# Patient Record
Sex: Female | Born: 1960 | Race: White | Hispanic: No | Marital: Single | Smoking: Never smoker
Health system: Southern US, Community
[De-identification: ages and names within clinical notes are randomized; demographics above are authoritative.]

## PROBLEM LIST (undated history)

## (undated) DIAGNOSIS — F329 Major depressive disorder, single episode, unspecified: Secondary | ICD-10-CM

## (undated) DIAGNOSIS — J040 Acute laryngitis: Secondary | ICD-10-CM

## (undated) DIAGNOSIS — M1990 Unspecified osteoarthritis, unspecified site: Secondary | ICD-10-CM

## (undated) DIAGNOSIS — I517 Cardiomegaly: Secondary | ICD-10-CM

## (undated) DIAGNOSIS — R091 Pleurisy: Secondary | ICD-10-CM

## (undated) DIAGNOSIS — Z8669 Personal history of other diseases of the nervous system and sense organs: Secondary | ICD-10-CM

## (undated) DIAGNOSIS — E785 Hyperlipidemia, unspecified: Secondary | ICD-10-CM

## (undated) DIAGNOSIS — H9191 Unspecified hearing loss, right ear: Secondary | ICD-10-CM

## (undated) DIAGNOSIS — R42 Dizziness and giddiness: Secondary | ICD-10-CM

## (undated) DIAGNOSIS — G4733 Obstructive sleep apnea (adult) (pediatric): Secondary | ICD-10-CM

## (undated) DIAGNOSIS — S92911A Unspecified fracture of right toe(s), initial encounter for closed fracture: Secondary | ICD-10-CM

## (undated) DIAGNOSIS — T7840XA Allergy, unspecified, initial encounter: Secondary | ICD-10-CM

## (undated) DIAGNOSIS — IMO0001 Reserved for inherently not codable concepts without codable children: Secondary | ICD-10-CM

## (undated) DIAGNOSIS — M545 Low back pain, unspecified: Secondary | ICD-10-CM

## (undated) DIAGNOSIS — R531 Weakness: Secondary | ICD-10-CM

## (undated) DIAGNOSIS — Z9889 Other specified postprocedural states: Secondary | ICD-10-CM

## (undated) DIAGNOSIS — M62838 Other muscle spasm: Secondary | ICD-10-CM

## (undated) DIAGNOSIS — G8929 Other chronic pain: Secondary | ICD-10-CM

## (undated) DIAGNOSIS — Z9989 Dependence on other enabling machines and devices: Secondary | ICD-10-CM

## (undated) DIAGNOSIS — R0602 Shortness of breath: Secondary | ICD-10-CM

## (undated) DIAGNOSIS — G459 Transient cerebral ischemic attack, unspecified: Secondary | ICD-10-CM

## (undated) DIAGNOSIS — K449 Diaphragmatic hernia without obstruction or gangrene: Secondary | ICD-10-CM

## (undated) DIAGNOSIS — F32A Depression, unspecified: Secondary | ICD-10-CM

## (undated) DIAGNOSIS — F3181 Bipolar II disorder: Secondary | ICD-10-CM

## (undated) DIAGNOSIS — K589 Irritable bowel syndrome without diarrhea: Secondary | ICD-10-CM

## (undated) DIAGNOSIS — H9190 Unspecified hearing loss, unspecified ear: Secondary | ICD-10-CM

## (undated) DIAGNOSIS — N201 Calculus of ureter: Secondary | ICD-10-CM

## (undated) DIAGNOSIS — G4700 Insomnia, unspecified: Secondary | ICD-10-CM

## (undated) DIAGNOSIS — B349 Viral infection, unspecified: Secondary | ICD-10-CM

## (undated) DIAGNOSIS — F419 Anxiety disorder, unspecified: Secondary | ICD-10-CM

## (undated) DIAGNOSIS — R112 Nausea with vomiting, unspecified: Secondary | ICD-10-CM

## (undated) DIAGNOSIS — R350 Frequency of micturition: Secondary | ICD-10-CM

## (undated) DIAGNOSIS — F988 Other specified behavioral and emotional disorders with onset usually occurring in childhood and adolescence: Secondary | ICD-10-CM

## (undated) DIAGNOSIS — J189 Pneumonia, unspecified organism: Secondary | ICD-10-CM

## (undated) DIAGNOSIS — Z8781 Personal history of (healed) traumatic fracture: Secondary | ICD-10-CM

## (undated) HISTORY — PX: COLONOSCOPY: SHX174

## (undated) HISTORY — DX: Dependence on other enabling machines and devices: Z99.89

## (undated) HISTORY — PX: OTHER SURGICAL HISTORY: SHX169

## (undated) HISTORY — PX: COCHLEAR IMPLANT: SUR684

## (undated) HISTORY — PX: UPPER GASTROINTESTINAL ENDOSCOPY: SHX188

## (undated) HISTORY — DX: Depression, unspecified: F32.A

## (undated) HISTORY — DX: Hyperlipidemia, unspecified: E78.5

## (undated) HISTORY — PX: ELBOW FRACTURE SURGERY: SHX616

## (undated) HISTORY — DX: Anxiety disorder, unspecified: F41.9

## (undated) HISTORY — DX: Diaphragmatic hernia without obstruction or gangrene: K44.9

## (undated) HISTORY — PX: ABDOMINAL HYSTERECTOMY: SHX81

## (undated) HISTORY — DX: Major depressive disorder, single episode, unspecified: F32.9

## (undated) HISTORY — PX: BUNIONECTOMY: SHX129

## (undated) HISTORY — PX: ABDOMINAL HERNIA REPAIR: SHX539

## (undated) HISTORY — DX: Obstructive sleep apnea (adult) (pediatric): G47.33

## (undated) HISTORY — DX: Allergy, unspecified, initial encounter: T78.40XA

---

## 1998-08-25 ENCOUNTER — Inpatient Hospital Stay (HOSPITAL_COMMUNITY): Admission: AD | Admit: 1998-08-25 | Discharge: 1998-08-27 | Payer: Self-pay | Admitting: Gynecology

## 2000-03-08 ENCOUNTER — Emergency Department (HOSPITAL_COMMUNITY): Admission: EM | Admit: 2000-03-08 | Discharge: 2000-03-08 | Payer: Self-pay | Admitting: Emergency Medicine

## 2000-04-11 ENCOUNTER — Ambulatory Visit (HOSPITAL_COMMUNITY): Admission: RE | Admit: 2000-04-11 | Discharge: 2000-04-11 | Payer: Self-pay | Admitting: Internal Medicine

## 2001-11-09 ENCOUNTER — Emergency Department (HOSPITAL_COMMUNITY): Admission: EM | Admit: 2001-11-09 | Discharge: 2001-11-09 | Payer: Self-pay | Admitting: Emergency Medicine

## 2001-11-09 ENCOUNTER — Encounter: Payer: Self-pay | Admitting: Emergency Medicine

## 2006-01-30 ENCOUNTER — Emergency Department (HOSPITAL_COMMUNITY): Admission: EM | Admit: 2006-01-30 | Discharge: 2006-01-30 | Payer: Self-pay | Admitting: Emergency Medicine

## 2007-06-30 ENCOUNTER — Emergency Department (HOSPITAL_COMMUNITY): Admission: EM | Admit: 2007-06-30 | Discharge: 2007-06-30 | Payer: Self-pay | Admitting: Emergency Medicine

## 2007-07-01 DIAGNOSIS — Z8781 Personal history of (healed) traumatic fracture: Secondary | ICD-10-CM

## 2007-07-01 HISTORY — DX: Personal history of (healed) traumatic fracture: Z87.81

## 2007-07-02 ENCOUNTER — Ambulatory Visit (HOSPITAL_COMMUNITY): Admission: RE | Admit: 2007-07-02 | Discharge: 2007-07-02 | Payer: Self-pay | Admitting: Otolaryngology

## 2009-06-17 ENCOUNTER — Encounter (INDEPENDENT_AMBULATORY_CARE_PROVIDER_SITE_OTHER): Payer: Self-pay | Admitting: Gynecology

## 2009-06-17 ENCOUNTER — Ambulatory Visit (HOSPITAL_BASED_OUTPATIENT_CLINIC_OR_DEPARTMENT_OTHER): Admission: RE | Admit: 2009-06-17 | Discharge: 2009-06-18 | Payer: Self-pay | Admitting: Gynecology

## 2010-04-04 ENCOUNTER — Encounter: Admission: RE | Admit: 2010-04-04 | Discharge: 2010-04-04 | Payer: Self-pay | Admitting: Gynecology

## 2010-04-18 DIAGNOSIS — F329 Major depressive disorder, single episode, unspecified: Secondary | ICD-10-CM | POA: Insufficient documentation

## 2010-04-18 DIAGNOSIS — F900 Attention-deficit hyperactivity disorder, predominantly inattentive type: Secondary | ICD-10-CM | POA: Insufficient documentation

## 2010-04-18 DIAGNOSIS — N3010 Interstitial cystitis (chronic) without hematuria: Secondary | ICD-10-CM | POA: Insufficient documentation

## 2010-04-18 DIAGNOSIS — G4700 Insomnia, unspecified: Secondary | ICD-10-CM | POA: Insufficient documentation

## 2010-04-18 DIAGNOSIS — K449 Diaphragmatic hernia without obstruction or gangrene: Secondary | ICD-10-CM | POA: Insufficient documentation

## 2010-04-18 DIAGNOSIS — K589 Irritable bowel syndrome without diarrhea: Secondary | ICD-10-CM | POA: Insufficient documentation

## 2010-05-12 DIAGNOSIS — R209 Unspecified disturbances of skin sensation: Secondary | ICD-10-CM | POA: Insufficient documentation

## 2011-01-08 ENCOUNTER — Encounter (HOSPITAL_BASED_OUTPATIENT_CLINIC_OR_DEPARTMENT_OTHER): Payer: Self-pay | Admitting: Internal Medicine

## 2011-02-23 ENCOUNTER — Encounter: Payer: Self-pay | Admitting: Internal Medicine

## 2011-02-28 NOTE — Letter (Signed)
Summary: Pre Visit Letter Revised  [facility]  [address] [place], [place] [address]   Phone: [phone]  Fax: [phone]        02/23/2011 MRN: [id number]  [facility] [address] [place], [place]  [address]             Procedure Date:  03-31-11 10:30am           Dr Juanda Chance  Direct Colon   Welcome to the Gastroenterology Division at [facility].    You are scheduled to see a nurse for your pre-procedure visit on 03-17-11 at 10am on the 3rd floor at [facility], [address].  We ask that you try to arrive at our office 15 minutes prior to your appointment time to allow for check-in.  Please take a minute to review the attached form.  If you answer "Yes" to one or more of the questions on the first page, we ask that you call the person listed at your earliest opportunity.  If you answer "No" to all of the questions, please complete the rest of the form and bring it to your appointment.    Your nurse visit will consist of discussing your medical and surgical history, your immediate family medical history, and your medications.   If you are unable to list all of your medications on the form, please bring the medication bottles to your appointment and we will list them.  We will need to be aware of both prescribed and over the counter drugs.  We will need to know exact dosage information as well.    Please be prepared to read and sign documents such as consent forms, a financial agreement, and acknowledgement forms.  If necessary, and with your consent, a friend or relative is welcome to sit-in on the nurse visit with you.  Please bring your insurance card so that we may make a copy of it.  If your insurance requires a referral to see a specialist, please bring your referral form from your primary care physician.  No co-pay is required for this nurse visit.     If you cannot keep your appointment, please call [phone] to cancel or reschedule  prior to your appointment date.  This allows [place] the opportunity to schedule an appointment for another patient in need of care.    Thank you for choosing [facility] for your medical needs.  We appreciate the opportunity to care for you.  Please visit [place] at our website  to learn more about our practice.  Sincerely, The Gastroenterology Division

## 2011-03-29 ENCOUNTER — Ambulatory Visit (AMBULATORY_SURGERY_CENTER): Payer: BLUE CROSS/BLUE SHIELD | Admitting: *Deleted

## 2011-03-29 VITALS — Ht 68.0 in | Wt 197.6 lb

## 2011-03-29 DIAGNOSIS — Z800 Family history of malignant neoplasm of digestive organs: Secondary | ICD-10-CM

## 2011-03-29 MED ORDER — PEG-KCL-NACL-NASULF-NA ASC-C 100 G PO SOLR: 1.0000 | Freq: Once | ORAL | Status: AC

## 2011-03-31 ENCOUNTER — Other Ambulatory Visit: Payer: Self-pay | Admitting: Internal Medicine

## 2011-04-07 ENCOUNTER — Encounter: Payer: Self-pay | Admitting: Internal Medicine

## 2011-04-10 ENCOUNTER — Ambulatory Visit (AMBULATORY_SURGERY_CENTER): Payer: BLUE CROSS/BLUE SHIELD | Admitting: Internal Medicine

## 2011-04-10 ENCOUNTER — Encounter: Payer: Self-pay | Admitting: Internal Medicine

## 2011-04-10 VITALS — BP 135/84 | HR 57 | Temp 98.6°F | Resp 16 | Ht 68.0 in | Wt 190.0 lb

## 2011-04-10 DIAGNOSIS — Z1211 Encounter for screening for malignant neoplasm of colon: Secondary | ICD-10-CM

## 2011-04-10 MED ORDER — SODIUM CHLORIDE 0.9 % IV SOLN: 500.0000 mL | INTRAVENOUS | Status: DC

## 2011-04-10 NOTE — Patient Instructions (Signed)
DISCHARGED INSTRUCTIONS GIVEN WITH VERBAL understanding. Normal examination. Resume previous medications.

## 2011-04-11 ENCOUNTER — Telehealth: Payer: Self-pay | Admitting: *Deleted

## 2011-04-11 NOTE — Telephone Encounter (Signed)
Left message on cell # to call if needs anything.
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
## 2011-05-02 NOTE — Op Note (Signed)
Natalie Le, Natalie Le              ACCOUNT NO.:  [id number]   MEDICAL RECORD NO.:  [id number]          PATIENT TYPE:  AMB   LOCATION:  NESC                         FACILITY:  [facility]   PHYSICIAN:  Gretta Cool, M.D. DATE OF BIRTH:  1961/10/21   DATE OF PROCEDURE:  06/17/2009  DATE OF DISCHARGE:                               OPERATIVE REPORT   PREOPERATIVE DIAGNOSIS:  Uterine leiomyomata recurrent post myomectomy  with abnormal uterine bleeding.   POSTOPERATIVE DIAGNOSES:  Uterine leiomyomata recurrent post myomectomy  with abnormal uterine bleeding  plus significant ovarian adhesions and minimal endometriosis  Involving the peritubal area on the left and a cul-de-sac.   PROCEDURE:  Supracervical hysterectomy including all of the endocervical  canal and bilateral salpingo-oophorectomy.   SURGEON:  Gretta Cool, M.D.   ASSISTANT:  Miguel Aschoff, M.D.   INDICATION:  A 50 year old perimenopausal female with menses varying  from 2 to 10 days, occasionally associated with extremely heavy flow in  clots.  Her uterus is enlarged now to approximately 12 to 14 weeks size  with multiple fibroids encroaching the cavity.  She also has extensive  areas that appear to be adenomyosis with significant premenstrual pain  on Saturday prior to menses.  She is now admitted for definitive therapy  by supracervical hysterectomy, possible salpingo-oophorectomy.  She  understands the risk of adhesions from her previous surgery involving  the ovaries, endometriosis often associated with interstitial cystitis  that she has been diagnosed with, now admitted for definitive therapy.   ANESTHESIA:  General orotracheal.   DESCRIPTION OF PROCEDURE:  Under excellent general anesthesia  orotracheal with the patient prepped in the supine position with  compression stockings on her legs and a Foley catheter draining her  bladder, a Pfannenstiel incision was made by excision of her previous  scar.  The  incision was then extended through the fascia.  The rectus  muscles were separated in the midline and the peritoneum opened.  There  were no abnormalities identified in the peritoneal cavity.  Her appendix  was retrocecal, not removed.  Both kidneys, gallbladder and liver  appeared normal.  There were significant adhesions from her ovary to the  posterior aspect of her uterus along with fallopian tube.  There was  minimal endometriosis involving the left fallopian tube and cul-de-sac.  The right ovary appeared small and nonfunctional.  At this point,  decision was made to proceed with supracervical hysterectomy and  bilateral salpingo-oophorectomy.  The round ligaments were transected by  cautery.  The infundibulopelvic ligaments were then opened, clamped with  Masterson clamps, transected and doubly ligated with 0 Vicryl.  At this  point, the bladder was mobilized off the uterine segment.  The broad  ligament was also skeletonized.  The uterine vessels were then clamped,  cut, sutured and tied with 0 Vicryl.  The upper portion of the current  ligament was then also clamped, cut, sutured and tied with 0 Vicryl.  At  this point, a conical incision was made in the cervix, and a  supracervical hysterectomy done conserving the fascia and stroma of the  cervix  with virtually complete excision of the endocervical canal.  At  this point, the cervix was closed with a running suture of 0 Vicryl.  At  this point, all the bleeding was dried and controlled.  At this point,  the pelvis was irrigated with lactated Ringer's and then the peritoneum  of the pelvis reapproximated with running suture of 2-0 Monocryl.  At  this point, the packs and retractors were removed.  The abdominal  peritoneum was closed with running suture of 0 Vicryl and Monocryl.  The  rectus muscle was then ligated in the midline also with 0 Monocryl.  At  this point, the fascia was closed from each angle of the midline with a   running suture of #0 Vicryl.  Subcutaneous tissue was approximated with  interrupted sutures of 3-0 Vicryl and the skin closed with skin staples  and Steri-Strips.  At the end of the procedure, sponge and lap counts  were correct.  There were no complications.  The patient was returned to  the recovery room in excellent condition.           ______________________________  Gretta Cool, M.D.     CWL/MEDQ  D:  06/17/2009  T:  06/17/2009  Job:  [id number]   cc:   Dr. Merry Lofty, M.D.

## 2011-05-03 ENCOUNTER — Other Ambulatory Visit: Payer: Self-pay | Admitting: Gynecology

## 2011-06-12 DIAGNOSIS — H9190 Unspecified hearing loss, unspecified ear: Secondary | ICD-10-CM | POA: Insufficient documentation

## 2011-06-19 ENCOUNTER — Other Ambulatory Visit: Payer: Self-pay | Admitting: Otolaryngology

## 2011-06-24 ENCOUNTER — Ambulatory Visit
Admission: RE | Admit: 2011-06-24 | Discharge: 2011-06-24 | Disposition: A | Discharge status: Home / Self Care | Payer: BLUE CROSS/BLUE SHIELD | Source: Ambulatory Visit | Attending: Otolaryngology | Admitting: Otolaryngology

## 2011-06-24 MED ORDER — GADOBENATE DIMEGLUMINE 529 MG/ML IV SOLN: 20.0000 mL | Freq: Once | INTRAVENOUS | Status: AC | PRN

## 2011-07-13 ENCOUNTER — Other Ambulatory Visit (HOSPITAL_COMMUNITY): Payer: Self-pay | Admitting: Otolaryngology

## 2011-07-13 DIAGNOSIS — Q211 Atrial septal defect: Secondary | ICD-10-CM

## 2011-07-14 ENCOUNTER — Other Ambulatory Visit (HOSPITAL_COMMUNITY): Payer: BLUE CROSS/BLUE SHIELD | Admitting: Radiology

## 2011-07-27 ENCOUNTER — Other Ambulatory Visit: Payer: Self-pay | Admitting: Internal Medicine

## 2011-07-27 DIAGNOSIS — N63 Unspecified lump in unspecified breast: Secondary | ICD-10-CM

## 2011-08-03 ENCOUNTER — Ambulatory Visit
Admission: RE | Admit: 2011-08-03 | Discharge: 2011-08-03 | Disposition: A | Discharge status: Home / Self Care | Payer: BLUE CROSS/BLUE SHIELD | Source: Ambulatory Visit | Attending: Internal Medicine | Admitting: Internal Medicine

## 2011-08-03 ENCOUNTER — Other Ambulatory Visit: Payer: Self-pay | Admitting: Internal Medicine

## 2011-08-03 DIAGNOSIS — N63 Unspecified lump in unspecified breast: Secondary | ICD-10-CM

## 2011-10-02 LAB — CBC
HCT: 37.9
Hemoglobin: 12.6
MCHC: 33.4
RDW: 14.7 — ABNORMAL HIGH

## 2012-04-09 ENCOUNTER — Observation Stay (HOSPITAL_COMMUNITY): Payer: BC Managed Care – PPO

## 2012-04-09 ENCOUNTER — Observation Stay (HOSPITAL_COMMUNITY)
Admission: EM | Admit: 2012-04-09 | Discharge: 2012-04-10 | Disposition: A | Discharge status: Home / Self Care | Payer: BC Managed Care – PPO | Attending: Emergency Medicine | Admitting: Emergency Medicine

## 2012-04-09 ENCOUNTER — Encounter (HOSPITAL_COMMUNITY): Payer: Self-pay | Admitting: *Deleted

## 2012-04-09 DIAGNOSIS — M542 Cervicalgia: Secondary | ICD-10-CM

## 2012-04-09 DIAGNOSIS — R6884 Jaw pain: Secondary | ICD-10-CM

## 2012-04-09 DIAGNOSIS — R42 Dizziness and giddiness: Secondary | ICD-10-CM

## 2012-04-09 DIAGNOSIS — F3189 Other bipolar disorder: Secondary | ICD-10-CM | POA: Insufficient documentation

## 2012-04-09 DIAGNOSIS — F411 Generalized anxiety disorder: Secondary | ICD-10-CM | POA: Insufficient documentation

## 2012-04-09 DIAGNOSIS — R0602 Shortness of breath: Secondary | ICD-10-CM | POA: Insufficient documentation

## 2012-04-09 DIAGNOSIS — I959 Hypotension, unspecified: Principal | ICD-10-CM | POA: Insufficient documentation

## 2012-04-09 HISTORY — DX: Unspecified hearing loss, unspecified ear: H91.90

## 2012-04-09 HISTORY — DX: Bipolar II disorder: F31.81

## 2012-04-09 HISTORY — DX: Reserved for inherently not codable concepts without codable children: IMO0001

## 2012-04-09 LAB — CARDIAC PANEL(CRET KIN+CKTOT+MB+TROPI)
CK, MB: 1.4 ng/mL (ref 0.3–4.0)
Relative Index: INVALID (ref 0.0–2.5)
Relative Index: INVALID (ref 0.0–2.5)
Total CK: 76 U/L (ref 7–177)
Total CK: 68 U/L (ref 7–177)
Troponin I: <0.3 ng/mL (ref <0.30)

## 2012-04-09 LAB — BASIC METABOLIC PANEL
Calcium: 9.4 mg/dL (ref 8.4–10.5)
GFR calc non Af Amer: 79 mL/min — ABNORMAL LOW (ref >90)
Sodium: 138 mEq/L (ref 135–145)

## 2012-04-09 LAB — POCT I-STAT TROPONIN I: Troponin i, poc: 0 ng/mL (ref 0.00–0.08)

## 2012-04-09 LAB — CBC
MCH: 30.2 pg (ref 26.0–34.0)
MCHC: 33.6 g/dL (ref 30.0–36.0)
Platelets: 305 10*3/uL (ref 150–400)

## 2012-04-09 MED ORDER — ASPIRIN 325 MG PO TABS
325.0000 mg | ORAL_TABLET | ORAL | Status: AC
  Administered 2012-04-09: 325 mg via ORAL
  Filled 2012-04-09: qty 1

## 2012-04-09 NOTE — ED Provider Notes (Signed)
6:32 PM Patient is in CDU under observation, chest pain protocol. This is a shared visit with Dr Patria Mane. Patient reports she is feeling well at present, did develop nausea during my exam. States she has had multiple symptoms over the past several months including unexplained SOB, weakness, right jaw and right neck pain, dizziness. Pt has also had "tightness" around the base of her neck, upper chest. On exam, pt is A&Ox4, NAD, RRR, no m/r/g, CTAB, abd soft, NT, extremities without edema, distal pulses intact and equal bilaterally. Patient is to have coronary CT in the morning. Patient verbalizes understanding and agrees with plan. PCP Dr Eloise Harman, [facility].    11:09 PM Patient reports she has been intermittently dizzy but denies CP, SOB.  On exam, pt is A&Ox4, NAD, RRR, no m/r/g, CTAB, abd soft, NT, extremities without edema, distal pulses intact and equal bilaterally.  Plan is for Coronary CT in the morning.  PCP is Dr Eloise Harman, [facility].    11:49 PM Patient signed out to Dr Hyman Hopes (blue side) who assumes care of her overnight.  Patient to have Coronary CT in the morning.    Dillard Cannon [place], [place] 04/09/12 [id number]

## 2012-04-09 NOTE — ED Notes (Signed)
Patient reports she had elevated heartrate and low bp while at the md office.  She states last night she was dizzy with nausea and headache.  Patient states she had shoulder and neck pain.  Patient states she had intermittent chest pain as well.  Patient was seen at her ear doctor and sent to ED for further eval

## 2012-04-09 NOTE — ED Notes (Signed)
PT BMI IS 27.4

## 2012-04-09 NOTE — ED Notes (Signed)
Pt st's she went to MD's office for a follow up visit and was told to come to ED ref. Hypotension.  Pt denies any chest pain now but st's she has had some off and on

## 2012-04-09 NOTE — ED Provider Notes (Signed)
History   This chart was scribed for Lyanne Co, MD by Clarita Crane. The patient was seen in room CDU10/CDU10. Patient's care was started at 1408.    CSN: [id number]  Arrival date & time 04/09/12  1408   None     Chief Complaint  Patient presents with  . Hypotension    (Consider location/radiation/quality/duration/timing/severity/associated sxs/prior treatment) HPI Natalie Le is a 51 y.o. female who presents to the Emergency Department complaining of intermittent episodes of right sided jaw pain and neck pain with associated intermittent dizziness described as disequilibrium, fatigue onset last night and persistent since. Patient notes she was being evaluated by PCP this morning and was referred to ED by PCP when she began experiencing a brief episode of SOB and chest pain while in office. Patient states she had multiple blood pressure measurements taken at that time which were elevated and multiple measurements which exhibited hypotension. Denies numbness, tingling, diaphoresis, nausea, vomiting. Patient is a non-smoker.  Past Medical History  Diagnosis Date  . Allergy   . Anxiety   . Depression   . Hiatal hernia   . Hearing impaired   . Bipolar 2 disorder     Past Surgical History  Procedure Date  . Colonoscopy   . Abdominal hysterectomy   . Bunionectomy   . Fx nose repair   . Elbow fracture surgery   . Abdominal hernia repair   . Bladder  stretch   . Upper gastrointestinal endoscopy     Family History  Problem Relation Age of Onset  . Colon cancer Maternal Grandfather     History  Substance Use Topics  . Smoking status: Never Smoker   . Smokeless tobacco: Never Used  . Alcohol Use: No    OB History    Grav Para Term Preterm Abortions TAB SAB Ect Mult Living                  Review of Systems  Constitutional: Positive for fatigue. Negative for fever and chills.  HENT: Positive for neck pain.        Jaw pain.   Respiratory: Positive for  shortness of breath.   Cardiovascular: Positive for chest pain.  Gastrointestinal: Negative for nausea and vomiting.  Neurological: Positive for dizziness.    Allergies  Demerol and Lamictal  Home Medications   Current Outpatient Rx  Name Route Sig Dispense Refill  . AMPHETAMINE-DEXTROAMPHET ER 30 MG PO CP24 Oral Take 30 mg by mouth every morning.    . ASPIRIN EC 81 MG PO TBEC Oral Take 81 mg by mouth daily.    Marland Kitchen CALCIUM CARBONATE-VITAMIN D 500-200 MG-UNIT PO TABS Oral Take 2 tablets by mouth 2 (two) times daily.     Marland Kitchen FLUOXETINE HCL 20 MG PO TABS Oral Take 20 mg by mouth every morning.    . TOPIRAMATE 25 MG PO CPSP Oral Take 50 mg by mouth at bedtime.     . TRAZODONE HCL 50 MG PO TABS Oral Take 12.5-25 mg by mouth at bedtime.       BP 129/92  Pulse 82  Temp(Src) 97.6 F (36.4 C) (Oral)  Resp 18  Ht 5' 7.5" (1.715 m)  Wt 169 lb (76.658 kg)  BMI 26.08 kg/m2  SpO2 98%  Physical Exam  Nursing note and vitals reviewed. Constitutional: She is oriented to person, place, and time. She appears well-developed and well-nourished. No distress.  HENT:  Head: Normocephalic and atraumatic.  Eyes: Conjunctivae and EOM are normal.  Pupils are equal, round, and reactive to light.  Neck: Neck supple. No tracheal deviation present.  Cardiovascular: Normal rate, regular rhythm and normal heart sounds.  Exam reveals no gallop and no friction rub.   No murmur heard. Pulmonary/Chest: Effort normal and breath sounds normal. No respiratory distress. She has no wheezes. She has no rales.  Abdominal: Soft. She exhibits no distension.  Musculoskeletal: Normal range of motion.  Neurological: She is alert and oriented to person, place, and time. She has normal strength. No sensory deficit. She displays no seizure activity.  Skin: Skin is warm and dry.  Psychiatric: Her behavior is normal. Her mood appears anxious.    ED Course  Procedures (including critical care time)  DIAGNOSTIC  STUDIES: Oxygen Saturation is 98% on room air, normal by my interpretation.     Date: 04/09/2012  Rate: 67  Rhythm: normal sinus rhythm  QRS Axis: normal  Intervals: normal  ST/T Wave abnormalities: normal  Conduction Disutrbances:none  Narrative Interpretation: Normal EKG  Old EKG Reviewed: none available   COORDINATION OF CARE: 4:12PM-Patient informed of current plan for treatment and evaluation and agrees with plan at this time.     Labs Reviewed  BASIC METABOLIC PANEL - Abnormal; Notable for the following:    GFR calc non Af Amer 79 (*)    All other components within normal limits  CBC  POCT I-STAT TROPONIN I  CARDIAC PANEL(CRET KIN+CKTOT+MB+TROPI)  CARDIAC PANEL(CRET KIN+CKTOT+MB+TROPI)   Dg Chest 2 View  04/09/2012  *RADIOLOGY REPORT*  Clinical Data: Chest pain and dizziness.  Fatigue  CHEST - 2 VIEW  Comparison: None  Findings: The heart size and mediastinal contours are within normal limits.  Both lungs are clear.  The visualized skeletal structures are unremarkable.  IMPRESSION: Negative exam.  Original Report Authenticated By: Rosealee Albee, M.D.     No diagnosis found.    MDM  The patient's symptoms of intermittent right-sided neck pain and jaw pain as well as shortness of breath may represent angina.  She reports normal stress test before in the past.  CT angiogram only obtained in the morning after serial markers through the night.      I personally performed the services described in this documentation, which was scribed in my presence. The recorded information has been reviewed and considered.      Lyanne Co, MD 04/09/12 [phone]

## 2012-04-10 ENCOUNTER — Observation Stay (HOSPITAL_COMMUNITY)
Admit: 2012-04-10 | Discharge: 2012-04-10 | Disposition: A | Discharge status: Home / Self Care | Payer: BC Managed Care – PPO | Attending: Emergency Medicine | Admitting: Emergency Medicine

## 2012-04-10 MED ORDER — METOPROLOL TARTRATE 25 MG PO TABS
50.0000 mg | ORAL_TABLET | Freq: Once | ORAL | Status: AC
  Administered 2012-04-10: 50 mg via ORAL

## 2012-04-10 MED ORDER — NITROGLYCERIN 0.4 MG SL SUBL
0.4000 mg | SUBLINGUAL_TABLET | Freq: Once | SUBLINGUAL | Status: AC
  Administered 2012-04-10: 0.4 mg via SUBLINGUAL

## 2012-04-10 MED ORDER — IOHEXOL 350 MG/ML SOLN
80.0000 mL | Freq: Once | INTRAVENOUS | Status: AC | PRN
  Administered 2012-04-10: 80 mL via INTRAVENOUS

## 2012-04-10 MED ORDER — NITROGLYCERIN 0.4 MG SL SUBL
SUBLINGUAL_TABLET | SUBLINGUAL | Status: AC
  Filled 2012-04-10: qty 25

## 2012-04-10 NOTE — ED Provider Notes (Signed)
I was the primary provider of this patient during this ER visit. The patients care was continued in the CDU and managed in conjunction with my midlevel providers   Lyanne Co, MD 04/10/12 [phone]

## 2012-04-10 NOTE — ED Provider Notes (Signed)
7:20 AM Patient  placed in CDU on chest pain protocol by Dr. Patria Mane. Patient care resumed from Dr. Hyman Hopes .  Patient is awaiting CT coronary arteries this morning.   While in obeservation over night the pt slept well and had no complaints, per nursing staff. Patient re-evaluated and is resting comfortable, VSS, with no new complaints or concerns at this time. Patient denies chest pain, SOB, jaw pain, or dizziness at this time.  Plan per previous provider is to discharge patient home if coronary artery CT is normal. On exam: hemodynamically stable, NAD, heart w/ RRR, lungs CTAB, Chest & abd non-tender, no peripheral edema or calf tenderness.   10:54 AM Patient asymptomatic at this time.  Results of the CT coronary as follows: 1. No atherosclerotic coronary artery disease.  2. Total coronary artery calcium score is zero , which is zero  percentile for patient's matched age and gender.  Will discharge patient home.  Patient instructed to follow up with PCP.  Patient in agreement with plan.     Natalie Le [place], PA-C 04/10/12 [phone]

## 2012-04-10 NOTE — Discharge Instructions (Signed)
Read instructions below for reasons to return to the Emergency Department. It is recommended that your follow up with your Primary Care Doctor in regards to today's visit. Follow up tomorrow as already scheduled.  If you do not have a doctor, use the resource guide listed below to help you find one.   Chest Pain (Nonspecific)  HOME CARE INSTRUCTIONS  For the next few days, avoid physical activities that bring on chest pain. Continue physical activities as directed.  Do not smoke cigarettes or drink alcohol until your symptoms are gone.  Only take over-the-counter or prescription medicine for pain, discomfort, or fever as directed by your caregiver.  Follow your caregiver's suggestions for further testing if your chest pain does not go away.  Keep any follow-up appointments you made. If you do not go to an appointment, you could develop lasting (chronic) problems with pain. If there is any problem keeping an appointment, you must call to reschedule.  SEEK MEDICAL CARE IF:  You think you are having problems from the medicine you are taking. Read your medicine instructions carefully.  Your chest pain does not go away, even after treatment.  You develop a rash with blisters on your chest.  SEEK IMMEDIATE MEDICAL CARE IF:  You have increased chest pain or pain that spreads to your arm, neck, jaw, back, or belly (abdomen).  You develop shortness of breath, an increasing cough, or you are coughing up blood.  You have severe back or abdominal pain, feel sick to your stomach (nauseous) or throw up (vomit).  You develop severe weakness, fainting, or chills.  You have an oral temperature above 102 F (38.9 C), not controlled by medicine.   THIS IS AN EMERGENCY. Do not wait to see if the pain will go away. Get medical help at once. Call your local emergency services (911 in [place] Do not drive yourself to the hospital.   RESOURCE GUIDE  Dental Problems  Patients with Medicaid: [facility] [phone] [address]                                           [phone] [address] Phone:  [phone]                                                  Phone:  [phone]  If unable to pay or uninsured, contact:  [facility] or [facility]. to become qualified for the adult dental clinic.  Chronic Pain Problems Contact [facility]  [phone] Patients need to be referred by their primary care doctor.  Insufficient Money for Medicine Contact [employer]:  call "211" or [facility] (312) 367-8459.  No Primary Care Doctor Call [facility]  (814)589-6416 Other agencies that provide inexpensive medical care    [facility]  [phone]    [facility]  8011414727    [facility]  (260) [phone]    [facility]  502-606-0536    [facility]  (301)712-3808    [facility]  (847)308-3774  Psychological Services [facility]  [phone] [facility]  (240)[phone] [facility] Mental Health   [phone] (emergency services (386)506-6400)  Substance Abuse Resources [facility] Services  [phone] [facility] (813) 471-4374 [facility] [place] 951 240 3910 Floydene Flock 807-343-1391 Residential & Outpatient Substance Abuse Program  351-127-5170  Abuse/Neglect [facility] Child Abuse Hotline 438-567-2665 [facility] Child Abuse Hotline 959-731-1722 (After Hours)  Emergency Shelter [facility] 737-391-4295  Maternity Homes [facility] at [facility] [place] [facility] [place] 480-826-1655 [facility] (740) 379-0313  MRSA Hotline #:   281-363-7446    [facility] Resources  [facility]     [employer]                          [facility] Dept. [address] [place]                       [address]      [address] [place] [address]  Blondell Reveal Phone:  [phone]                                   Phone:  [phone]                 Phone:  [phone]  [facility] Mental Health Phone:  [phone]  [facility] Child Abuse Hotline 830-126-1516 201-222-7286 (After Hours)

## 2012-04-11 DIAGNOSIS — R413 Other amnesia: Secondary | ICD-10-CM | POA: Insufficient documentation

## 2012-04-12 NOTE — Progress Notes (Signed)
Post d/c review done per insurance request. Natalie Le 04/12/2012  

## 2012-07-01 ENCOUNTER — Encounter (HOSPITAL_COMMUNITY): Payer: Self-pay | Admitting: Emergency Medicine

## 2012-07-01 ENCOUNTER — Observation Stay (HOSPITAL_COMMUNITY)
Admission: EM | Admit: 2012-07-01 | Discharge: 2012-07-01 | Disposition: A | Discharge status: Home / Self Care | Payer: BC Managed Care – PPO | Attending: Emergency Medicine | Admitting: Emergency Medicine

## 2012-07-01 ENCOUNTER — Emergency Department (HOSPITAL_COMMUNITY): Payer: BC Managed Care – PPO

## 2012-07-01 ENCOUNTER — Observation Stay (HOSPITAL_COMMUNITY): Payer: BC Managed Care – PPO

## 2012-07-01 DIAGNOSIS — I079 Rheumatic tricuspid valve disease, unspecified: Secondary | ICD-10-CM | POA: Insufficient documentation

## 2012-07-01 DIAGNOSIS — F411 Generalized anxiety disorder: Secondary | ICD-10-CM | POA: Insufficient documentation

## 2012-07-01 DIAGNOSIS — G459 Transient cerebral ischemic attack, unspecified: Secondary | ICD-10-CM

## 2012-07-01 DIAGNOSIS — H538 Other visual disturbances: Principal | ICD-10-CM | POA: Insufficient documentation

## 2012-07-01 DIAGNOSIS — F3189 Other bipolar disorder: Secondary | ICD-10-CM | POA: Insufficient documentation

## 2012-07-01 LAB — LIPID PANEL
Cholesterol: 222 mg/dL — ABNORMAL HIGH (ref 0–200)
HDL: 63 mg/dL (ref >39)
LDL Cholesterol: 138 mg/dL — ABNORMAL HIGH (ref 0–99)
Total CHOL/HDL Ratio: 3.5 RATIO
Triglycerides: 103 mg/dL (ref <150)
VLDL: 21 mg/dL (ref 0–40)

## 2012-07-01 LAB — HEPATIC FUNCTION PANEL
ALT: 17 U/L (ref 0–35)
AST: 18 U/L (ref 0–37)
Albumin: 3.7 g/dL (ref 3.5–5.2)
Alkaline Phosphatase: 86 U/L (ref 39–117)
Bilirubin, Direct: 0.1 mg/dL (ref 0.0–0.3)
Indirect Bilirubin: 0.6 mg/dL (ref 0.3–0.9)
Total Bilirubin: 0.7 mg/dL (ref 0.3–1.2)
Total Protein: 6.6 g/dL (ref 6.0–8.3)

## 2012-07-01 LAB — BASIC METABOLIC PANEL
CO2: 26 mEq/L (ref 19–32)
Calcium: 9.2 mg/dL (ref 8.4–10.5)
Chloride: 106 mEq/L (ref 96–112)
Creatinine, Ser: 0.85 mg/dL (ref 0.50–1.10)
GFR calc Af Amer: 90 mL/min — ABNORMAL LOW (ref >90)
Sodium: 142 mEq/L (ref 135–145)

## 2012-07-01 LAB — URINALYSIS, ROUTINE W REFLEX MICROSCOPIC
Glucose, UA: NEGATIVE mg/dL
Ketones, ur: NEGATIVE mg/dL
Leukocytes, UA: NEGATIVE
Nitrite: NEGATIVE
Protein, ur: NEGATIVE mg/dL
pH: 6 (ref 5.0–8.0)

## 2012-07-01 LAB — HEMOGLOBIN A1C: Hgb A1c MFr Bld: 5.7 % — ABNORMAL HIGH (ref <5.7)

## 2012-07-01 LAB — CBC WITH DIFFERENTIAL/PLATELET
Basophils Absolute: 0 10*3/uL (ref 0.0–0.1)
Basophils Relative: 0 % (ref 0–1)
Lymphocytes Relative: 28 % (ref 12–46)
MCHC: 33.8 g/dL (ref 30.0–36.0)
Neutro Abs: 3.7 10*3/uL (ref 1.7–7.7)
Platelets: 265 10*3/uL (ref 150–400)
RDW: 13.1 % (ref 11.5–15.5)
WBC: 6.2 10*3/uL (ref 4.0–10.5)

## 2012-07-01 LAB — APTT: aPTT: 31 seconds (ref 24–37)

## 2012-07-01 LAB — SEDIMENTATION RATE: Sed Rate: 8 mm/hr (ref 0–22)

## 2012-07-01 LAB — PROTIME-INR
INR: 0.98 (ref 0.00–1.49)
Prothrombin Time: 13.2 seconds (ref 11.6–15.2)

## 2012-07-01 MED ORDER — ASPIRIN 81 MG PO CHEW: 324.0000 mg | CHEWABLE_TABLET | Freq: Once | ORAL | Status: DC

## 2012-07-01 NOTE — Progress Notes (Signed)
Observation review is complete. 

## 2012-07-01 NOTE — Progress Notes (Signed)
VASCULAR LAB PRELIMINARY  PRELIMINARY  PRELIMINARY  PRELIMINARY Carotid duplex completed.    Preliminary report:  Bilateral:  No evidence of hemodynamically significant internal carotid artery stenosis.   Vertebral artery flow is antegrade.     Raif Chachere, RVS 07/01/2012, 11:04 AM

## 2012-07-01 NOTE — ED Notes (Signed)
Pt states that she was just here in march for her bp and that she has a tendency to panic alittle

## 2012-07-01 NOTE — ED Provider Notes (Signed)
History     CSN: [id number]  Arrival date & time 07/01/12  4098   First MD Initiated Contact with Patient 07/01/12 [phone]      Chief Complaint  Patient presents with  . Blurred Vision    (Consider location/radiation/quality/duration/timing/severity/associated sxs/prior treatment) The history is provided by the patient.  51 year old female woke up this morning and put in her contact lenses and noted that her vision was very blurred out of her left eye. She thought she put the contact lens in wrong and her dad reinserting it but her vision was still blurred. There is no pain or headache. Vision is slowly improving now. She denies any other symptoms. There is no facial droop, no difficulty speaking, no weakness or numbness. Of note, about one year ago, she had sudden onset of complete loss of hearing in her right ear with no cause ever having been found.  Past Medical History  Diagnosis Date  . Allergy   . Anxiety   . Depression   . Hiatal hernia   . Hearing impaired   . Bipolar 2 disorder     Past Surgical History  Procedure Date  . Colonoscopy   . Abdominal hysterectomy   . Bunionectomy   . Fx nose repair   . Elbow fracture surgery   . Abdominal hernia repair   . Bladder  stretch   . Upper gastrointestinal endoscopy     Family History  Problem Relation Age of Onset  . Colon cancer Maternal Grandfather     History  Substance Use Topics  . Smoking status: Never Smoker   . Smokeless tobacco: Never Used  . Alcohol Use: No    OB History    Grav Para Term Preterm Abortions TAB SAB Ect Mult Living                  Review of Systems  All other systems reviewed and are negative.    Allergies  Demerol; Topamax; and Lamictal  Home Medications   Current Outpatient Rx  Name Route Sig Dispense Refill  . AMPHETAMINE-DEXTROAMPHET ER 30 MG PO CP24 Oral Take 30 mg by mouth every morning.    . ASPIRIN EC 81 MG PO TBEC Oral Take 324 mg by mouth once.    Marland Kitchen FLUOXETINE  HCL 20 MG PO TABS Oral Take 20 mg by mouth every morning.    . TRAZODONE HCL 50 MG PO TABS Oral Take 12.5-25 mg by mouth at bedtime.       BP 123/80  Pulse 81  Temp 97.4 F (36.3 C) (Oral)  Resp 16  SpO2 98%  Physical Exam  Nursing note and vitals reviewed.  51 year old female who is resting comfortably and in no acute distress. Vital signs are normal. Oxygen saturation is 98% which is normal. Head is normocephalic and atraumatic. PERRLA, EOMI. Fundi are normal without pallor, hemorrhage, exudate, papilledema. Oropharynx is clear. Neck is nontender and supple without adenopathy or bruit. Back is nontender. Lungs are clear without rales, wheezes, rhonchi. Heart has regular rate and rhythm without murmur. Abdomen is soft, flat, nontender without masses or hepatosplenomegaly. Extremities have no cyanosis or edema, full range of motion is present. Skin is warm and dry without rash. Neurologic: Mental status is normal. Cranial nerves are intact with the exception of hearing loss in the right ear and blurred vision in the left eye. There are no motor or sensory deficits.  ED Course  Procedures (including critical care time)  Results for  orders placed during the hospital encounter of 07/01/12  CBC WITH DIFFERENTIAL      Component Value Range   WBC 6.2  4.0 - 10.5 K/uL   RBC 4.39  3.87 - 5.11 MIL/uL   Hemoglobin 13.2  12.0 - 15.0 g/dL   HCT 16.1  09.6 - 04.5 %   MCV 89.1  78.0 - 100.0 fL   MCH 30.1  26.0 - 34.0 pg   MCHC 33.8  30.0 - 36.0 g/dL   RDW 40.9  81.1 - 91.4 %   Platelets 265  150 - 400 K/uL   Neutrophils Relative 60  43 - 77 %   Neutro Abs 3.7  1.7 - 7.7 K/uL   Lymphocytes Relative 28  12 - 46 %   Lymphs Abs 1.7  0.7 - 4.0 K/uL   Monocytes Relative 9  3 - 12 %   Monocytes Absolute 0.6  0.1 - 1.0 K/uL   Eosinophils Relative 3  0 - 5 %   Eosinophils Absolute 0.2  0.0 - 0.7 K/uL   Basophils Relative 0  0 - 1 %   Basophils Absolute 0.0  0.0 - 0.1 K/uL  BASIC METABOLIC PANEL       Component Value Range   Sodium 142  135 - 145 mEq/L   Potassium 4.0  3.5 - 5.1 mEq/L   Chloride 106  96 - 112 mEq/L   CO2 26  19 - 32 mEq/L   Glucose, Bld 103 (*) 70 - 99 mg/dL   BUN 16  6 - 23 mg/dL   Creatinine, Ser 7.82  0.50 - 1.10 mg/dL   Calcium 9.2  8.4 - 95.6 mg/dL   GFR calc non Af Amer 77 (*) >90 mL/min   GFR calc Af Amer 90 (*) >90 mL/min  URINALYSIS, ROUTINE W REFLEX MICROSCOPIC      Component Value Range   Color, Urine YELLOW  YELLOW   APPearance CLEAR  CLEAR   Specific Gravity, Urine 1.021  1.005 - 1.030   pH 6.0  5.0 - 8.0   Glucose, UA NEGATIVE  NEGATIVE mg/dL   Hgb urine dipstick NEGATIVE  NEGATIVE   Bilirubin Urine NEGATIVE  NEGATIVE   Ketones, ur NEGATIVE  NEGATIVE mg/dL   Protein, ur NEGATIVE  NEGATIVE mg/dL   Urobilinogen, UA 0.2  0.0 - 1.0 mg/dL   Nitrite NEGATIVE  NEGATIVE   Leukocytes, UA NEGATIVE  NEGATIVE  SEDIMENTATION RATE      Component Value Range   Sed Rate 8  0 - 22 mm/hr  PROTIME-INR      Component Value Range   Prothrombin Time 13.2  11.6 - 15.2 seconds   INR 0.98  0.00 - 1.49  APTT      Component Value Range   aPTT 31  24 - 37 seconds  LIPID PANEL      Component Value Range   Cholesterol 222 (*) 0 - 200 mg/dL   Triglycerides 213  <086 mg/dL   HDL 63  >57 mg/dL   Total CHOL/HDL Ratio 3.5     VLDL 21  0 - 40 mg/dL   LDL Cholesterol 846 (*) 0 - 99 mg/dL  HEMOGLOBIN N6E      Component Value Range   Hemoglobin A1C 5.7 (*) <5.7 %   Mean Plasma Glucose 117 (*) <117 mg/dL  HEPATIC FUNCTION PANEL      Component Value Range   Total Protein 6.6  6.0 - 8.3 g/dL   Albumin 3.7  3.5 - 5.2 g/dL   AST 18  0 - 37 U/L   ALT 17  0 - 35 U/L   Alkaline Phosphatase 86  39 - 117 U/L   Total Bilirubin 0.7  0.3 - 1.2 mg/dL   Bilirubin, Direct 0.1  0.0 - 0.3 mg/dL   Indirect Bilirubin 0.6  0.3 - 0.9 mg/dL   Ct Head Wo Contrast  07/01/2012  *RADIOLOGY REPORT*  Clinical Data: Blurred vision upon waking.  CT HEAD WITHOUT CONTRAST  Technique:   Contiguous axial images were obtained from the base of the skull through the vertex without contrast.  Comparison: 06/24/2011.  06/30/2007  Findings: The brain has a normal appearance without evidence of atrophy, old or acute infarction, mass lesion, hemorrhage, hydrocephalus or extra-axial collection.  No calvarial abnormality. Sinuses, middle ears and mastoids are clear.  IMPRESSION: Normal head CT  Original Report Authenticated By: Thomasenia Sales, M.D.   Mr Brain Wo Contrast  07/01/2012  *RADIOLOGY REPORT*  Clinical Data: Blurred vision  MRI HEAD WITHOUT CONTRAST  Technique:  Multiplanar, multiecho pulse sequences of the brain and surrounding structures were obtained according to standard protocol without intravenous contrast.  Comparison: CT 07/01/2012, MRI 06/24/2011  Findings: Small periventricular white matter hyperintensities are unchanged.  Sub centimeter right frontal subcortical white matter hyperintensity also unchanged.  Diffusion weighted imaging is negative for acute infarct.  No cortical infarction.  Ventricle size is normal.  Negative for hemorrhage or mass lesion. Paranasal sinuses are clear.  IMPRESSION: Right frontal white matter hyperintensity is unchanged.  Mild periventricular white matter hyperintensity is unchanged.  These lesions are most likely due to chronic microvascular ischemia. Demyelinating disease considered less likely.  No acute abnormality.  Original Report Authenticated By: Camelia Phenes, M.D.     ECG shows normal sinus rhythm with a rate of 78, no ectopy. Normal axis. Normal P wave. Normal QRS. Normal intervals. Normal ST and T waves. Impression: normal ECG. When compared with ECG of 04/09/2012, no significant changes are seen.   1. Blurred vision, left eye       MDM  Acute monocular visual loss. This may represent a transient ischemic attack. Funduscopic exam does not show evidence of retinal artery or retinal vein occlusion. Consider possibility of multiple  sclerosis although lack of plaques being present on MRI one year ago would argue against that. Also need to consider possibility of temporal arteritis. She will undergo had TIA protocol testing. However, she had an MRA done 1 year ago for evaluation for sensorineural hearing loss, so that will not need to be repeated.  Reexam: Vision continues to improve but still not quite back to baseline. She is placed in CDU for TIA workup.  Dione Booze, MD 07/02/12 [phone]

## 2012-07-01 NOTE — ED Notes (Signed)
Pt states she had a  Brief sharp pain in her left eye that came and went briefly states she may also be getting a h/a possibly from stress

## 2012-07-01 NOTE — ED Notes (Signed)
Have spoken with mri. They advise will be around 30 min before they do pt study. Pt made aware

## 2012-07-01 NOTE — ED Provider Notes (Signed)
4540 report received from Dr. Preston Fleeting. Patient will be made to CDU on the TIA protocol. Patient had blurred vision upon awakening this morning. Pmh of hearing loss on the right one year ago. MRI pending, 2-D echo pending. Neuro intact. Vss. pmh of recent tic bite with doxy.    145pm   L eye blurred vision/ CT of the head and MRI of the brain are negative for stroke. TIA workup negative for anything acute.  Patient will follow up with opthamology asap.  Ready for discharge. Dr Jarold Motto for pcp followup for elevated LDL and cholesterol.        Remi Haggard, NP 07/02/12 1122

## 2012-07-01 NOTE — ED Notes (Signed)
Pt staes woke up w/ blurry vision this am is panicking due sudden loss of hearing last year of rt ear

## 2012-07-01 NOTE — ED Notes (Signed)
Pt states no h/a no drainage from eye left has had 2 tick bites in the past month and developed a rash just finished doxy she states

## 2012-07-01 NOTE — ED Notes (Signed)
Pt back in room from being out of the department; placed back on monitor, continuous pulse oximetry and blood pressure cuff; family at bedside

## 2012-07-01 NOTE — ED Notes (Signed)
Pt placed on monitor, continuous pulse oximetry and pulse oximetry; EKG performed; family at bedside

## 2012-07-01 NOTE — Progress Notes (Signed)
  Echocardiogram 2D Echocardiogram has been performed.  Natalie Le 07/01/2012, 11:21 AM

## 2012-07-02 NOTE — ED Provider Notes (Signed)
Medical screening examination/treatment/procedure(s) were conducted as a shared visit with non-physician practitioner(s) and myself.  I personally evaluated the patient during the encounter   Dione Booze, MD 07/02/12 [phone]

## 2012-07-17 ENCOUNTER — Other Ambulatory Visit: Payer: Self-pay | Admitting: Gynecology

## 2012-08-16 ENCOUNTER — Other Ambulatory Visit: Payer: Self-pay | Admitting: Internal Medicine

## 2012-08-16 DIAGNOSIS — Z1231 Encounter for screening mammogram for malignant neoplasm of breast: Secondary | ICD-10-CM

## 2012-08-27 ENCOUNTER — Other Ambulatory Visit: Payer: Self-pay | Admitting: Dermatology

## 2012-08-29 ENCOUNTER — Ambulatory Visit
Admission: RE | Admit: 2012-08-29 | Discharge: 2012-08-29 | Disposition: A | Discharge status: Home / Self Care | Payer: BC Managed Care – PPO | Source: Ambulatory Visit | Attending: Internal Medicine | Admitting: Internal Medicine

## 2012-08-29 DIAGNOSIS — Z1231 Encounter for screening mammogram for malignant neoplasm of breast: Secondary | ICD-10-CM

## 2012-08-30 ENCOUNTER — Ambulatory Visit: Payer: BC Managed Care – PPO

## 2012-12-25 DIAGNOSIS — E785 Hyperlipidemia, unspecified: Secondary | ICD-10-CM | POA: Insufficient documentation

## 2013-06-26 DIAGNOSIS — M542 Cervicalgia: Secondary | ICD-10-CM | POA: Insufficient documentation

## 2013-07-19 ENCOUNTER — Encounter (HOSPITAL_COMMUNITY): Payer: Self-pay | Admitting: Emergency Medicine

## 2013-07-19 ENCOUNTER — Emergency Department (HOSPITAL_COMMUNITY)
Admission: EM | Admit: 2013-07-19 | Discharge: 2013-07-19 | Disposition: A | Discharge status: Home / Self Care | Payer: BC Managed Care – PPO | Source: Home / Self Care | Attending: Family Medicine | Admitting: Family Medicine

## 2013-07-19 DIAGNOSIS — J069 Acute upper respiratory infection, unspecified: Secondary | ICD-10-CM

## 2013-07-19 MED ORDER — FLUTICASONE PROPIONATE 50 MCG/ACT NA SUSP
1.0000 | Freq: Two times a day (BID) | NASAL | Status: DC
Start: 2013-07-19 — End: 2013-10-09

## 2013-07-19 NOTE — ED Notes (Signed)
States she has a sore throat with red blisters in her mouth.  Patient states the pain woke her up last night.  Patient states it hurts to swallow.

## 2013-07-19 NOTE — ED Provider Notes (Signed)
CSN: [id number]     Arrival date & time 07/19/13  1028 History     First MD Initiated Contact with Patient 07/19/13 1036     Chief Complaint  Patient presents with  . Sore Throat   (Consider location/radiation/quality/duration/timing/severity/associated sxs/prior Treatment) Patient is a 52 y.o. female presenting with pharyngitis. The history is provided by the patient.  Sore Throat This is a new problem. The current episode started 6 to 12 hours ago (awoke with sx last eve, allergies have been bothersome recently.). The problem has been gradually worsening. Pertinent negatives include no chest pain, no abdominal pain, no headaches and no shortness of breath. The symptoms are aggravated by swallowing.    Past Medical History  Diagnosis Date  . Allergy   . Anxiety   . Depression   . Hiatal hernia   . Hearing impaired   . Bipolar 2 disorder    Past Surgical History  Procedure Laterality Date  . Colonoscopy    . Abdominal hysterectomy    . Bunionectomy    . Fx nose repair    . Elbow fracture surgery    . Abdominal hernia repair    . Bladder  stretch    . Upper gastrointestinal endoscopy     Family History  Problem Relation Age of Onset  . Colon cancer Maternal Grandfather    History  Substance Use Topics  . Smoking status: Never Smoker   . Smokeless tobacco: Never Used  . Alcohol Use: No   OB History   Grav Para Term Preterm Abortions TAB SAB Ect Mult Living                 Review of Systems  Constitutional: Negative.  Negative for fever.  HENT: Positive for congestion, sore throat, rhinorrhea and postnasal drip.   Respiratory: Negative for shortness of breath.   Cardiovascular: Negative for chest pain.  Gastrointestinal: Negative for abdominal pain.  Skin: Negative.   Neurological: Negative for headaches.    Allergies  Demerol; Topamax; and Lamictal  Home Medications   Current Outpatient Rx  Name  Route  Sig  Dispense  Refill  . amoxicillin (AMOXIL) 500  MG capsule   Oral   Take 500 mg by mouth 3 (three) times daily.         Marland Kitchen amphetamine-dextroamphetamine (ADDERALL XR) 30 MG 24 hr capsule   Oral   Take 30 mg by mouth every morning.         Marland Kitchen aspirin EC 81 MG tablet   Oral   Take 324 mg by mouth once.         Marland Kitchen FLUoxetine (PROZAC) 20 MG tablet   Oral   Take 20 mg by mouth every morning.         . fluticasone (FLONASE) 50 MCG/ACT nasal spray   Nasal   Place 1 spray into the nose 2 (two) times daily.   1 g   2   . traZODone (DESYREL) 50 MG tablet   Oral   Take 12.5-25 mg by mouth at bedtime.           BP 129/80  Pulse 88  Temp(Src) 97.6 F (36.4 C) (Oral)  Resp 18  SpO2 96%  LMP 12/18/2005 Physical Exam  Nursing note and vitals reviewed. Constitutional: She is oriented to person, place, and time. She appears well-developed and well-nourished.  HENT:  Head: Normocephalic.  Right Ear: External ear normal.  Left Ear: External ear normal.  Mouth/Throat: Mucous membranes are  normal. Edematous present. Posterior oropharyngeal erythema present. No oropharyngeal exudate, posterior oropharyngeal edema or tonsillar abscesses.  Neck: Normal range of motion. Neck supple.  Pulmonary/Chest: Breath sounds normal.  Lymphadenopathy:    She has no cervical adenopathy.  Neurological: She is alert and oriented to person, place, and time.  Skin: Skin is warm and dry.    ED Course   Procedures (including critical care time)  Labs Reviewed  POCT RAPID STREP A ([facility] CARE ONLY)   No results found. 1. URI (upper respiratory infection)     MDM  Strep neg.  Linna Hoff, MD 07/19/13 1105

## 2013-07-21 LAB — CULTURE, GROUP A STREP

## 2013-09-09 ENCOUNTER — Other Ambulatory Visit: Payer: Self-pay | Admitting: Neurosurgery

## 2013-09-09 DIAGNOSIS — M47812 Spondylosis without myelopathy or radiculopathy, cervical region: Secondary | ICD-10-CM

## 2013-09-11 ENCOUNTER — Ambulatory Visit
Admission: RE | Admit: 2013-09-11 | Discharge: 2013-09-11 | Disposition: A | Discharge status: Home / Self Care | Payer: BC Managed Care – PPO | Source: Ambulatory Visit | Attending: Neurosurgery | Admitting: Neurosurgery

## 2013-09-11 DIAGNOSIS — M47812 Spondylosis without myelopathy or radiculopathy, cervical region: Secondary | ICD-10-CM

## 2013-09-12 ENCOUNTER — Other Ambulatory Visit: Payer: Self-pay | Admitting: Neurosurgery

## 2013-10-09 ENCOUNTER — Encounter (HOSPITAL_COMMUNITY): Payer: Self-pay | Admitting: Pharmacy Technician

## 2013-10-10 ENCOUNTER — Encounter (HOSPITAL_COMMUNITY)
Admission: RE | Admit: 2013-10-10 | Discharge: 2013-10-10 | Disposition: A | Discharge status: Home / Self Care | Payer: BC Managed Care – PPO | Source: Ambulatory Visit | Attending: Anesthesiology | Admitting: Anesthesiology

## 2013-10-10 ENCOUNTER — Encounter (HOSPITAL_COMMUNITY): Payer: Self-pay

## 2013-10-10 ENCOUNTER — Encounter (HOSPITAL_COMMUNITY)
Admission: RE | Admit: 2013-10-10 | Discharge: 2013-10-10 | Disposition: A | Discharge status: Home / Self Care | Payer: BC Managed Care – PPO | Source: Ambulatory Visit | Attending: Neurosurgery | Admitting: Neurosurgery

## 2013-10-10 DIAGNOSIS — Z01810 Encounter for preprocedural cardiovascular examination: Secondary | ICD-10-CM | POA: Insufficient documentation

## 2013-10-10 DIAGNOSIS — Z01818 Encounter for other preprocedural examination: Secondary | ICD-10-CM | POA: Insufficient documentation

## 2013-10-10 DIAGNOSIS — Z01812 Encounter for preprocedural laboratory examination: Secondary | ICD-10-CM | POA: Insufficient documentation

## 2013-10-10 HISTORY — DX: Pneumonia, unspecified organism: J18.9

## 2013-10-10 HISTORY — DX: Acute laryngitis: J04.0

## 2013-10-10 HISTORY — DX: Personal history of other diseases of the nervous system and sense organs: Z86.69

## 2013-10-10 HISTORY — DX: Nausea with vomiting, unspecified: R11.2

## 2013-10-10 HISTORY — DX: Weakness: R53.1

## 2013-10-10 HISTORY — DX: Other muscle spasm: M62.838

## 2013-10-10 HISTORY — DX: Pleurisy: R09.1

## 2013-10-10 HISTORY — DX: Frequency of micturition: R35.0

## 2013-10-10 HISTORY — DX: Dizziness and giddiness: R42

## 2013-10-10 HISTORY — DX: Shortness of breath: R06.02

## 2013-10-10 HISTORY — DX: Insomnia, unspecified: G47.00

## 2013-10-10 HISTORY — DX: Viral infection, unspecified: B34.9

## 2013-10-10 HISTORY — DX: Other specified postprocedural states: Z98.890

## 2013-10-10 NOTE — Progress Notes (Signed)
Sleep study done 4yrs ago-neg for sleep apnea

## 2013-10-10 NOTE — Progress Notes (Signed)
Per Revonda Standard pt needs medical clearance

## 2013-10-10 NOTE — Progress Notes (Signed)
Spoke with Natalie Le that pt wishes to cancel her procedure.Will reschedule once she is well

## 2013-10-10 NOTE — Pre-Procedure Instructions (Signed)
Natalie Le  10/10/2013   Your procedure is scheduled on:  Mon, Nov  3 @ 1:00 PM  Report to [facility] Short Stay Entrance A at 10:00 AM.  Call this number if you have problems the morning of surgery: [phone]   Remember:   Do not eat food or drink liquids after midnight.   Take these medicines the morning of surgery with A SIP OF WATER: Alprazolam(Xanax),Adderall(Amphetamine-Dextroamphetamine),Allegra(if needed),Prozac(Fluoxetine),and Flonase(Fluticasone)               No Goody's,BC's,Aleve,Aspirin,Fish Oil,Ibuprofen,or any Herbal Medications   Do not wear jewelry, make-up or nail polish.  Do not wear lotions, powders, or perfumes. You may wear deodorant.  Do not shave 48 hours prior to surgery.   Do not bring valuables to the hospital.  [facility] is not responsible                  for any belongings or valuables.               Contacts, dentures or bridgework may not be worn into surgery.  Leave suitcase in the car. After surgery it may be brought to your room.  For patients admitted to the hospital, discharge time is determined by your                treatment team.               Patients discharged the day of surgery will not be allowed to drive  home.    Special Instructions: Shower using CHG 2 nights before surgery and the night before surgery.  If you shower the day of surgery use CHG.  Use special wash - you have one bottle of CHG for all showers.  You should use approximately 1/3 of the bottle for each shower.   Please read over the following fact sheets that you were given: Pain Booklet, Coughing and Deep Breathing, MRSA Information and Surgical Site Infection Prevention

## 2013-10-10 NOTE — Progress Notes (Addendum)
Pt went to the ED in 2012 d/t having sharp chest pain that shes had for years  Noticed chest pain couple weeks ago  Stress test done >12yrs  Denies ever having a heart cath  Medical Md is Dr.Daniel Alric Ran reports in epic from 2012 and 2013

## 2013-10-10 NOTE — Progress Notes (Signed)
Called Dr.Daniel Paterson's office-notified her about pt still being sick.Will see pt at 1200 today

## 2013-10-20 ENCOUNTER — Encounter (HOSPITAL_COMMUNITY): Admission: RE | Payer: Self-pay | Source: Ambulatory Visit

## 2013-10-20 ENCOUNTER — Ambulatory Visit (HOSPITAL_COMMUNITY): Admission: RE | Admit: 2013-10-20 | Payer: BC Managed Care – PPO | Source: Ambulatory Visit | Admitting: Neurosurgery

## 2013-10-20 SURGERY — POSTERIOR CERVICAL FUSION/FORAMINOTOMY LEVEL 1
Anesthesia: General | Laterality: Right

## 2014-01-12 ENCOUNTER — Other Ambulatory Visit: Payer: Self-pay | Admitting: Internal Medicine

## 2014-01-12 DIAGNOSIS — Z1231 Encounter for screening mammogram for malignant neoplasm of breast: Secondary | ICD-10-CM

## 2014-01-21 ENCOUNTER — Ambulatory Visit
Admission: RE | Admit: 2014-01-21 | Discharge: 2014-01-21 | Disposition: A | Discharge status: Home / Self Care | Payer: Self-pay | Source: Ambulatory Visit | Attending: Internal Medicine | Admitting: Internal Medicine

## 2014-01-21 DIAGNOSIS — Z1231 Encounter for screening mammogram for malignant neoplasm of breast: Secondary | ICD-10-CM

## 2014-01-28 ENCOUNTER — Ambulatory Visit: Payer: BC Managed Care – PPO

## 2014-03-17 ENCOUNTER — Encounter: Payer: Self-pay | Admitting: Neurology

## 2014-03-17 ENCOUNTER — Encounter (INDEPENDENT_AMBULATORY_CARE_PROVIDER_SITE_OTHER): Payer: Self-pay

## 2014-03-17 ENCOUNTER — Ambulatory Visit (INDEPENDENT_AMBULATORY_CARE_PROVIDER_SITE_OTHER): Payer: BC Managed Care – PPO | Admitting: Neurology

## 2014-03-17 VITALS — BP 128/83 | HR 76 | Ht 67.0 in | Wt 195.0 lb

## 2014-03-17 DIAGNOSIS — R400 Somnolence: Secondary | ICD-10-CM

## 2014-03-17 DIAGNOSIS — R519 Headache, unspecified: Secondary | ICD-10-CM

## 2014-03-17 DIAGNOSIS — R51 Headache: Secondary | ICD-10-CM

## 2014-03-17 DIAGNOSIS — R0989 Other specified symptoms and signs involving the circulatory and respiratory systems: Secondary | ICD-10-CM

## 2014-03-17 DIAGNOSIS — R930 Abnormal findings on diagnostic imaging of skull and head, not elsewhere classified: Secondary | ICD-10-CM

## 2014-03-17 DIAGNOSIS — R413 Other amnesia: Secondary | ICD-10-CM

## 2014-03-17 DIAGNOSIS — R0683 Snoring: Secondary | ICD-10-CM

## 2014-03-17 DIAGNOSIS — R351 Nocturia: Secondary | ICD-10-CM

## 2014-03-17 DIAGNOSIS — R9089 Other abnormal findings on diagnostic imaging of central nervous system: Secondary | ICD-10-CM

## 2014-03-17 DIAGNOSIS — R0609 Other forms of dyspnea: Secondary | ICD-10-CM

## 2014-03-17 DIAGNOSIS — F909 Attention-deficit hyperactivity disorder, unspecified type: Secondary | ICD-10-CM

## 2014-03-17 DIAGNOSIS — Z87820 Personal history of traumatic brain injury: Secondary | ICD-10-CM

## 2014-03-17 DIAGNOSIS — G478 Other sleep disorders: Secondary | ICD-10-CM

## 2014-03-17 DIAGNOSIS — G4710 Hypersomnia, unspecified: Secondary | ICD-10-CM

## 2014-03-17 NOTE — Patient Instructions (Signed)
We will do a sleep study, and a brain MRI. No new meds needed from my end of things.

## 2014-03-17 NOTE — Progress Notes (Signed)
Subjective:    [name] is a 53 y.o. female.  HPI    Star Age, MD, PhD [facility] [address], [address] [place],  [address]  Dear Dr. Philip Aspen,   I saw your patient, Natalie Le, upon your kind request in my neurologic clinic today for initial consultation of her memory loss in the context of a possible sleep disorder, in particular concern for obstructive sleep apnea. The patient is unaccompanied today. As you know, Natalie Le is a 53 year old right-handed woman with an underlying medical history of ADD, hiatal hernia, irritable bowel syndrome, migraine headaches, chronic low back pain, insomnia, deafness R ear, migraine headaches, and hearing loss, who reports memory problems particularly with short-term memory issues and lapses in memory in the context of poor quality sleep. She did have a sleep study in [place] 2009 and I reviewed the report: This was from 07/23/2008, sleep efficiency was 83.1% with a latency to sleep of 13.5 minutes. She had mildly reduced percentage of REM sleep at 17.6 with a mildly prolonged REM latency of 135 minutes. She had normal percentage of deep sleep at 17.1. She had an AHI of 0.3. She slept exclusively in the supine position. Oxyhemoglobin desaturation nadir was 95%. She had lab work on 01/02/2014 with your office which I reviewed: She had a normal CMP, normal CBC, cholesterol was elevated at 248, LDL was elevated at 163. TSH was 2.78 and B12 was 567. She reports a FHx of AD in her mother and mom's cousin, PD in an aunt. She reports problems with her short term memory, including forgetfulness, difficulty recognizing familiar faces and occasional confusion for her surroundings, off and on since her fall from a horseback some 15 years ago. She hit her head, but was wearing a helmet. She had a concussion with some retrograde amnesia.  She works in [employer] in [employer]. She endorses feeling hyper  and stressed.  She had a sleep study many years ago in [place] (some 11 or 12 years ago), which showed some sleep apnea, per patient. She weighed less at the time. She gained weight after her hysterectomy some 4 years ago. She does not wake up rested, has frequent morning headaches, which could be migraine or allergy related, she feels. She goes to bed at 10 PM, and takes occasional trazodone, some 2-3 times a week, but even the 50 mg makes her groggy during the day. She snores some per her BF, who reports rare choking sounds and the patient has woken up with a sense of gasping. Snoring can be loud. She has some EDS and her ESS is 12/24 today. Her brother has OSA, on CPAP. She has nocturia x 2-3 times per night.  She had a CTH at the time of her horseback riding accident.  She had brain MRI on 07/01/12: Right frontal white matter hyperintensity is unchanged. Mild periventricular white matter hyperintensity is unchanged. These lesions are most likely due to chronic microvascular ischemia. Demyelinating disease considered less likely. No acute abnormality. I personally reviewed the images through the PACS system.  She had a prior study on 06/24/11 and a CTH on 07/01/12.     Her Past Medical History Is Significant For: Past Medical History  Diagnosis Date  . Allergy     takes Allegra D daily prn allergies and Flonase prn allergies  . Hiatal hernia   . Hearing impaired     deaf in right ear  .  Anxiety     takes Xanax prn anxiety  . Insomnia     takes Trazodone nightly prn sleep  . Bipolar 2 disorder     takes Adderall daily  . Depression     takes Prozac daily  . Muscle spasms of neck     takes Flexeril daily prn muscle spasms  . PONV (postoperative nausea and vomiting)   . Viral infection     09/25/13-was given an inhaler d/t SOB;was on ZPak  . Shortness of breath     with exertion/sitting  . Pleurisy     when in college  . Pneumonia     walking pneumonia in college  . History of migraine      last one on 10/09/13  . Dizziness   . Weakness     both hands  . Urinary frequency   . Laryngitis     Her Past Surgical History Is Significant For: Past Surgical History  Procedure Laterality Date  . Colonoscopy    . Abdominal hysterectomy    . Bunionectomy    . Fx nose repair    . Elbow fracture surgery Right   . Abdominal hernia repair    . Bladder  stretch    . Upper gastrointestinal endoscopy    . Tumor removed from left breast  65yrs ago  . Cyst removed from left arm       as a child    Her Family History Is Significant For: Family History  Problem Relation Age of Onset  . Colon cancer Maternal Grandfather   . Alzheimer's disease Mother   . Heart Problems Father     Her Social History Is Significant For: History   Social History  . Marital Status: Single    Spouse Name: N/A    Number of Children: 0  . Years of Education: college   Occupational History  .      Sales   Social History Main Topics  . Smoking status: Never Smoker   . Smokeless tobacco: Never Used  . Alcohol Use: 0.6 oz/week    1 Glasses of wine per week     Comment: rarely  . Drug Use: No  . Sexual Activity: Yes    Birth Control/ Protection: Surgical   Other Topics Concern  . None   Social History Narrative   Patient lives at home with her boyfriend.   Patient works full time Press photographer.   Education college degree   Right handed   Caffeine one cup of coffee daily sometimes tea.    Her Allergies Are:  Allergies  Allergen Reactions  . Demerol Nausea And Vomiting  . Topamax [Topiramate] Other (See Comments)    Patient had a lot of reactions with increased dosage of this medication, including heart rate increase.  . Lamictal [Lamotrigine] Rash  :   Her Current Medications Are:  Outpatient Encounter Prescriptions as of 03/17/2014  Medication Sig  . ALPRAZolam (XANAX) 0.25 MG tablet Take 0.25 mg by mouth daily as needed for anxiety.  Marland Kitchen amphetamine-dextroamphetamine (ADDERALL) 20 MG  tablet Take 20 mg by mouth 3 (three) times daily.  . cyclobenzaprine (FLEXERIL) 10 MG tablet Take 10 mg by mouth every 8 (eight) hours as needed for muscle spasms.  Marland Kitchen Fexofenadine-Pseudoephedrine (ALLEGRA-D 24 HOUR PO) Take 1 tablet by mouth daily as needed (for allergies).  Marland Kitchen FLUoxetine (PROZAC) 20 MG tablet Take 20 mg by mouth every morning.  . fluticasone (FLONASE) 50 MCG/ACT nasal spray Place 2 sprays into  the nose daily as needed for allergies.   . nabumetone (RELAFEN) 500 MG tablet Take 500 mg by mouth 2 (two) times daily as needed for pain.  Marland Kitchen PRESCRIPTION MEDICATION Place 600 mg vaginally daily. Boric Acid suppositories compounded by [employer].  . traZODone (DESYREL) 100 MG tablet Take 50-100 mg by mouth at bedtime as needed for sleep.  :   Review of Systems:  Out of a complete 14 point review of systems, all are reviewed and negative with the exception of these symptoms as listed below:  Review of Systems  Constitutional: Positive for fatigue and unexpected weight change.  Cardiovascular: Positive for chest pain.  Endocrine: Positive for heat intolerance.    Objective:  Neurologic Exam  Physical Exam Physical Examination:   Filed Vitals:   03/17/14 0817  BP: 128/83  Pulse: 76    General Examination: The patient is a very pleasant 53 y.o. female in no acute distress. She appears well-developed and well-nourished and adequately groomed.   HEENT: Normocephalic, atraumatic, pupils are equal, round and reactive to light and accommodation. Funduscopic exam is normal with sharp disc margins noted. Extraocular tracking is good without limitation to gaze excursion or nystagmus noted. Normal smooth pursuit is noted. Hearing is quite impaired on both sides, R more than L. Tympanic membranes are clear bilaterally, but there is excess cerumen b/l. Face is symmetric with normal facial animation and normal facial sensation. Speech is clear with no dysarthria noted. There is no  hypophonia. There is no lip, neck/head, jaw or voice tremor. Neck is supple with full range of passive and active motion. There are no carotid bruits on auscultation. Oropharynx exam reveals: mild mouth dryness, adequate dental hygiene and moderate airway crowding, due to narrow airway and tonsils still in place. Mallampati is class II. Tongue protrudes centrally and palate elevates symmetrically. Tonsils are 1+ in size. Neck size is 15.5 inches.   Chest: Clear to auscultation without wheezing, rhonchi or crackles noted.  Heart: S1+S2+0, regular and normal without murmurs, rubs or gallops noted.   Abdomen: Soft, non-tender and non-distended with normal bowel sounds appreciated on auscultation.  Extremities: There is no pitting edema in the distal lower extremities bilaterally. Pedal pulses are intact.  Skin: Warm and dry without trophic changes noted. There are no varicose veins.  Musculoskeletal: exam reveals no obvious joint deformities, tenderness or joint swelling or erythema.   Neurologically:  Mental status: The patient is awake, alert and oriented in all 4 spheres. Her immediate and remote memory, attention, language skills and fund of knowledge are fairly appropriate, but she has a tendency to pressured speech and needs some re-direction. Her MMSE is 30/30, AFT is 24/minutes, CDT is 4/4.  There is no evidence of aphasia, agnosia, apraxia or anomia. Speech is clear with normal prosody and enunciation, except for loud speech. Thought process is linear. Mood is normal and affect is normal.  Cranial nerves II - XII are as described above under HEENT exam. In addition: shoulder shrug is normal with equal shoulder height noted. Motor exam: Normal bulk, strength and tone is noted. There is no drift, tremor or rebound. Romberg is negative. Reflexes are 2+ throughout. Babinski: Toes are flexor bilaterally. Fine motor skills and coordination: intact with normal finger taps, normal hand movements,  normal rapid alternating patting, normal foot taps and normal foot agility.  Cerebellar testing: No dysmetria or intention tremor on finger to nose testing. Heel to shin is unremarkable bilaterally. There is no truncal or gait  ataxia.  Sensory exam: intact to light touch, pinprick, vibration, temperature sense and proprioception in the upper and lower extremities.  Gait, station and balance: She stands easily. No veering to one side is noted. No leaning to one side is noted. Posture is age-appropriate and stance is narrow based. Gait shows normal stride length and normal pace. No problems turning are noted. She turns en bloc. Tandem walk is unremarkable. Intact toe and heel stance is noted.               Assessment and Plan:   In summary, Natalie Le is a very pleasant 53 y.o.-year old female with an underlying medical history of ADD, hiatal hernia, irritable bowel syndrome, migraine headaches, chronic low back pain, insomnia, deafness R ear, migraine headaches, and hearing loss, who reports memory problems particularly with short-term memory issues and lapses in memory in the context of poor quality sleep. Reassuringly, her memory scores and neurological exam are non-focal, but her history of snoring, non-restorative sleep, morning HAs and EDS is concerning for underlying OSA.  I had a long chat with the patient about my findings and the diagnosis of OSA, and memory loss, its prognosis and treatment options. We talked about medical treatments, surgical interventions and non-pharmacological approaches. I explained in particular the risks and ramifications of untreated moderate to severe OSA, especially with respect to developing cardiovascular disease down the Road, including congestive heart failure, difficult to treat hypertension, cardiac arrhythmias, or stroke. Even type 2 diabetes has, in part, been linked to untreated OSA. Symptoms of untreated OSA include daytime sleepiness, memory problems, mood  irritability and mood disorder such as depression and anxiety, lack of energy, as well as recurrent headaches, especially morning headaches. We talked about trying to maintain a healthy lifestyle in general, as well as the importance of weight control. I encouraged the patient to eat healthy, exercise daily and keep well hydrated, to keep a scheduled bedtime and wake time routine, to not skip any meals and eat healthy snacks in between meals. I advised the patient not to drive when feeling sleepy. Her memory loss may be a function of prior head injury, and ADHD, some mood issues (she states, she was labeled with Bipolar disease in the past). I recommended the following at this time: sleep study with potential positive airway pressure titration. I would like to proceed with a brain MRI w/wo contrast, due to prior head injury, amnesia, headaches and abnormalities seen, which have been stable before. She had recent blood work, which I reviewed.  I explained the sleep test procedure to the patient and also outlined possible surgical and non-surgical treatment options of OSA, including the use of a custom-made dental device (which would require a referral to a specialist dentist or oral surgeon), upper airway surgical options, such as pillar implants, radiofrequency surgery, tongue base surgery, and UPPP (which would involve a referral to an ENT surgeon). Rarely, jaw surgery such as mandibular advancement may be considered.  I also explained the CPAP treatment option to the patient, who indicated that she would be willing to try CPAP if the need arises. I explained the importance of being compliant with PAP treatment, not only for insurance purposes but primarily to improve Her symptoms, and for the patient's long term health benefit, including to reduce Her cardiovascular risks. I answered all her questions today and the patient was in agreement. I would like to see her back after the sleep study and MRI are  completed and encouraged  her to call with any interim questions, concerns, problems or updates.   Thank you very much for allowing me to participate in the care of this nice patient. If I can be of any further assistance to you please do not hesitate to call me at [phone].  Sincerely,   Star Age, MD, PhD

## 2014-03-17 NOTE — Progress Notes (Deleted)
Subjective:    [name] is a 53 y.o. female.  HPI {Common ambulatory SmartLinks:[id number]}  Review of Systems  Constitutional: Positive for fatigue.       Weight gain  Eyes:       Blurred vision  Respiratory: Positive for shortness of breath.   Cardiovascular: Positive for chest pain.  Endocrine: Positive for heat intolerance.       Feeling hot    Objective:  Neurologic Exam  Physical Exam  Assessment:   ***  Plan:   ***

## 2014-03-18 ENCOUNTER — Ambulatory Visit (INDEPENDENT_AMBULATORY_CARE_PROVIDER_SITE_OTHER): Payer: BC Managed Care – PPO | Admitting: Neurology

## 2014-03-18 DIAGNOSIS — G478 Other sleep disorders: Secondary | ICD-10-CM

## 2014-03-18 DIAGNOSIS — R400 Somnolence: Secondary | ICD-10-CM

## 2014-03-18 DIAGNOSIS — F909 Attention-deficit hyperactivity disorder, unspecified type: Secondary | ICD-10-CM

## 2014-03-18 DIAGNOSIS — R51 Headache: Secondary | ICD-10-CM

## 2014-03-18 DIAGNOSIS — G4733 Obstructive sleep apnea (adult) (pediatric): Secondary | ICD-10-CM

## 2014-03-18 DIAGNOSIS — R519 Headache, unspecified: Secondary | ICD-10-CM

## 2014-03-18 DIAGNOSIS — G479 Sleep disorder, unspecified: Secondary | ICD-10-CM

## 2014-03-18 DIAGNOSIS — R0683 Snoring: Secondary | ICD-10-CM

## 2014-03-20 ENCOUNTER — Telehealth: Payer: Self-pay | Admitting: Neurology

## 2014-03-20 DIAGNOSIS — G4733 Obstructive sleep apnea (adult) (pediatric): Secondary | ICD-10-CM

## 2014-03-20 NOTE — Telephone Encounter (Signed)
Left message for patient regarding sleep study results, asked patient to call me back to discuss results and have questions answered.  Explained that a copy of the sleep study was sent to referring physician and copy of study is coming to them in the mail.  Gave some explanation in message:  Stated patient had severe OSA which was well treated with CPAP therapy and Dr. Rexene Alberts wanted patient to start therapy at home.  Orders will be forwarded to [facility] and we will ask for rush processing as patient's insurance will term at the end of the month.  A copy of the sleep study interpretive report as well as a letter with info regarding contact info for the DME company, the importance of CPAP compliance, and the need to schedule a follow up appointment info will be mailed to the patient's home.  A copy of the report will also be forwarded to Dr. Philip Aspen by fax.

## 2014-03-20 NOTE — Telephone Encounter (Signed)
Please call and notify patient that the recent sleep study confirmed the diagnosis of OSA. She did very well with CPAP during the study with significant improvement of the respiratory events. Therefore, I would like start the patient on CPAP at home. I placed the order in the chart.   Arrange for CPAP set up at home through a DME company of patient's choice and fax/route report to PCP and referring MD (if other than PCP).   The patient will also need a follow up appointment with me in 6-8 weeks post set up that has to be scheduled; help the patient schedule this (in a follow-up slot).   Please re-enforce the importance of compliance with treatment and the need for us to monitor compliance data.   Once you have spoken to the patient and scheduled the return appointment, you may close this encounter, thanks,   Mariam Helbert, MD, PhD [facility]    

## 2014-03-24 ENCOUNTER — Telehealth: Payer: Self-pay | Admitting: Neurology

## 2014-03-24 ENCOUNTER — Encounter: Payer: Self-pay | Admitting: *Deleted

## 2014-03-24 NOTE — Telephone Encounter (Signed)
Patient called wanting to know if her CPAP has been sent to DME company. I informed the patient that the order was sent to [facility] and they'll call her once receiving benefits for the CPAP machine. I informed the patient if she hasn't heard anything by Wednesday ( 03-25-14) to callback and I'll check on her order.

## 2014-03-26 ENCOUNTER — Ambulatory Visit (INDEPENDENT_AMBULATORY_CARE_PROVIDER_SITE_OTHER): Payer: BC Managed Care – PPO

## 2014-03-26 DIAGNOSIS — R413 Other amnesia: Secondary | ICD-10-CM

## 2014-03-26 DIAGNOSIS — R9089 Other abnormal findings on diagnostic imaging of central nervous system: Secondary | ICD-10-CM

## 2014-03-26 DIAGNOSIS — R519 Headache, unspecified: Secondary | ICD-10-CM

## 2014-03-26 DIAGNOSIS — F909 Attention-deficit hyperactivity disorder, unspecified type: Secondary | ICD-10-CM

## 2014-03-26 DIAGNOSIS — R51 Headache: Secondary | ICD-10-CM

## 2014-03-26 DIAGNOSIS — G478 Other sleep disorders: Secondary | ICD-10-CM

## 2014-03-26 DIAGNOSIS — R0609 Other forms of dyspnea: Secondary | ICD-10-CM

## 2014-03-26 DIAGNOSIS — R0683 Snoring: Secondary | ICD-10-CM

## 2014-03-26 DIAGNOSIS — G4710 Hypersomnia, unspecified: Secondary | ICD-10-CM

## 2014-03-26 DIAGNOSIS — Z87820 Personal history of traumatic brain injury: Secondary | ICD-10-CM

## 2014-03-26 DIAGNOSIS — R0989 Other specified symptoms and signs involving the circulatory and respiratory systems: Secondary | ICD-10-CM

## 2014-03-26 DIAGNOSIS — R930 Abnormal findings on diagnostic imaging of skull and head, not elsewhere classified: Secondary | ICD-10-CM

## 2014-03-26 DIAGNOSIS — R400 Somnolence: Secondary | ICD-10-CM

## 2014-03-26 MED ORDER — GADOPENTETATE DIMEGLUMINE 469.01 MG/ML IV SOLN: 18.0000 mL | Freq: Once | INTRAVENOUS | Status: AC | PRN

## 2014-03-31 NOTE — Progress Notes (Signed)
Quick Note:  Please advise patient that her brain MRI showed no new findings and appears stable from July 2013. This is reassuring. No further action is required. Star Age, MD, PhD [facility]  ______

## 2014-05-01 ENCOUNTER — Encounter: Payer: Self-pay | Admitting: Neurology

## 2014-05-14 ENCOUNTER — Encounter: Payer: Self-pay | Admitting: Neurology

## 2014-05-14 ENCOUNTER — Ambulatory Visit (INDEPENDENT_AMBULATORY_CARE_PROVIDER_SITE_OTHER): Payer: BC Managed Care – PPO | Admitting: Neurology

## 2014-05-14 ENCOUNTER — Encounter (INDEPENDENT_AMBULATORY_CARE_PROVIDER_SITE_OTHER): Payer: Self-pay

## 2014-05-14 VITALS — BP 107/72 | HR 69 | Temp 96.8°F | Ht 67.0 in | Wt 193.0 lb

## 2014-05-14 DIAGNOSIS — Z9989 Dependence on other enabling machines and devices: Secondary | ICD-10-CM

## 2014-05-14 DIAGNOSIS — F909 Attention-deficit hyperactivity disorder, unspecified type: Secondary | ICD-10-CM

## 2014-05-14 DIAGNOSIS — G4700 Insomnia, unspecified: Secondary | ICD-10-CM

## 2014-05-14 DIAGNOSIS — G4733 Obstructive sleep apnea (adult) (pediatric): Secondary | ICD-10-CM

## 2014-05-14 DIAGNOSIS — F39 Unspecified mood [affective] disorder: Secondary | ICD-10-CM

## 2014-05-14 HISTORY — DX: Obstructive sleep apnea (adult) (pediatric): G47.33

## 2014-05-14 NOTE — Patient Instructions (Signed)
Please continue using your CPAP regularly. While your insurance requires that you use CPAP at least 4 hours each night on 70% of the nights, I recommend, that you not skip any nights and use it throughout the night if you can. Getting used to CPAP and staying with the treatment long term does take time and patience and discipline. Untreated obstructive sleep apnea when it is moderate to severe can have an adverse impact on cardiovascular health and raise her risk for heart disease, arrhythmias, hypertension, congestive heart failure, stroke and diabetes. Untreated obstructive sleep apnea causes sleep disruption, nonrestorative sleep, and sleep deprivation. This can have an impact on your day to day functioning and cause daytime sleepiness and impairment of cognitive function, memory loss, mood disturbance, and problems focussing. Using CPAP regularly can improve these symptoms.  You can try Melatonin at night for sleep: take 3 to 5 mg one to 2 hours before your bedtime. Try to keep a set bedtime in order to take your melatonin accordingly.   Please remember to try to maintain good sleep hygiene, which means: Keep a regular sleep and wake schedule, try not to exercise or have a meal within 2 hours of your bedtime, try to keep your bedroom conducive for sleep, that is, cool and dark, without light distractors such as an illuminated alarm clock, and refrain from watching TV right before sleep or in the middle of the night and do not keep the TV or radio on during the night. Also, try not to use or play on electronic devices at bedtime, such as your cell phone, tablet PC or laptop. If you like to read at bedtime on an electronic device, try to dim the background light as much as possible. Do not eat in the middle of the night.

## 2014-05-14 NOTE — Progress Notes (Signed)
Subjective:    [name] is a 53 y.o. female.  HPI    Interim history:   Natalie Le is a 53 year old right-handed woman with an underlying medical history of ADD, hiatal hernia, irritable bowel syndrome, migraine headaches, chronic low back pain, insomnia, deafness R ear, migraine headaches, and hearing loss, who presents for followup consultation of her obstructive sleep apnea after her recent split-night sleep study. She is unaccompanied today. I first met her on 03/17/2014 at the request of her primary care physician, at which time she reported memory problems particularly with short-term memory issues and lapses in memory in the context of poor quality sleep. We talked about her prior sleep study from 2009 reviewed the test results. She had at the time no significant obstructive sleep apnea. She also had a sleep study when she was in [place] some 11 or 12 years ago which per her report had shown some sleep apnea. She reported weight gain after her hysterectomy 4 years ago. She reported frequent morning headaches, nonrestorative sleep and difficulty with sleep maintenance as well as excessive daytime somnolence. I invited her back for sleep study. She had a split-night sleep study on 03/18/2014 and went over her test results with her in detail today. Baseline sleep efficiency was 86.2% with a latency to sleep of 6.5 minutes and wake after sleep onset of 14.5 minutes with moderate to severe sleep fragmentation noted. She had a elevated arousal index. She had increased percentages of stage I and stage II sleep, a normal percentage of slow-wave sleep, and absence of REM sleep. She had no significant EKG changes. She had no significant periodic leg movements of sleep. She had mild to moderate and rare loud snoring. She had a total AHI of 52 per hour. Baseline oxygen saturation was 95%, nadir was 89%. She was therefore titrated on CPAP from 5-9 cm. Sleep efficiency during the second portion  of the study was increased at 93.1%. Arousal index was normal. She had an increased percentage of stage I and stage II sleep, a very small percentage of slow-wave sleep and 17.5% of REM sleep. Average oxygen saturation was 96%, nadir was 93%. She felt that she had slept about the same as usual but did not wake up with a headache which was better. I reviewed her CPAP compliance data from 04/01/2014 through 04/30/2014 which is 30 days during which time she uses CPAP every night except for one night. Percent used days greater than 4 hours was 77%, indicating good compliance. Residual AHI was 2.9 per hour indicating inadequate pressure of 9 cm with EPR of 1. Average usage for all night sweats 5 hours and 21 minutes. Leak was low with the 95th percentile at 13.5 L per minute.  Today, I reviewed her compliance data from 03/31/2014 through 05/13/2014 which is a total of 44 days during which time she uses CPAP every night except for 2 nights. Percent used days greater than 4 hours was 70%, adequate. Average usage for all night sweats 5 hours and 3 minutes. Residual AHI acceptable at 2.6 per hour on a pressure of 9 cm with EPR of 1, leak was low with 13.4 L per minute at the 95th percentile.  Today, she reports that she is actually sleeping better, she also feels that her memory is actually better. She is still struggling with sleep maintenance and sleep onset issues. This has been an ongoing thing. Sometimes she takes Xanax and trazodone at night but does not like  to do both every night. She has recently had more stress at work and at the house and gets easily frustrated. Sometimes she has taken the mask off because it bothered her scalp and her face.  Her Past Medical History Is Significant For: Past Medical History  Diagnosis Date  . Allergy     takes Allegra D daily prn allergies and Flonase prn allergies  . Hiatal hernia   . Hearing impaired     deaf in right ear  . Anxiety     takes Xanax prn anxiety  .  Insomnia     takes Trazodone nightly prn sleep  . Bipolar 2 disorder     takes Adderall daily  . Depression     takes Prozac daily  . Muscle spasms of neck     takes Flexeril daily prn muscle spasms  . PONV (postoperative nausea and vomiting)   . Viral infection     09/25/13-was given an inhaler d/t SOB;was on ZPak  . Shortness of breath     with exertion/sitting  . Pleurisy     when in college  . Pneumonia     walking pneumonia in college  . History of migraine     last one on 10/09/13  . Dizziness   . Weakness     both hands  . Urinary frequency   . Laryngitis   . OSA on CPAP 05/14/2014    Her Past Surgical History Is Significant For: Past Surgical History  Procedure Laterality Date  . Colonoscopy    . Abdominal hysterectomy    . Bunionectomy    . Fx nose repair    . Elbow fracture surgery Right   . Abdominal hernia repair    . Bladder  stretch    . Upper gastrointestinal endoscopy    . Tumor removed from left breast  58yr ago  . Cyst removed from left arm       as a child    Her Family History Is Significant For: Family History  Problem Relation Age of Onset  . Colon cancer Maternal Grandfather   . Alzheimer's disease Mother   . Heart Problems Father     Her Social History Is Significant For: History   Social History  . Marital Status: Single    Spouse Name: N/A    Number of Children: 0  . Years of Education: college   Occupational History  .      Sales   Social History Main Topics  . Smoking status: Never Smoker   . Smokeless tobacco: Never Used  . Alcohol Use: 0.6 oz/week    1 Glasses of wine per week     Comment: rarely  . Drug Use: No  . Sexual Activity: Yes    Birth Control/ Protection: Surgical   Other Topics Concern  . None   Social History Narrative   Patient lives at home with her boyfriend.   Patient works full time sPress photographer   Education college degree   Right handed   Caffeine one cup of coffee daily sometimes tea.    Her  Allergies Are:  Allergies  Allergen Reactions  . Demerol Nausea And Vomiting  . Topamax [Topiramate] Other (See Comments)    Patient had a lot of reactions with increased dosage of this medication, including heart rate increase.  . Lamictal [Lamotrigine] Rash  :   Her Current Medications Are:  Outpatient Encounter Prescriptions as of 05/14/2014  Medication Sig  . ALPRAZolam (XANAX) 0.25  MG tablet Take 0.25 mg by mouth daily as needed for anxiety.  Marland Kitchen amphetamine-dextroamphetamine (ADDERALL) 20 MG tablet Take 20 mg by mouth 3 (three) times daily.  . cyclobenzaprine (FLEXERIL) 10 MG tablet Take 10 mg by mouth every 8 (eight) hours as needed for muscle spasms.  Marland Kitchen Fexofenadine-Pseudoephedrine (ALLEGRA-D 24 HOUR PO) Take 1 tablet by mouth daily as needed (for allergies).  Marland Kitchen FLUoxetine (PROZAC) 20 MG tablet Take 20 mg by mouth every morning.  . fluticasone (FLONASE) 50 MCG/ACT nasal spray Place 2 sprays into the nose daily as needed for allergies.   . nabumetone (RELAFEN) 500 MG tablet Take 500 mg by mouth 2 (two) times daily as needed for pain.  Marland Kitchen PRESCRIPTION MEDICATION Place 600 mg vaginally daily. Boric Acid suppositories compounded by [employer].  . traZODone (DESYREL) 100 MG tablet Take 50-100 mg by mouth at bedtime as needed for sleep.  :  Review of Systems:  Out of a complete 14 point review of systems, all are reviewed and negative with the exception of these symptoms as listed below:  Review of Systems  Constitutional: Positive for fatigue.  HENT: Positive for hearing loss, tinnitus and trouble swallowing.   Eyes: Negative.   Respiratory: Positive for shortness of breath.   Cardiovascular: Negative.   Gastrointestinal: Positive for abdominal pain.  Endocrine: Positive for heat intolerance.  Genitourinary: Negative.   Musculoskeletal: Positive for arthralgias, back pain and myalgias.  Skin: Negative.   Allergic/Immunologic: Positive for environmental allergies.   Neurological: Positive for headaches.       Memory loss  Hematological: Negative.   Psychiatric/Behavioral: Positive for sleep disturbance (apnea, insomnia, frequent waking) and dysphoric mood. The patient is nervous/anxious.     Objective:  Neurologic Exam  Physical Exam Physical Examination:   Filed Vitals:   05/14/14 0859  BP: 107/72  Pulse: 69  Temp: 96.8 F (36 C)    General Examination: The patient is a very pleasant 53 y.o. female in no acute distress. She appears well-developed and well-nourished and well groomed.   HEENT: Normocephalic, atraumatic, pupils are equal, round and reactive to light and accommodation. Funduscopic exam is normal with sharp disc margins noted. Extraocular tracking is good without limitation to gaze excursion or nystagmus noted. Normal smooth pursuit is noted. Hearing is quite impaired on both sides, R more than L. Tympanic membranes are obscured bilaterally with cerumen b/l. Face is symmetric with normal facial animation and normal facial sensation. Speech is clear with no dysarthria noted. There is no hypophonia. There is no lip, neck/head, jaw or voice tremor. Neck is supple with full range of passive and active motion. There are no carotid bruits on auscultation. Oropharynx exam reveals: mild mouth dryness, adequate dental hygiene and moderate airway crowding, due to narrow airway and tonsils still in place. Mallampati is class II. Tongue protrudes centrally and palate elevates symmetrically. Tonsils are 1+ in size. Neck size is 15.5 inches.   Chest: Clear to auscultation without wheezing, rhonchi or crackles noted.  Heart: S1+S2+0, regular and normal without murmurs, rubs or gallops noted.   Abdomen: Soft, non-tender and non-distended with normal bowel sounds appreciated on auscultation.  Extremities: There is no pitting edema in the distal lower extremities bilaterally. Pedal pulses are intact.  Skin: Warm and dry without trophic changes  noted. There are no varicose veins.  Musculoskeletal: exam reveals no obvious joint deformities, tenderness or joint swelling or erythema.   Neurologically:  Mental status: The patient is awake, alert and oriented in  all 4 spheres. Her immediate and remote memory, attention, language skills and fund of knowledge are fairly appropriate, but she has a tendency to pressured speech and needs some re-direction. Her MMSE on 03/17/14: 30/30, AFT 24/minutes, CDT 4/4.  There is no evidence of aphasia, agnosia, apraxia or anomia. Speech is clear with normal prosody and enunciation, except for loud speech. Thought process is linear. Mood is normal and affect is normal.  Cranial nerves II - XII are as described above under HEENT exam. In addition: shoulder shrug is normal with equal shoulder height noted. Motor exam: Normal bulk, strength and tone is noted. There is no drift, tremor or rebound. Romberg is negative. Reflexes are 2+ throughout. Babinski: Toes are flexor bilaterally. Fine motor skills and coordination: intact with normal finger taps, normal hand movements, normal rapid alternating patting, normal foot taps and normal foot agility.  Cerebellar testing: No dysmetria or intention tremor on finger to nose testing. There is no truncal or gait ataxia.  Sensory exam: intact to light touch, pinprick, vibration, temperature sense in the upper and lower extremities.  Gait, station and balance: She stands easily. No veering to one side is noted. No leaning to one side is noted. Posture is age-appropriate and stance is narrow based. Gait shows normal stride length and normal pace. No problems turning are noted. She turns en bloc. Tandem walk is unremarkable. Intact toe and heel stance is noted.               Assessment and Plan:   In summary, Natalie Le is a very pleasant 53 year old female with an underlying medical history of ADD, hiatal hernia, irritable bowel syndrome, migraine headaches, chronic low  back pain, insomnia, deafness R ear, migraine headaches, memory loss and hearing loss, who has recently had a split-night sleep study which confirmed severe sleep apnea by number of events. Thankfully by oxygen criteria she was not severe. She has established CPAP treatment and has been on it about 6 weeks now. She has adjusted well to it. We went over her test results in detail and her compliance data as well. She is congratulated on memory  sticking with treatments and being compliant. She needs compliance criteria. She is encouraged to not skip any days. Sometimes she gets frustrated and takes the mask off. She does endorse improvement of her sleep quality and her memory in fact seems better as well. She does not always keep a scheduled that time. She usually wakes up around 5:30 or 6 AM. Sometimes she lays down after that. She is encouraged to keep a better schedule and allow herself at least 7 hours of sleep. She had tried melatonin in the past and is encouraged to try it again, 3-5 mg strength about one to 2 hours before her set bedtime. She is encouraged to continue with CPAP treatment as it has helped. She is advised to try nasal mask next time she is up for a new mask. She may do better with a nasal mask as opposed to a nasal pillows mask. She was in agreement. Since from my end of things she is doing quite well I asked her to come back in about 6 months, sooner if the need arises. In the interim, she has any questions or concerns she is encouraged to call or e-mail. She was in agreement.

## 2014-11-16 ENCOUNTER — Encounter: Payer: Self-pay | Admitting: Neurology

## 2014-11-16 ENCOUNTER — Ambulatory Visit (INDEPENDENT_AMBULATORY_CARE_PROVIDER_SITE_OTHER): Payer: BC Managed Care – PPO | Admitting: Neurology

## 2014-11-16 VITALS — BP 120/78 | HR 67 | Ht 67.0 in | Wt 193.0 lb

## 2014-11-16 DIAGNOSIS — G4733 Obstructive sleep apnea (adult) (pediatric): Secondary | ICD-10-CM

## 2014-11-16 DIAGNOSIS — Z9989 Dependence on other enabling machines and devices: Principal | ICD-10-CM

## 2014-11-16 DIAGNOSIS — F39 Unspecified mood [affective] disorder: Secondary | ICD-10-CM

## 2014-11-16 NOTE — Progress Notes (Signed)
Subjective:    [name] is a 53 y.o. female.  HPI     Interim history:   Natalie Le is a 53 year old right-handed woman with an underlying medical history of ADD, hiatal hernia, irritable bowel syndrome, migraine headaches, chronic low back pain, insomnia, deafness R ear, migraine headaches, and hearing loss, who presents for followup consultation of her obstructive sleep apnea. She is unaccompanied today. I last saw her on 05/14/2014, at which time she reported sleeping better, and she felt her memory was better. She was still struggling with sleep maintenance and sleep onset issues. I suggested she try melatonin, 3-5 mg.   Today I reviewed her compliance data from 10/11/2014 through 11/09/2014 which is a total of 30 days during which time she used her machine 26 days. Percent used days greater than 4 hours was 73%, indicating adequate compliance, average usage of 5 hours and 35 minutes. Residual AHI at 2.8 per hour and leak was acceptable. Pressure at 9 cm with EPR of 1.  Today, she reports that she recently had congestion and still has a cough. She goes to [facility]. She feels the trazodone is not working very well. She has an appointment tomorrow with her psychiatrist. Because of the congestion she has not been able to use CPAP very regularly. She has been using over-the-counter decongestants and cold medicines. Overall, she feels that her sleep with CPAP therapy has improved. Her memory is stable. She has a trip to [place] in January and is planning to take her CPAP machine. She reports mouth breathing and wakes up often with a dry mouth. She would like to try a full facemask if possible.  I first met her on 03/17/2014 at the request of her primary care physician, at which time she reported memory problems particularly with short-term memory issues and lapses in memory in the context of poor quality sleep. We talked about her prior sleep study from 2009 reviewed the  test results. She had at the time no significant obstructive sleep apnea. She also had a sleep study when she was in [place] some 11 or 12 years ago which per her report had shown some sleep apnea. She reported weight gain after her hysterectomy 4 years ago. She reported frequent morning headaches, nonrestorative sleep and difficulty with sleep maintenance as well as excessive daytime somnolence. I invited her back for sleep study.   She had a split-night sleep study on 03/18/2014. Baseline sleep efficiency was 86.2% with a latency to sleep of 6.5 minutes and wake after sleep onset of 14.5 minutes with moderate to severe sleep fragmentation noted. She had a elevated arousal index. She had increased percentages of stage I and stage II sleep, a normal percentage of slow-wave sleep, and absence of REM sleep. She had no significant EKG changes. She had no significant periodic leg movements of sleep. She had mild to moderate and rare loud snoring. She had a total AHI of 52 per hour. Baseline oxygen saturation was 95%, nadir was 89%. She was therefore titrated on CPAP from 5-9 cm. Sleep efficiency during the second portion of the study was increased at 93.1%. Arousal index was normal. She had an increased percentage of stage I and stage II sleep, a very small percentage of slow-wave sleep and 17.5% of REM sleep. Average oxygen saturation was 96%, nadir was 93%. She felt that she had slept about the same as usual but did not wake up with a headache which was better. I reviewed  her CPAP compliance data from 04/01/2014 through 04/30/2014 which is 30 days during which time she uses CPAP every night except for one night. Percent used days greater than 4 hours was 77%, indicating good compliance. Residual AHI was 2.9 per hour indicating inadequate pressure of 9 cm with EPR of 1. Average usage for all night sweats 5 hours and 21 minutes. Leak was low with the 95th percentile at 13.5 L per minute.  I reviewed her compliance data  from 03/31/2014 through 05/13/2014 which is a total of 44 days during which time she used CPAP every night except for 2 nights. Percent used days greater than 4 hours was 70%, indicating adequate compliance. Average usage for all nights was 5 hours and 3 minutes. Residual AHI acceptable at 2.6 per hour on a pressure of 9 cm with EPR of 1, leak was low with 13.4 L per minute at the 95th percentile.  Her Past Medical History Is Significant For: Past Medical History  Diagnosis Date  . Allergy     takes Allegra D daily prn allergies and Flonase prn allergies  . Hiatal hernia   . Hearing impaired     deaf in right ear  . Anxiety     takes Xanax prn anxiety  . Insomnia     takes Trazodone nightly prn sleep  . Bipolar 2 disorder     takes Adderall daily  . Depression     takes Prozac daily  . Muscle spasms of neck     takes Flexeril daily prn muscle spasms  . PONV (postoperative nausea and vomiting)   . Viral infection     09/25/13-was given an inhaler d/t SOB;was on ZPak  . Shortness of breath     with exertion/sitting  . Pleurisy     when in college  . Pneumonia     walking pneumonia in college  . History of migraine     last one on 10/09/13  . Dizziness   . Weakness     both hands  . Urinary frequency   . Laryngitis   . OSA on CPAP 05/14/2014    Her Past Surgical History Is Significant For: Past Surgical History  Procedure Laterality Date  . Colonoscopy    . Abdominal hysterectomy    . Bunionectomy    . Fx nose repair    . Elbow fracture surgery Right   . Abdominal hernia repair    . Bladder  stretch    . Upper gastrointestinal endoscopy    . Tumor removed from left breast  85yr ago  . Cyst removed from left arm       as a child    Her Family History Is Significant For: Family History  Problem Relation Age of Onset  . Colon cancer Maternal Grandfather   . Alzheimer's disease Mother   . Heart Problems Father     Her Social History Is Significant For: History    Social History  . Marital Status: Single    Spouse Name: N/A    Number of Children: 0  . Years of Education: college   Occupational History  .      Sales   Social History Main Topics  . Smoking status: Never Smoker   . Smokeless tobacco: Never Used  . Alcohol Use: 0.6 oz/week    1 Glasses of wine per week     Comment: rarely  . Drug Use: No  . Sexual Activity: Yes    Birth Control/ Protection:  Surgical   Other Topics Concern  . None   Social History Narrative   Patient lives at home with her boyfriend.   Patient works full time Press photographer.   Education college degree   Right handed   Caffeine one cup of coffee daily sometimes tea.    Her Allergies Are:  Allergies  Allergen Reactions  . Demerol Nausea And Vomiting  . Topamax [Topiramate] Other (See Comments)    Patient had a lot of reactions with increased dosage of this medication, including heart rate increase.  . Lamictal [Lamotrigine] Rash  :   Her Current Medications Are:  Outpatient Encounter Prescriptions as of 11/16/2014  Medication Sig  . ALPRAZolam (XANAX) 0.5 MG tablet Take 1 tablet by mouth as needed.  Marland Kitchen amphetamine-dextroamphetamine (ADDERALL) 20 MG tablet Take 20 mg by mouth 3 (three) times daily.  . cyclobenzaprine (FLEXERIL) 10 MG tablet Take 10 mg by mouth every 8 (eight) hours as needed for muscle spasms.  Marland Kitchen Fexofenadine-Pseudoephedrine (ALLEGRA-D 24 HOUR PO) Take 1 tablet by mouth daily as needed (for allergies).  Marland Kitchen FLUoxetine (PROZAC) 20 MG tablet Take 20 mg by mouth every morning.  . fluticasone (FLONASE) 50 MCG/ACT nasal spray Place 2 sprays into the nose daily as needed for allergies.   . metroNIDAZOLE (FLAGYL) 500 MG tablet Take 500 mg by mouth 2 (two) times daily.  . nabumetone (RELAFEN) 500 MG tablet Take 500 mg by mouth 2 (two) times daily as needed for pain.  . traZODone (DESYREL) 150 MG tablet Take 1 tablet by mouth at bedtime.  . [DISCONTINUED] traZODone (DESYREL) 100 MG tablet Take  50-100 mg by mouth at bedtime as needed for sleep.  . [DISCONTINUED] ALPRAZolam (XANAX) 0.25 MG tablet Take 0.25 mg by mouth daily as needed for anxiety.  . [DISCONTINUED] PRESCRIPTION MEDICATION Place 600 mg vaginally daily. Boric Acid suppositories compounded by [employer].  :  Review of Systems:  Out of a complete 14 point review of systems, all are reviewed and negative with the exception of these symptoms as listed below:   Review of Systems  Constitutional: Positive for fatigue.  HENT: Positive for congestion and sinus pressure.   Respiratory: Positive for chest tightness, shortness of breath and wheezing.   Genitourinary: Positive for frequency.  Neurological: Positive for dizziness.  Psychiatric/Behavioral: Positive for sleep disturbance and agitation. The patient is nervous/anxious.     Objective:  Neurologic Exam  Physical Exam Physical Examination:   Filed Vitals:   11/16/14 1330  BP: 120/78  Pulse: 67    General Examination: The patient is a very pleasant 53 y.o. female in no acute distress. She appears well-developed and well-nourished and well groomed. She is in good spirits today.   HEENT: Normocephalic, atraumatic, pupils are equal, round and reactive to light and accommodation. Funduscopic exam is normal with sharp disc margins noted. Extraocular tracking is good without limitation to gaze excursion or nystagmus noted. Normal smooth pursuit is noted. Hearing is quite impaired on both sides, R more than L. Face is symmetric with normal facial animation and normal facial sensation. Speech is clear with no dysarthria noted. There is no hypophonia. There is no lip, neck/head, jaw or voice tremor. Neck is supple with full range of passive and active motion. There are no carotid bruits on auscultation. Oropharynx exam reveals: mild mouth dryness, adequate dental hygiene and moderate airway crowding, due to narrow airway and tonsils still in place. Mallampati is class  II. Tongue protrudes centrally and palate elevates symmetrically.  Tonsils are 1+ in size.   Chest: Clear to auscultation without wheezing, rhonchi or crackles noted.  Heart: S1+S2+0, regular and normal without murmurs, rubs or gallops noted.   Abdomen: Soft, non-tender and non-distended with normal bowel sounds appreciated on auscultation.  Extremities: There is no pitting edema in the distal lower extremities bilaterally. Pedal pulses are intact.  Skin: Warm and dry without trophic changes noted. There are no varicose veins.  Musculoskeletal: exam reveals no obvious joint deformities, tenderness or joint swelling or erythema.   Neurologically:  Mental status: The patient is awake, alert and oriented in all 4 spheres. Her immediate and remote memory, attention, language skills and fund of knowledge are fairly appropriate, but she has a tendency to pressured speech and needs some re-direction.   Her MMSE on 03/17/14: 30/30, AFT 24/minutes, CDT 4/4.   There is no evidence of aphasia, agnosia, apraxia or anomia. Speech is clear with normal prosody and enunciation, except for loud speech. Thought process is linear. Mood is normal and affect is normal.  Cranial nerves II - XII are as described above under HEENT exam. In addition: shoulder shrug is normal with equal shoulder height noted. Motor exam: Normal bulk, strength and tone is noted. There is no drift, tremor or rebound. Romberg is negative. Reflexes are 2+ throughout. Fine motor skills and coordination: intact with normal finger taps, normal hand movements, normal rapid alternating patting, normal foot taps and normal foot agility.  Cerebellar testing: No dysmetria or intention tremor on finger to nose testing. There is no truncal or gait ataxia.  Sensory exam: intact to light touch, pinprick, vibration, temperature sense in the upper and lower extremities.  Gait, station and balance: She stands easily. No veering to one side is noted. No  leaning to one side is noted. Posture is age-appropriate and stance is narrow based. Gait shows normal stride length and normal pace. No problems turning are noted. She turns en bloc. Tandem walk is unremarkable.   Assessment and Plan:   In summary, CORA BRIERLEY is a very pleasant 53 year old female with an underlying medical history of ADD, hiatal hernia, irritable bowel syndrome, migraine headaches, chronic low back pain, insomnia, deafness R ear, migraine headaches, memory loss and overweight state, who presents for follow-up consultation of her obstructive sleep apnea, on treatment with CPAP at a pressure of 9 cm. She has adjusted well to it. We went over her test results and her compliance data as well. She is congratulated on  trying to stay compliant with treatment. Her physical exam is stable. She reports mouth opening and dry mouth. I will prescribe a full facemask. She still has issues with sleep maintenance. She is encouraged to continue with CPAP treatment as it has helped. I asked her to come back in about 6 months, sooner if the need arises. In the interim, she has any questions or concerns she is encouraged to call or e-mail. She was in agreement.

## 2014-11-16 NOTE — Patient Instructions (Addendum)
Please continue using your CPAP regularly. While your insurance requires that you use CPAP at least 4 hours each night on 70% of the nights, I recommend, that you not skip any nights and use it throughout the night if you can. Getting used to CPAP and staying with the treatment long term does take time and patience and discipline. Untreated obstructive sleep apnea when it is moderate to severe can have an adverse impact on cardiovascular health and raise her risk for heart disease, arrhythmias, hypertension, congestive heart failure, stroke and diabetes. Untreated obstructive sleep apnea causes sleep disruption, nonrestorative sleep, and sleep deprivation. This can have an impact on your day to day functioning and cause daytime sleepiness and impairment of cognitive function, memory loss, mood disturbance, and problems focussing. Using CPAP regularly can improve these symptoms.  I will see you back in 6 months.

## 2015-02-15 ENCOUNTER — Other Ambulatory Visit: Payer: Self-pay | Admitting: Internal Medicine

## 2015-02-15 DIAGNOSIS — N63 Unspecified lump in unspecified breast: Secondary | ICD-10-CM

## 2015-03-19 DIAGNOSIS — G459 Transient cerebral ischemic attack, unspecified: Secondary | ICD-10-CM

## 2015-03-19 HISTORY — DX: Transient cerebral ischemic attack, unspecified: G45.9

## 2015-04-04 ENCOUNTER — Emergency Department (HOSPITAL_COMMUNITY): Payer: BLUE CROSS/BLUE SHIELD

## 2015-04-04 ENCOUNTER — Observation Stay (HOSPITAL_COMMUNITY)
Admission: EM | Admit: 2015-04-04 | Discharge: 2015-04-05 | Disposition: A | Discharge status: Home / Self Care | Payer: BLUE CROSS/BLUE SHIELD | Attending: Internal Medicine | Admitting: Internal Medicine

## 2015-04-04 ENCOUNTER — Encounter (HOSPITAL_COMMUNITY): Payer: Self-pay | Admitting: *Deleted

## 2015-04-04 DIAGNOSIS — R200 Anesthesia of skin: Secondary | ICD-10-CM | POA: Insufficient documentation

## 2015-04-04 DIAGNOSIS — F3181 Bipolar II disorder: Secondary | ICD-10-CM | POA: Insufficient documentation

## 2015-04-04 DIAGNOSIS — F319 Bipolar disorder, unspecified: Secondary | ICD-10-CM | POA: Diagnosis present

## 2015-04-04 DIAGNOSIS — Z90710 Acquired absence of both cervix and uterus: Secondary | ICD-10-CM | POA: Insufficient documentation

## 2015-04-04 DIAGNOSIS — G459 Transient cerebral ischemic attack, unspecified: Secondary | ICD-10-CM | POA: Diagnosis present

## 2015-04-04 DIAGNOSIS — G4700 Insomnia, unspecified: Secondary | ICD-10-CM | POA: Diagnosis not present

## 2015-04-04 DIAGNOSIS — Z8701 Personal history of pneumonia (recurrent): Secondary | ICD-10-CM | POA: Diagnosis not present

## 2015-04-04 DIAGNOSIS — R4701 Aphasia: Secondary | ICD-10-CM | POA: Diagnosis not present

## 2015-04-04 DIAGNOSIS — G4733 Obstructive sleep apnea (adult) (pediatric): Secondary | ICD-10-CM | POA: Diagnosis not present

## 2015-04-04 DIAGNOSIS — F419 Anxiety disorder, unspecified: Secondary | ICD-10-CM | POA: Insufficient documentation

## 2015-04-04 DIAGNOSIS — R4781 Slurred speech: Secondary | ICD-10-CM | POA: Diagnosis present

## 2015-04-04 DIAGNOSIS — Z888 Allergy status to other drugs, medicaments and biological substances status: Secondary | ICD-10-CM | POA: Diagnosis not present

## 2015-04-04 DIAGNOSIS — Z7982 Long term (current) use of aspirin: Secondary | ICD-10-CM | POA: Insufficient documentation

## 2015-04-04 DIAGNOSIS — F1099 Alcohol use, unspecified with unspecified alcohol-induced disorder: Secondary | ICD-10-CM | POA: Insufficient documentation

## 2015-04-04 DIAGNOSIS — Z9989 Dependence on other enabling machines and devices: Secondary | ICD-10-CM

## 2015-04-04 LAB — DIFFERENTIAL
Basophils Absolute: 0 10*3/uL (ref 0.0–0.1)
Basophils Relative: 1 % (ref 0–1)
EOS ABS: 0.1 10*3/uL (ref 0.0–0.7)
Eosinophils Relative: 1 % (ref 0–5)
LYMPHS ABS: 2 10*3/uL (ref 0.7–4.0)
Lymphocytes Relative: 31 % (ref 12–46)
Monocytes Absolute: 0.6 10*3/uL (ref 0.1–1.0)
Monocytes Relative: 9 % (ref 3–12)
NEUTROS PCT: 58 % (ref 43–77)
Neutro Abs: 3.6 10*3/uL (ref 1.7–7.7)

## 2015-04-04 LAB — CBC
HCT: 37.1 % (ref 36.0–46.0)
Hemoglobin: 12.5 g/dL (ref 12.0–15.0)
MCH: 29.7 pg (ref 26.0–34.0)
MCHC: 33.7 g/dL (ref 30.0–36.0)
MCV: 88.1 fL (ref 78.0–100.0)
Platelets: 246 10*3/uL (ref 150–400)
RBC: 4.21 MIL/uL (ref 3.87–5.11)
RDW: 12.9 % (ref 11.5–15.5)
WBC: 6.3 10*3/uL (ref 4.0–10.5)

## 2015-04-04 LAB — COMPREHENSIVE METABOLIC PANEL
ALT: 14 U/L (ref 0–35)
AST: 19 U/L (ref 0–37)
Albumin: 3.9 g/dL (ref 3.5–5.2)
Alkaline Phosphatase: 73 U/L (ref 39–117)
Anion gap: 12 (ref 5–15)
BILIRUBIN TOTAL: 1.6 mg/dL — AB (ref 0.3–1.2)
BUN: 17 mg/dL (ref 6–23)
CALCIUM: 9.3 mg/dL (ref 8.4–10.5)
CHLORIDE: 104 mmol/L (ref 96–112)
CO2: 20 mmol/L (ref 19–32)
Creatinine, Ser: 0.99 mg/dL (ref 0.50–1.10)
GFR calc non Af Amer: 63 mL/min — ABNORMAL LOW (ref >90)
GFR, EST AFRICAN AMERICAN: 73 mL/min — AB (ref >90)
GLUCOSE: 102 mg/dL — AB (ref 70–99)
Potassium: 3.7 mmol/L (ref 3.5–5.1)
Sodium: 136 mmol/L (ref 135–145)
Total Protein: 6.8 g/dL (ref 6.0–8.3)

## 2015-04-04 LAB — CBG MONITORING, ED: Glucose-Capillary: 96 mg/dL (ref 70–99)

## 2015-04-04 LAB — I-STAT TROPONIN, ED: Troponin i, poc: 0 ng/mL (ref 0.00–0.08)

## 2015-04-04 LAB — PROTIME-INR
INR: 1.1 (ref 0.00–1.49)
Prothrombin Time: 14.4 seconds (ref 11.6–15.2)

## 2015-04-04 LAB — APTT: aPTT: 33 seconds (ref 24–37)

## 2015-04-04 MED ORDER — ASPIRIN 81 MG PO CHEW
324.0000 mg | CHEWABLE_TABLET | Freq: Once | ORAL | Status: AC
  Administered 2015-04-04: 324 mg via ORAL
  Filled 2015-04-04: qty 4

## 2015-04-04 NOTE — Code Documentation (Signed)
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  At 1830 hrs while eating dinner pt began have trouble with her speech, difficulty finding words and using inappropriate words.  She was coughing and had some chest tightness so she took allegra thinking it was allergies.  At 2030 she took trazadone 100 mg and Xanax .75 mg to "ward off a panic attack".  When she developed numbness in her left foot  and continued slurred speech she had her boyfriend bring her to [facility] arriving at 2123.She was seen by Sarajane Jews and a code stroke was called at 2142.  She was cleared for CT at 2142 arriving at CT at 2144.  She was returned to [place]  Where her NIHSS was scored 2 with points given for left sided Sensory loss , and mild dysarthria.  Her slurred speech is resolving and the LLE numbness has resolved.  CT head showed no acute intracranial abnormality.  PT will have MRI and be admitted for TIA alert

## 2015-04-04 NOTE — ED Notes (Signed)
Natalie Le, [phone]

## 2015-04-04 NOTE — Consult Note (Signed)
Admission H&P    Chief Complaint: Transient speech output difficulty, slurred speech and numbness involving distal left lower extremity.  HPI: Natalie Le is an 54 y.o. female with a history of bipolar disorder, anxiety, hearing impairment and obstructive sleep apnea, presenting with slurred speech, transient word finding difficulty and numbness involving distal left lower extremity. Patient attributes of slurred speech to trazodone and Flexeril which she takes nightly. Speech output difficulty occurred prior to taking these medications and has since resolved. Lower extremity numbness is also resolved. She is also complaining of shortness of breath as well as a feeling of tightness in her chest. CT scan of her head showed no acute intracranial abnormality. She has not been on antiplatelet therapy. NIH stroke score was 2 at the time of this evaluation.  LSN: 6:30 PM on 04/04/2015 tPA Given: No: Resolving deficits mRankin:  Past Medical History  Diagnosis Date  . Allergy     takes Allegra D daily prn allergies and Flonase prn allergies  . Hiatal hernia   . Hearing impaired     deaf in right ear  . Anxiety     takes Xanax prn anxiety  . Insomnia     takes Trazodone nightly prn sleep  . Bipolar 2 disorder     takes Adderall daily  . Depression     takes Prozac daily  . Muscle spasms of neck     takes Flexeril daily prn muscle spasms  . PONV (postoperative nausea and vomiting)   . Viral infection     09/25/13-was given an inhaler d/t SOB;was on ZPak  . Shortness of breath     with exertion/sitting  . Pleurisy     when in college  . Pneumonia     walking pneumonia in college  . History of migraine     last one on 10/09/13  . Dizziness   . Weakness     both hands  . Urinary frequency   . Laryngitis   . OSA on CPAP 05/14/2014    Past Surgical History  Procedure Laterality Date  . Colonoscopy    . Abdominal hysterectomy    . Bunionectomy    . Fx nose repair    . Elbow  fracture surgery Right   . Abdominal hernia repair    . Bladder  stretch    . Upper gastrointestinal endoscopy    . Tumor removed from left breast  102yrs ago  . Cyst removed from left arm       as a child    Family History  Problem Relation Age of Onset  . Colon cancer Maternal Grandfather   . Alzheimer's disease Mother   . Heart Problems Father    Social History:  reports that she has never smoked. She has never used smokeless tobacco. She reports that she drinks about 0.6 oz of alcohol per week. She reports that she does not use illicit drugs.  Allergies:  Allergies  Allergen Reactions  . Demerol Nausea And Vomiting  . Topamax [Topiramate] Other (See Comments)    Patient had a lot of reactions with increased dosage of this medication, including heart rate increase.  . Lamictal [Lamotrigine] Rash    Medications: Admission medications were reviewed by me.  ROS: History obtained from the patient  General ROS: negative for - chills, fatigue, fever, night sweats, weight gain or weight loss Psychological ROS: negative for - behavioral disorder, hallucinations, memory difficulties, mood swings or suicidal ideation Ophthalmic ROS: negative for -  blurry vision, double vision, eye pain or loss of vision ENT ROS: negative for - epistaxis, nasal discharge, oral lesions, sore throat, tinnitus or vertigo Allergy and Immunology ROS: negative for - hives or itchy/watery eyes Hematological and Lymphatic ROS: negative for - bleeding problems, bruising or swollen lymph nodes Endocrine ROS: negative for - galactorrhea, hair pattern changes, polydipsia/polyuria or temperature intolerance Respiratory ROS: negative for - cough, hemoptysis, shortness of breath or wheezing Cardiovascular ROS: negative for - chest pain, dyspnea on exertion, edema or irregular heartbeat Gastrointestinal ROS: negative for - abdominal pain, diarrhea, hematemesis, nausea/vomiting or stool incontinence Genito-Urinary  ROS: negative for - dysuria, hematuria, incontinence or urinary frequency/urgency Musculoskeletal ROS: negative for - joint swelling or muscular weakness Neurological ROS: as noted in HPI Dermatological ROS: negative for rash and skin lesion changes  Physical Examination: Blood pressure 115/90, pulse 78, temperature 98 F (36.7 C), temperature source Oral, resp. rate 20, height 5\' 7"  (1.702 m), weight 84.959 kg (187 lb 4.8 oz), last menstrual period 12/18/2005, SpO2 97 %.  HEENT-  Normocephalic, no lesions, without obvious abnormality.  Normal external eye and conjunctiva.  Normal TM's bilaterally.  Normal auditory canals and external ears. Normal external nose, mucus membranes and septum.  Normal pharynx. Neck supple with no masses, nodes, nodules or enlargement. Cardiovascular - regular rate and rhythm, S1, S2 normal, no murmur, click, rub or gallop Lungs - chest clear, no wheezing, rales, normal symmetric air entry Abdomen - soft, non-tender; bowel sounds normal; no masses,  no organomegaly Extremities - no joint deformities, effusion, or inflammation, no edema and no skin discoloration   Neurologic Examination: Mental Status: Alert, oriented, thought content appropriate.  Speech slightly slurred without evidence of aphasia. Able to follow commands without difficulty. Cranial Nerves: II-Visual fields were normal. III/IV/VI-Pupils were equal and reacted. Extraocular movements were full and conjugate.    V/VII-no facial numbness and no facial weakness. VIII-normal. X-mild dysarthria. XI: trapezius strength/neck flexion strength normal bilaterally XII-midline tongue extension with normal strength. Motor: 5/5 bilaterally with normal tone and bulk Sensory: Reduced perception of tactile sensation of left face, arm and leg compared to the right side. Deep Tendon Reflexes: 2+ and symmetric. Plantars: Flexor bilaterally Cerebellar: Normal finger-to-nose testing. Carotid auscultation:  Normal  Results for orders placed or performed during the hospital encounter of 04/04/15 (from the past 48 hour(s))  I-stat troponin, ED (not at [facility])     Status: None   Collection Time: 04/04/15  9:53 PM  Result Value Ref Range   Troponin i, poc 0.00 0.00 - 0.08 ng/mL   Comment 3            Comment: Due to the release kinetics of cTnI, a negative result within the first hours of the onset of symptoms does not rule out myocardial infarction with certainty. If myocardial infarction is still suspected, repeat the test at appropriate intervals.   CBG monitoring, ED     Status: None   Collection Time: 04/04/15  9:58 PM  Result Value Ref Range   Glucose-Capillary 96 70 - 99 mg/dL   Ct Head (brain) Wo Contrast  04/04/2015   CLINICAL DATA:  Confusion, left foot numbness, code stroke  EXAM: CT HEAD WITHOUT CONTRAST  TECHNIQUE: Contiguous axial images were obtained from the base of the skull through the vertex without intravenous contrast.  COMPARISON:  07/01/2012  FINDINGS: No mass lesion. No midline shift. No acute hemorrhage or hematoma. No extra-axial fluid collections. No evidence of acute infarction. Calvarium intact.  IMPRESSION: Negative head CT. Critical Value/emergent results were called by telephone at the time of interpretation on 04/04/2015 at 9:58 pm to Dr. Nicole Kindred , who verbally acknowledged these results.   Electronically Signed   By: Skipper Cliche M.D.   On: 04/04/2015 22:03    Assessment: 54 y.o. female presenting with possible acute cerebrovascular ischemic event, TIA versus small right subcortical acute infarction.  Stroke Risk Factors - none  Plan: 1. HgbA1c, fasting lipid panel 2. MRI, MRA  of the brain without contrast 3. PT consult, OT consult, Speech consult 4. Echocardiogram 5. Carotid dopplers 6. Prophylactic therapy-Antiplatelet med: Aspirin  7. Risk factor modification 8. Telemetry monitoring  C.R. Nicole Kindred, MD [facility] [phone]  04/04/2015, 10:15 PM

## 2015-04-04 NOTE — ED Provider Notes (Signed)
CSN: [id number]     Arrival date & time 04/04/15  2123 History   First MD Initiated Contact with Patient 04/04/15 2147     Chief Complaint  Patient presents with  . Code Stroke     (Consider location/radiation/quality/duration/timing/severity/associated sxs/prior Treatment) HPI  Pt presenting with c/o episode of aphasia and ongoing dysarthria.  Pt states symptoms began while eating dinner at approx 6:30pm- she states that she was aware of what was going on, but was unable to find her words and speak.  She states the aphasia has improved, she still is having some ongoing slurred speech.  She also feels that it is difficult to swallow.  She took some xanax thinking her symptoms were due to anxiety.  She denies substance use today.  No focal weakness.  No headache.  She also notes that her feet feel numb.  There are no other associated systemic symptoms, there are no other alleviating or modifying factors.   Past Medical History  Diagnosis Date  . Allergy     takes Allegra D daily prn allergies and Flonase prn allergies  . Hiatal hernia   . Hearing impaired     deaf in right ear  . Anxiety     takes Xanax prn anxiety  . Insomnia     takes Trazodone nightly prn sleep  . Bipolar 2 disorder     takes Adderall daily  . Depression     takes Prozac daily  . Muscle spasms of neck     takes Flexeril daily prn muscle spasms  . PONV (postoperative nausea and vomiting)   . Viral infection     09/25/13-was given an inhaler d/t SOB;was on ZPak  . Shortness of breath     with exertion/sitting  . Pleurisy     when in college  . Pneumonia     walking pneumonia in college  . History of migraine     last one on 10/09/13  . Dizziness   . Weakness     both hands  . Urinary frequency   . Laryngitis   . OSA on CPAP 05/14/2014   Past Surgical History  Procedure Laterality Date  . Colonoscopy    . Abdominal hysterectomy    . Bunionectomy    . Fx nose repair    . Elbow fracture surgery Right    . Abdominal hernia repair    . Bladder  stretch    . Upper gastrointestinal endoscopy    . Tumor removed from left breast  49yrs ago  . Cyst removed from left arm       as a child   Family History  Problem Relation Age of Onset  . Colon cancer Maternal Grandfather   . Alzheimer's disease Mother   . Heart Problems Father    History  Substance Use Topics  . Smoking status: Never Smoker   . Smokeless tobacco: Never Used  . Alcohol Use: 0.6 oz/week    1 Glasses of wine per week     Comment: rarely   OB History    No data available     Review of Systems  ROS reviewed and all otherwise negative except for mentioned in HPI    Allergies  Demerol; Topamax; and Lamictal  Home Medications   Prior to Admission medications   Medication Sig Start Date End Date Taking? Authorizing Provider  ALPRAZolam Duanne Moron) 0.5 MG tablet Take 0.5-0.75 mg by mouth 2 (two) times daily as needed for anxiety or sleep (  panic attacks).  08/26/14  Yes Historical Provider, MD  amphetamine-dextroamphetamine (ADDERALL) 20 MG tablet Take 20 mg by mouth daily.    Yes Historical Provider, MD  aspirin-acetaminophen-caffeine (EXCEDRIN MIGRAINE) [phone] MG per tablet Take 1-2 tablets by mouth daily as needed for headache or migraine.   Yes Historical Provider, MD  Fexofenadine-Pseudoephedrine (ALLEGRA-D 24 HOUR PO) Take 1 tablet by mouth daily as needed (allergies).    Yes Historical Provider, MD  FLUoxetine (PROZAC) 20 MG capsule Take 20 mg by mouth daily.  04/02/15  Yes Historical Provider, MD  fluticasone (FLONASE) 50 MCG/ACT nasal spray Place 2 sprays into both nostrils daily as needed for allergies.    Yes Historical Provider, MD  traZODone (DESYREL) 100 MG tablet Take 100 mg by mouth at bedtime as needed for sleep.   Yes Historical Provider, MD  estazolam (PROSOM) 2 MG tablet Take 2 mg by mouth at bedtime.  04/02/15   Historical Provider, MD   BP 109/66 mmHg  Pulse 57  Temp(Src) 97.9 F (36.6 C) (Oral)   Resp 18  Ht 5\' 7"  (1.702 m)  Wt 183 lb 12.8 oz (83.371 kg)  BMI 28.78 kg/m2  SpO2 100%  LMP 12/18/2005  Vitals reviewed Physical Exam  Physical Examination: General appearance - alert, well appearing, and in no distress Mental status - alert, oriented to person, place, and time Eyes - pupils equal and reactive, extraocular eye movements intact Mouth - mucous membranes moist, pharynx normal without lesions Chest - clear to auscultation, no wheezes, rales or rhonchi, symmetric air entry Heart - normal rate, regular rhythm, normal S1, S2, no murmurs, rubs, clicks or gallops Abdomen - soft, nontender, nondistended, no masses or organomegaly Neurological - alert, oriented x 3, slurred speech, mild right sided facial droop, otherwise no cranial nerve deficit, strength 5/5 in extremities x 4, sensation intact, no pronator drift Extremities - peripheral pulses normal, no pedal edema, no clubbing or cyanosis Skin - normal coloration and turgor, no rashes  ED Course  Procedures (including critical care time)  CRITICAL CARE Performed by: Threasa Beards Total critical care time: 35 Critical care time was exclusive of separately billable procedures and treating other patients. Critical care was necessary to treat or prevent imminent or life-threatening deterioration. Critical care was time spent personally by me on the following activities: development of treatment plan with patient and/or surrogate as well as nursing, discussions with consultants, evaluation of patient's response to treatment, examination of patient, obtaining history from patient or surrogate, ordering and performing treatments and interventions, ordering and review of laboratory studies, ordering and review of radiographic studies, pulse oximetry and re-evaluation of patient's condition.  11:52 PM d/w Dr. Arnoldo Morale, triad for admission to tele obs bed.  MRI ordered as well.  Labs Review Labs Reviewed  COMPREHENSIVE METABOLIC  PANEL - Abnormal; Notable for the following:    Glucose, Bld 102 (*)    Total Bilirubin 1.6 (*)    GFR calc non Af Amer 63 (*)    GFR calc Af Amer 73 (*)    All other components within normal limits  HEMOGLOBIN A1C - Abnormal; Notable for the following:    Hgb A1c MFr Bld 5.7 (*)    All other components within normal limits  LIPID PANEL - Abnormal; Notable for the following:    LDL Cholesterol 119 (*)    All other components within normal limits  GLUCOSE, CAPILLARY - Abnormal; Notable for the following:    Glucose-Capillary 110 (*)    All other  components within normal limits  PROTIME-INR  APTT  CBC  DIFFERENTIAL  GLUCOSE, CAPILLARY  CBG MONITORING, ED  I-STAT TROPOININ, ED    Imaging Review Ct Head (brain) Wo Contrast  04/04/2015   CLINICAL DATA:  Confusion, left foot numbness, code stroke  EXAM: CT HEAD WITHOUT CONTRAST  TECHNIQUE: Contiguous axial images were obtained from the base of the skull through the vertex without intravenous contrast.  COMPARISON:  07/01/2012  FINDINGS: No mass lesion. No midline shift. No acute hemorrhage or hematoma. No extra-axial fluid collections. No evidence of acute infarction. Calvarium intact.  IMPRESSION: Negative head CT. Critical Value/emergent results were called by telephone at the time of interpretation on 04/04/2015 at 9:58 pm to Dr. Nicole Kindred , who verbally acknowledged these results.   Electronically Signed   By: Skipper Cliche M.D.   On: 04/04/2015 22:03   Mr Brain Wo Contrast  04/05/2015   CLINICAL DATA:  Initial evaluation for slurred speech, and numbness and distal left lower extremity. Evaluate for stroke.  EXAM: MRI HEAD WITHOUT CONTRAST  TECHNIQUE: Multiplanar, multiecho pulse sequences of the brain and surrounding structures were obtained without intravenous contrast.  COMPARISON:  Prior CT from or 04/04/2015.  FINDINGS: The CSF containing spaces are within normal limits for patient age. Few scattered subcentimeter T2/FLAIR hyperintense  foci noted within the supratentorial white matter, of doubtful clinical significance. No mass lesion, midline shift, or extra-axial fluid collection. Ventricles are normal in size without evidence of hydrocephalus.  No diffusion-weighted signal abnormality is identified to suggest acute intracranial infarct. Gray-white matter differentiation is maintained. Normal flow voids are seen within the intracranial vasculature. No intracranial hemorrhage identified.  The cervicomedullary junction is normal. Pituitary gland is within normal limits. Pituitary stalk is midline. The globes and optic nerves demonstrate a normal appearance with normal signal intensity. The  The bone marrow signal intensity is normal. Calvarium is intact. Visualized upper cervical spine is within normal limits.  Scalp soft tissues are unremarkable.  Paranasal sinuses are clear.  No mastoid effusion.  IMPRESSION: Normal brain MRI with no acute intracranial infarct or other abnormality identified.   Electronically Signed   By: Jeannine Boga M.D.   On: 04/05/2015 01:23     EKG Interpretation   Date/Time:  Sunday April 04 2015 21:31:01 EDT Ventricular Rate:  73 PR Interval:  170 QRS Duration: 74 QT Interval:  416 QTC Calculation: 458 R Axis:   71 Text Interpretation:  Normal sinus rhythm Cannot rule out Anterior infarct  , age undetermined Abnormal ECG No significant change since last tracing  Confirmed by [facility]  MD, Hilary Pundt [phone]) on 04/04/2015 10:06:32 PM      MDM   Final diagnoses:  Aphasia  Transient cerebral ischemia, unspecified transient cerebral ischemia type  Pt was seen in triage and code stroke initiated.  Neurology has seen patient, NIH stroke scale is 2, not a candidate for TPA.  Neurology feels this may represent a TIA versus a mild acute stroke versus panic reaction.  Head CT is reassuring.  Labs reassuring as well.  Pt to be admitted for further TIa/CVA workup.  Pt no longer has chest pain in the ED.        Alfonzo Beers, MD 04/06/15 [phone]

## 2015-04-04 NOTE — ED Notes (Signed)
The pt was at dinner at 1830 whe rthe pt started having difficulty getting her words out    Her speech was  Slurred and she was having difficulty swallowing her saliva.  At present her speech is  Slurred but she took a xanax and  Trazadone.  Her feet are numb.  No facial droop  No arm drift..  Unsteady gait.  Dr linker has seen her  In triage and called a code stroke.  Pt taken to the c-t scanner.  Hx of panic attacks

## 2015-04-05 ENCOUNTER — Observation Stay (HOSPITAL_COMMUNITY): Payer: BLUE CROSS/BLUE SHIELD

## 2015-04-05 DIAGNOSIS — F319 Bipolar disorder, unspecified: Secondary | ICD-10-CM | POA: Diagnosis present

## 2015-04-05 DIAGNOSIS — R4701 Aphasia: Secondary | ICD-10-CM

## 2015-04-05 DIAGNOSIS — G4733 Obstructive sleep apnea (adult) (pediatric): Secondary | ICD-10-CM

## 2015-04-05 DIAGNOSIS — G459 Transient cerebral ischemic attack, unspecified: Secondary | ICD-10-CM

## 2015-04-05 DIAGNOSIS — F3130 Bipolar disorder, current episode depressed, mild or moderate severity, unspecified: Secondary | ICD-10-CM

## 2015-04-05 DIAGNOSIS — I517 Cardiomegaly: Secondary | ICD-10-CM

## 2015-04-05 HISTORY — DX: Cardiomegaly: I51.7

## 2015-04-05 LAB — LIPID PANEL
CHOLESTEROL: 188 mg/dL (ref 0–200)
HDL: 62 mg/dL (ref >39)
LDL CALC: 119 mg/dL — AB (ref 0–99)
TRIGLYCERIDES: 36 mg/dL (ref <150)
Total CHOL/HDL Ratio: 3 RATIO
VLDL: 7 mg/dL (ref 0–40)

## 2015-04-05 LAB — GLUCOSE, CAPILLARY
GLUCOSE-CAPILLARY: 110 mg/dL — AB (ref 70–99)
Glucose-Capillary: 77 mg/dL (ref 70–99)

## 2015-04-05 MED ORDER — ENOXAPARIN SODIUM 40 MG/0.4ML ~~LOC~~ SOLN
40.0000 mg | Freq: Every day | SUBCUTANEOUS | Status: DC
  Administered 2015-04-05: 40 mg via SUBCUTANEOUS
  Filled 2015-04-05: qty 0.4

## 2015-04-05 MED ORDER — ASPIRIN 325 MG PO TABS: 325.0000 mg | ORAL_TABLET | Freq: Every day | ORAL | Status: DC

## 2015-04-05 MED ORDER — STROKE: EARLY STAGES OF RECOVERY BOOK: Freq: Once | Status: DC

## 2015-04-05 MED ORDER — ACETAMINOPHEN 325 MG PO TABS: 650.0000 mg | ORAL_TABLET | ORAL | Status: DC | PRN

## 2015-04-05 MED ORDER — ATORVASTATIN CALCIUM 10 MG PO TABS
10.0000 mg | ORAL_TABLET | Freq: Every day | ORAL | Status: DC
  Filled 2015-04-05: qty 1

## 2015-04-05 MED ORDER — FLUOXETINE HCL 20 MG PO CAPS
20.0000 mg | ORAL_CAPSULE | Freq: Every day | ORAL | Status: DC
  Administered 2015-04-05: 20 mg via ORAL
  Filled 2015-04-05: qty 1

## 2015-04-05 MED ORDER — ALPRAZOLAM 0.5 MG PO TABS: 0.5000 mg | ORAL_TABLET | Freq: Two times a day (BID) | ORAL | Status: DC | PRN

## 2015-04-05 MED ORDER — FLUTICASONE PROPIONATE 50 MCG/ACT NA SUSP: 2.0000 | Freq: Every day | NASAL | Status: DC | PRN

## 2015-04-05 MED ORDER — AMPHETAMINE-DEXTROAMPHETAMINE 10 MG PO TABS
20.0000 mg | ORAL_TABLET | Freq: Every day | ORAL | Status: DC
  Administered 2015-04-05: 20 mg via ORAL
  Filled 2015-04-05: qty 2

## 2015-04-05 MED ORDER — TRAZODONE HCL 100 MG PO TABS: 100.0000 mg | ORAL_TABLET | Freq: Every evening | ORAL | Status: DC | PRN

## 2015-04-05 NOTE — Discharge Summary (Signed)
PATIENT DETAILS Name: Natalie Le Age: 54 y.o. Sex: female Date of Birth: 29-Sep-1961 MRN: [id number]. Admitting Physician: Theressa Millard, MD WKG:[id number],XYVOPF G, MD  Admit Date: 04/04/2015 Discharge date: 04/05/2015  Recommendations for Outpatient Follow-up:  1. Ensure follow up with Psychiatry  PRIMARY DISCHARGE DIAGNOSIS:  Principal Problem:   TIA (transient ischemic attack) vs Panic Attack Active Problems:   OSA on CPAP   Aphasia   Bipolar disorder      PAST MEDICAL HISTORY: Past Medical History  Diagnosis Date  . Allergy     takes Allegra D daily prn allergies and Flonase prn allergies  . Hiatal hernia   . Hearing impaired     deaf in right ear  . Anxiety     takes Xanax prn anxiety  . Insomnia     takes Trazodone nightly prn sleep  . Bipolar 2 disorder     takes Adderall daily  . Depression     takes Prozac daily  . Muscle spasms of neck     takes Flexeril daily prn muscle spasms  . PONV (postoperative nausea and vomiting)   . Viral infection     09/25/13-was given an inhaler d/t SOB;was on ZPak  . Shortness of breath     with exertion/sitting  . Pleurisy     when in college  . Pneumonia     walking pneumonia in college  . History of migraine     last one on 10/09/13  . Dizziness   . Weakness     both hands  . Urinary frequency   . Laryngitis   . OSA on CPAP 05/14/2014    DISCHARGE MEDICATIONS: Current Discharge Medication List    CONTINUE these medications which have NOT CHANGED   Details  ALPRAZolam (XANAX) 0.5 MG tablet Take 0.5-0.75 mg by mouth 2 (two) times daily as needed for anxiety or sleep (panic attacks).  Refills: 1    amphetamine-dextroamphetamine (ADDERALL) 20 MG tablet Take 20 mg by mouth daily.     aspirin-acetaminophen-caffeine (EXCEDRIN MIGRAINE) 250-250-65 MG per tablet Take 1-2 tablets by mouth daily as needed for headache or migraine.    Fexofenadine-Pseudoephedrine (ALLEGRA-D 24 HOUR PO) Take 1 tablet by mouth  daily as needed (allergies).     FLUoxetine (PROZAC) 20 MG capsule Take 20 mg by mouth daily.  Refills: 0    fluticasone (FLONASE) 50 MCG/ACT nasal spray Place 2 sprays into both nostrils daily as needed for allergies.     traZODone (DESYREL) 100 MG tablet Take 100 mg by mouth at bedtime as needed for sleep.    estazolam (PROSOM) 2 MG tablet Take 2 mg by mouth at bedtime.  Refills: 0        ALLERGIES:   Allergies  Allergen Reactions  . Demerol Nausea And Vomiting  . Topamax [Topiramate] Other (See Comments)    Fluctuating blood pressure, increased heart rate  . Lamictal [Lamotrigine] Rash    BRIEF HPI:  See H&P, Labs, Consult and Test reports for all details in brief, patient is a 54 year old female with history of anxiety, bipolar disorder, obstructive sleep apnea who presented with left lower extremity weakness, speech finding difficulty, numbness, shortness of breath and chest tightness. She was such been admitted for further evaluation and treatment  CONSULTATIONS:   neurology  PERTINENT RADIOLOGIC STUDIES: Ct Head (brain) Wo Contrast  04/04/2015   CLINICAL DATA:  Confusion, left foot numbness, code stroke  EXAM: CT HEAD WITHOUT CONTRAST  TECHNIQUE: Contiguous axial images were  obtained from the base of the skull through the vertex without intravenous contrast.  COMPARISON:  07/01/2012  FINDINGS: No mass lesion. No midline shift. No acute hemorrhage or hematoma. No extra-axial fluid collections. No evidence of acute infarction. Calvarium intact.  IMPRESSION: Negative head CT. Critical Value/emergent results were called by telephone at the time of interpretation on 04/04/2015 at 9:58 pm to Dr. Nicole Kindred , who verbally acknowledged these results.   Electronically Signed   By: Skipper Cliche M.D.   On: 04/04/2015 22:03   Mr Brain Wo Contrast  04/05/2015   CLINICAL DATA:  Initial evaluation for slurred speech, and numbness and distal left lower extremity. Evaluate for stroke.  EXAM:  MRI HEAD WITHOUT CONTRAST  TECHNIQUE: Multiplanar, multiecho pulse sequences of the brain and surrounding structures were obtained without intravenous contrast.  COMPARISON:  Prior CT from or 04/04/2015.  FINDINGS: The CSF containing spaces are within normal limits for patient age. Few scattered subcentimeter T2/FLAIR hyperintense foci noted within the supratentorial white matter, of doubtful clinical significance. No mass lesion, midline shift, or extra-axial fluid collection. Ventricles are normal in size without evidence of hydrocephalus.  No diffusion-weighted signal abnormality is identified to suggest acute intracranial infarct. Gray-white matter differentiation is maintained. Normal flow voids are seen within the intracranial vasculature. No intracranial hemorrhage identified.  The cervicomedullary junction is normal. Pituitary gland is within normal limits. Pituitary stalk is midline. The globes and optic nerves demonstrate a normal appearance with normal signal intensity. The  The bone marrow signal intensity is normal. Calvarium is intact. Visualized upper cervical spine is within normal limits.  Scalp soft tissues are unremarkable.  Paranasal sinuses are clear.  No mastoid effusion.  IMPRESSION: Normal brain MRI with no acute intracranial infarct or other abnormality identified.   Electronically Signed   By: Jeannine Boga M.D.   On: 04/05/2015 01:23     PERTINENT LAB RESULTS: CBC:  Recent Labs  04/04/15 2133  WBC 6.3  HGB 12.5  HCT 37.1  PLT 246   CMET CMP     Component Value Date/Time   NA 136 04/04/2015 2133   K 3.7 04/04/2015 2133   CL 104 04/04/2015 2133   CO2 20 04/04/2015 2133   GLUCOSE 102* 04/04/2015 2133   BUN 17 04/04/2015 2133   CREATININE 0.99 04/04/2015 2133   CALCIUM 9.3 04/04/2015 2133   PROT 6.8 04/04/2015 2133   ALBUMIN 3.9 04/04/2015 2133   AST 19 04/04/2015 2133   ALT 14 04/04/2015 2133   ALKPHOS 73 04/04/2015 2133   BILITOT 1.6* 04/04/2015 2133    GFRNONAA 63* 04/04/2015 2133   GFRAA 73* 04/04/2015 2133    GFR Estimated Creatinine Clearance: 71.3 mL/min (by C-G formula based on Cr of 0.99). No results for input(s): LIPASE, AMYLASE in the last 72 hours. No results for input(s): CKTOTAL, CKMB, CKMBINDEX, TROPONINI in the last 72 hours. Invalid input(s): POCBNP No results for input(s): DDIMER in the last 72 hours. No results for input(s): HGBA1C in the last 72 hours.  Recent Labs  04/05/15 0746  CHOL 188  HDL 62  LDLCALC 119*  TRIG 36  CHOLHDL 3.0   No results for input(s): TSH, T4TOTAL, T3FREE, THYROIDAB in the last 72 hours.  Invalid input(s): FREET3 No results for input(s): VITAMINB12, FOLATE, FERRITIN, TIBC, IRON, RETICCTPCT in the last 72 hours. Coags:  Recent Labs  04/04/15 2133  INR 1.10   Microbiology: No results found for this or any previous visit (from the past 240 hour(s)).  BRIEF HOSPITAL COURSE:   Principal Problem:   TIA (transient ischemic attack) vs panic attack:patient was admitted, underwent workup involving a CT head which was negative, subsequently MRI of the brain was done which was negative. The echocardiogram showed preserved ejection fraction without any source of embolism, carotid Dopplers were negative for any significant stenosis. Patient was also evaluated by the stroke team, who felt her symptoms were most consistent with a panic attack rather than TIA. Dr. Leonie Man does not recommend any further antiplatelet. Recommendations are for outpatient psychiatric follow-up.  Active Problems:   OSA on CPAP: Continue    Anxiety with  Bipolar disorder: Continue current medications, and short outpatient follow-up with psychiatry      TODAY-DAY OF DISCHARGE:  Subjective:   Natalie Le today has no headache,no chest abdominal pain,no new weakness tingling or numbness, feels much better wants to go home today.   Objective:   Blood pressure 109/66, pulse 57, temperature 97.9 F (36.6 C),  temperature source Oral, resp. rate 18, height 5\' 7"  (1.702 m), weight 83.371 kg (183 lb 12.8 oz), last menstrual period 12/18/2005, SpO2 100 %. No intake or output data in the 24 hours ending 04/05/15 1311 Filed Weights   04/04/15 2131 04/05/15 0215  Weight: 84.959 kg (187 lb 4.8 oz) 83.371 kg (183 lb 12.8 oz)    Exam Awake Alert, Oriented *3, No new F.N deficits, Normal affect Felt.AT,PERRAL Supple Neck,No JVD, No cervical lymphadenopathy appriciated.  Symmetrical Chest wall movement, Good air movement bilaterally, CTAB RRR,No Gallops,Rubs or new Murmurs, No Parasternal Heave +ve B.Sounds, Abd Soft, Non tender, No organomegaly appriciated, No rebound -guarding or rigidity. No Cyanosis, Clubbing or edema, No new Rash or bruise  DISCHARGE CONDITION: Stable  DISPOSITION: Home  DISCHARGE INSTRUCTIONS:    Activity:  As tolerated  Diet recommendation: Heart Healthy diet  Discharge Instructions    Call MD for:  extreme fatigue    Complete by:  As directed      Call MD for:  persistant dizziness or light-headedness    Complete by:  As directed      Diet - low sodium heart healthy    Complete by:  As directed      Increase activity slowly    Complete by:  As directed            Follow-up Information    Follow up with Donnajean Lopes, MD. Schedule an appointment as soon as possible for a visit in 1 week.   Specialty:  Internal Medicine   Contact information:   [address] [place] [address] [phone]         Total Time spent on discharge equals 45 minutes.  SignedOren Binet 04/05/2015 1:11 PM

## 2015-04-05 NOTE — Progress Notes (Signed)
Patient arrived to 4N08,alert and oriented. Patient oriented to room and equipment. Cardiac monitoring initiated. MAEW. VSS. Will monitor closely  Overnight.

## 2015-04-05 NOTE — H&P (Addendum)
[facility] Admission History and Physical       Natalie Le:[id number] DOB: 12-25-1960 DOA: 04/04/2015  Referring physician: EDP PCP: Donnajean Lopes, MD  Specialists:   Chief Complaint: Difficulty Speaking and Left Leg Numbness  HPI: Natalie Le is a 54 y.o. female with a history of Bipolar, Anxiety, and OSA who presents with complaints of sudden onset of aphasia at 6:30 pm , shortly thereafter she began to have numbness and weakness in her left leg.  The aphasia resolved but then she had word finding difficulty and slurring of her speech.    She attributed the slurring of her speech to her medications that she had taken before the onset of her symptoms.  She had taken a Xanax and a Trazodone.   When she presented to the ED, she was seen as a possible Code Stroke, her NIH score was 2, and a CT scan of the /head was performed and was negative for acute findings.   She was seen by Neurology and sent for an MRI/MRA of the brain.  Her symptoms resolved while in the ED, and had lasted about 2.5 hours.     Review of Systems: Constitutional: No Weight Loss, No Weight Gain, Night Sweats, Fevers, Chills, Dizziness, Light Headedness, Fatigue, or Generalized Weakness HEENT: No Headaches, Difficulty Swallowing,Tooth/Dental Problems,Sore Throat,  No Sneezing, Rhinitis, Ear Ache, Nasal Congestion, or Post Nasal Drip,  Cardio-vascular:  No Chest pain, Orthopnea, PND, Edema in Lower Extremities, Anasarca, +Dizziness, Palpitations  Resp: No Dyspnea, No DOE, No Productive Cough, No Non-Productive Cough, No Hemoptysis, No Wheezing.    GI: No Heartburn, Indigestion, Abdominal Pain, Nausea, Vomiting, Diarrhea, Constipation, Hematemesis, Hematochezia, Melena, Change in Bowel Habits,  Loss of Appetite  GU: No Dysuria, No Change in Color of Urine, No Urgency or Urinary Frequency, No Flank pain.  Musculoskeletal: No Joint Pain or Swelling, No Decreased Range of Motion, No Back Pain.    Neurologic: No Syncope, No Seizures, Muscle Weakness, +Paresthesia of Left Leg, Vision Disturbance or Loss, No Diplopia, +Deafness Right Ear, +Dysarthria, +Aphasia,  No Vertigo, No Difficulty Walking,  Skin: No Rash or Lesions. Psych: No Change in Mood or Affect, No Depression or Anxiety, No Memory loss, No Confusion, or Hallucinations   Past Medical History  Diagnosis Date  . Allergy     takes Allegra D daily prn allergies and Flonase prn allergies  . Hiatal hernia   . Hearing impaired     deaf in right ear  . Anxiety     takes Xanax prn anxiety  . Insomnia     takes Trazodone nightly prn sleep  . Bipolar 2 disorder     takes Adderall daily  . Depression     takes Prozac daily  . Muscle spasms of neck     takes Flexeril daily prn muscle spasms  . PONV (postoperative nausea and vomiting)   . Viral infection     09/25/13-was given an inhaler d/t SOB;was on ZPak  . Shortness of breath     with exertion/sitting  . Pleurisy     when in college  . Pneumonia     walking pneumonia in college  . History of migraine     last one on 10/09/13  . Dizziness   . Weakness     both hands  . Urinary frequency   . Laryngitis   . OSA on CPAP 05/14/2014     Past Surgical History  Procedure Laterality Date  . Colonoscopy    .  Abdominal hysterectomy    . Bunionectomy    . Fx nose repair    . Elbow fracture surgery Right   . Abdominal hernia repair    . Bladder  stretch    . Upper gastrointestinal endoscopy    . Tumor removed from left breast  73yrs ago  . Cyst removed from left arm       as a child      Prior to Admission medications   Medication Sig Start Date End Date Taking? Authorizing Provider  ALPRAZolam Duanne Moron) 0.5 MG tablet Take 0.5-0.75 mg by mouth 2 (two) times daily as needed for anxiety or sleep (panic attacks).  08/26/14  Yes Historical Provider, MD  amphetamine-dextroamphetamine (ADDERALL) 20 MG tablet Take 20 mg by mouth daily.    Yes Historical Provider, MD   aspirin-acetaminophen-caffeine (EXCEDRIN MIGRAINE) [phone] MG per tablet Take 1-2 tablets by mouth daily as needed for headache or migraine.   Yes Historical Provider, MD  Fexofenadine-Pseudoephedrine (ALLEGRA-D 24 HOUR PO) Take 1 tablet by mouth daily as needed (allergies).    Yes Historical Provider, MD  FLUoxetine (PROZAC) 20 MG capsule Take 20 mg by mouth daily.  04/02/15  Yes Historical Provider, MD  fluticasone (FLONASE) 50 MCG/ACT nasal spray Place 2 sprays into both nostrils daily as needed for allergies.    Yes Historical Provider, MD  traZODone (DESYREL) 100 MG tablet Take 100 mg by mouth at bedtime as needed for sleep.   Yes Historical Provider, MD  estazolam (PROSOM) 2 MG tablet Take 2 mg by mouth at bedtime.  04/02/15   Historical Provider, MD     Allergies  Allergen Reactions  . Demerol Nausea And Vomiting  . Topamax [Topiramate] Other (See Comments)    Fluctuating blood pressure, increased heart rate  . Lamictal [Lamotrigine] Rash    Social History:  reports that she has never smoked. She has never used smokeless tobacco. She reports that she drinks about 0.6 oz of alcohol per week. She reports that she does not use illicit drugs.    Family History  Problem Relation Age of Onset  . Colon cancer Maternal Grandfather   . Alzheimer's disease Mother   . Heart Problems Father        Physical Exam:  GEN:  Pleasant Well Nourished and Well Developed  54 y.o. Caucasian female examined and in no acute distress; cooperative with exam Filed Vitals:   04/04/15 2247 04/04/15 2300 04/04/15 2315 04/04/15 2330  BP:  107/70 116/73 102/70  Pulse:  59 49 48  Temp: 97.7 F (36.5 C)     TempSrc: Oral     Resp:  17 18 12   Height:      Weight:      SpO2:  100% 98% 97%   Blood pressure 102/70, pulse 48, temperature 97.7 F (36.5 C), temperature source Oral, resp. rate 12, height 5\' 7"  (1.702 m), weight 84.959 kg (187 lb 4.8 oz), last menstrual period 12/18/2005, SpO2 97 %. PSYCH:  She is alert and oriented x4; does not appear anxious does not appear depressed; affect is normal HEENT: Normocephalic and Atraumatic, Mucous membranes pink; PERRLA; EOM intact; Fundi:  Benign;  No scleral icterus, Nares: Patent, Oropharynx: Clear, Fair Dentition,    Neck:  FROM, No Cervical Lymphadenopathy nor Thyromegaly or Carotid Bruit; No JVD; Breasts:: Not examined CHEST WALL: No tenderness CHEST: Normal respiration, clear to auscultation bilaterally HEART: Regular rate and rhythm; no murmurs rubs or gallops BACK: No kyphosis or scoliosis; No CVA tenderness  ABDOMEN: Positive Bowel Sounds, Obese, Soft Non-Tender, No Rebound or Guarding; No Masses, No Organomegaly. Rectal Exam: Not done EXTREMITIES: No Cyanosis, Clubbing, or Edema; No Ulcerations. Genitalia: not examined PULSES: 2+ and symmetric SKIN: Normal hydration no rash or ulceration CNS:  Alert and Oriented x 4, No Focal Deficits Mental Status:  Alert, Oriented, Thought Content Appropriate. Speech Fluent without evidence of Aphasia. Able to follow 3 step commands without difficulty.  In No obvious pain.   Cranial Nerves:  II: Discs flat bilaterally; Visual fields Intact, Pupils equal and reactive.    III,IV, VI: Extra-ocular motions intact bilaterally    V,VII: smile symmetric, facial light touch sensation normal bilaterally    VIII: hearing decreasesd on right    IX,X: gag reflex present    XI: bilateral shoulder shrug    XII: midline tongue extension   Motor:  Right:  Upper extremity 5/5     Left:  Upper extremity 5/5     Right:  Lower extremity 5/5    Left:  Lower extremity 5/5     Tone and Bulk:  normal tone throughout; no atrophy noted   Sensory:  Pinprick and light touch intact throughout, bilaterally   Deep Tendon Reflexes: 2+ and symmetric throughout   Plantars/ Babinski:  Right:  normal Left: normal    Cerebellar:  Finger to nose without difficulty.   Gait: deferred    Vascular: pulses palpable throughout      Labs on Admission:  Basic Metabolic Panel:  Recent Labs Lab 04/04/15 2133  NA 136  K 3.7  CL 104  CO2 20  GLUCOSE 102*  BUN 17  CREATININE 0.99  CALCIUM 9.3   Liver Function Tests:  Recent Labs Lab 04/04/15 2133  AST 19  ALT 14  ALKPHOS 73  BILITOT 1.6*  PROT 6.8  ALBUMIN 3.9   No results for input(s): LIPASE, AMYLASE in the last 168 hours. No results for input(s): AMMONIA in the last 168 hours. CBC:  Recent Labs Lab 04/04/15 2133  WBC 6.3  NEUTROABS 3.6  HGB 12.5  HCT 37.1  MCV 88.1  PLT 246   Cardiac Enzymes: No results for input(s): CKTOTAL, CKMB, CKMBINDEX, TROPONINI in the last 168 hours.  BNP (last 3 results) No results for input(s): BNP in the last 8760 hours.  ProBNP (last 3 results) No results for input(s): PROBNP in the last 8760 hours.  CBG:  Recent Labs Lab 04/04/15 2158  GLUCAP 96    Radiological Exams on Admission: Ct Head (brain) Wo Contrast  04/04/2015   CLINICAL DATA:  Confusion, left foot numbness, code stroke  EXAM: CT HEAD WITHOUT CONTRAST  TECHNIQUE: Contiguous axial images were obtained from the base of the skull through the vertex without intravenous contrast.  COMPARISON:  07/01/2012  FINDINGS: No mass lesion. No midline shift. No acute hemorrhage or hematoma. No extra-axial fluid collections. No evidence of acute infarction. Calvarium intact.  IMPRESSION: Negative head CT. Critical Value/emergent results were called by telephone at the time of interpretation on 04/04/2015 at 9:58 pm to Dr. Nicole Kindred , who verbally acknowledged these results.   Electronically Signed   By: Skipper Cliche M.D.   On: 04/04/2015 22:03     EKG: Independently reviewed. Normal Sinus Rhythm Rate = 76 Q waves in V1 and V2    Old Septal Infarct   Assessment/Plan:   54 y.o. female with  Principal Problem:   1.    TIA (transient ischemic attack)/ Aphasia   Telemetry Monitoring  CT Head and MRI/MRA Brain done   Neuro Checks   Carotid US and 2 D  ECHO in AM   Fasting Lipids and HbA1C.      Seen By Neuro in ED   Add Atorvastatin Rx   Active Problems:   2.    Bipolar Disorder   On Adderall and Prozac Rx     3.     OSA   CPAP qhs     4.    DVT Prophylaxis   Lovenox    Code Status:     FULL CODE       Family Communication:  No Family Present    Disposition Plan:    Observation Status        Time spent:  [place] [facility] Pager [phone]   If [place] Please Contact the Day Rounding Team MD for [facility]  If 7PM-7AM, Please Contact Night-Floor Coverage  [email or web address] Password [email or web address] 04/05/2015, 12:26 AM     ADDENDUM:   Patient was seen and examined on 04/05/2015

## 2015-04-05 NOTE — Progress Notes (Addendum)
STROKE TEAM PROGRESS NOTE   HISTORY Natalie Le is an 54 y.o. female with a history of bipolar disorder, anxiety, hearing impairment and obstructive sleep apnea, presenting with slurred speech, transient word finding difficulty and numbness involving distal left lower extremity. LKW: 6:30 PM on 04/04/2015. Patient attributes of slurred speech to trazodone and Flexeril which she takes nightly. Speech output difficulty occurred prior to taking these medications and has since resolved. Lower extremity numbness is also resolved. She is also complaining of shortness of breath as well as a feeling of tightness in her chest. CT scan of her head showed no acute intracranial abnormality. She has not been on antiplatelet therapy. NIH stroke score was 2 at the time of this evaluation. Patient was not administered TPA secondary to resolving deficits. She was admitted for further evaluation and treatment.   SUBJECTIVE (INTERVAL HISTORY) No family is at the bedside.  Overall she feels her condition is gradually improving. She complained of not being able to breath yesterday. States it did not feel like a panic attack (she has had previously). Said she took a xanax wtihout relief. Lasted several hours. She took [place] yesterday, unsure if she had taken previously.   OBJECTIVE Temp:  [97.7 F (36.5 C)-98.1 F (36.7 C)] 97.9 F (36.6 C) (04/18 0600) Pulse Rate:  [48-78] 54 (04/18 0600) Cardiac Rhythm:  [-] Sinus bradycardia (04/18 0215) Resp:  [12-20] 18 (04/18 0600) BP: (93-121)/(60-90) 100/60 mmHg (04/18 0600) SpO2:  [96 %-100 %] 100 % (04/18 0600) Weight:  [83.371 kg (183 lb 12.8 oz)-84.959 kg (187 lb 4.8 oz)] 83.371 kg (183 lb 12.8 oz) (04/18 0215)   Recent Labs Lab 04/04/15 2158 04/05/15 0702  GLUCAP 96 77    Recent Labs Lab 04/04/15 2133  NA 136  K 3.7  CL 104  CO2 20  GLUCOSE 102*  BUN 17  CREATININE 0.99  CALCIUM 9.3    Recent Labs Lab 04/04/15 2133  AST 19  ALT 14   ALKPHOS 73  BILITOT 1.6*  PROT 6.8  ALBUMIN 3.9    Recent Labs Lab 04/04/15 2133  WBC 6.3  NEUTROABS 3.6  HGB 12.5  HCT 37.1  MCV 88.1  PLT 246   No results for input(s): CKTOTAL, CKMB, CKMBINDEX, TROPONINI in the last 168 hours.  Recent Labs  04/04/15 2133  LABPROT 14.4  INR 1.10   No results for input(s): COLORURINE, LABSPEC, PHURINE, GLUCOSEU, HGBUR, BILIRUBINUR, KETONESUR, PROTEINUR, UROBILINOGEN, NITRITE, LEUKOCYTESUR in the last 72 hours.  Invalid input(s): APPERANCEUR     Component Value Date/Time   CHOL 188 04/05/2015 0746   TRIG 36 04/05/2015 0746   HDL 62 04/05/2015 0746   CHOLHDL 3.0 04/05/2015 0746   VLDL 7 04/05/2015 0746   LDLCALC 119* 04/05/2015 0746   Lab Results  Component Value Date   HGBA1C 5.7* 07/01/2012   No results found for: LABOPIA, COCAINSCRNUR, LABBENZ, AMPHETMU, THCU, LABBARB  No results for input(s): ETH in the last 168 hours.  Ct Head (brain) Wo Contrast 04/04/2015   Negative head CT.   Mr Brain Wo Contrast 04/05/2015   Normal brain MRI with no acute intracranial infarct or other abnormality identified.     Carotid Doppler  There is 1-39% bilateral ICA stenosis. Vertebral artery flow is antegrade.     PHYSICAL EXAM Pleasant middle-aged Caucasian lady currently not in distress. Appears anxious. . Afebrile. Head is nontraumatic. Neck is supple without bruit.    Cardiac exam no murmur or gallop. Lungs are clear  to auscultation. Distal pulses are well felt. Neurological Exam ;  Awake  Alert oriented x 3. Normal speech and language.eye movements full without nystagmus.fundi were not visualized. Vision acuity and fields appear normal. Hearing is normal. Palatal movements are normal. Face symmetric. Tongue midline. Normal strength, tone, reflexes and coordination. Normal sensation. Gait deferred. ASSESSMENT/PLAN Natalie Le is a 54 y.o. female with history of bipolar disorder, anxiety, hearing impairment and obstructive sleep  apnea presenting with slurred speech, transient word finding difficulty and numbness distal left lower extremity.   She did not receive IV t-PA due to resolving deficits.   Slurred speech, transient word finding difficulty and numbness distal left lower extremity, an atypical presentation of known Anxiety disorder. Not felt to be stroke or TIA.  Resultant  Neuro deficits resolved  MRI  No acute stroke  Carotid Doppler  No significant stenosis   2D Echo  pending   LDL 119. New lipitor added this admission. No stroke related LDL goals.  HgbA1c pending  Lovenox 40 mg sq daily for VTE prophylaxis Diet regular Room service appropriate?: Yes; Fluid consistency:: Thin  no antithrombotic prior to admission, now on aspirin 325 mg orally every day. No indication to continue at discharge. Will discontinue.  Therapy recommendations:  No therapy needs  Disposition:  Return home NO FURTHER STROKE WORKUP INDICATED Ongoing risk factor control by Primary Care Physician Stroke Service will sign off. Please call should any needs arise. No stroke Follow-up indicated  Other Stroke Risk Factors  Occasional ETOH use  Migraines  Obstructive sleep apnea, on CPAP at home  Other Pertinent History  Deaf R ear  Bipolar  Depression/anxiety. Treated by psychiatry  Hospital day #   [place] for Pager information 04/05/2015 10:35 AM   I have personally examined this patient, reviewed notes, independently viewed imaging studies, participated in medical decision making and plan of care. I have made any additions or clarifications directly to the above note. Agree with note above.  She presented with transient left-sided numbness in the setting of chest discomfort chest pain and hip pain with a nonfocal exam and negative brain MRI May making stroke and less likely diagnosis and possibly underlying anxiety/panic more likely. She however is at risk for strokes and  TIAs and recommend finishing ongoing stroke evaluation. May consider aspirin 81 mg daily. Long discussion with patient regarding her presentation, personally reviewed imaging studies and test results and answered questions.  Antony Contras, MD Medical Director [place] Pager: [phone] 04/05/2015 1:30 PM   To contact Stroke Continuity provider, please refer to [email or web address] After hours, contact General Neurology

## 2015-04-05 NOTE — Progress Notes (Signed)
Met with patient to review discharge instructions. She verbalized understanding. No change to medication regimen.  Advised patient that Dr. Shon Baton office will call her tomorrow to schedule follow up appointment. Iv and telemetry removed. Patient was escorted to lobby via wheelchair and transported home by sister. Discharged from [place] at 1515.

## 2015-04-05 NOTE — Progress Notes (Signed)
UR completed 

## 2015-04-05 NOTE — Progress Notes (Signed)
VASCULAR LAB PRELIMINARY  PRELIMINARY  PRELIMINARY  PRELIMINARY  Carotid Doppler completed.    Preliminary report:  There is 1-39% ICA stenosis.  Vertebral artery flow is antegrade.   Jazleen Robeck, RVT 04/05/2015, 8:32 AM

## 2015-04-05 NOTE — Progress Notes (Signed)
  Echocardiogram 2D Echocardiogram has been performed.  Natalie Le 04/05/2015, 8:56 AM

## 2015-04-06 LAB — HEMOGLOBIN A1C
Hgb A1c MFr Bld: 5.7 % — ABNORMAL HIGH (ref 4.8–5.6)
Mean Plasma Glucose: 117 mg/dL

## 2015-04-19 DIAGNOSIS — F419 Anxiety disorder, unspecified: Secondary | ICD-10-CM | POA: Insufficient documentation

## 2015-04-19 DIAGNOSIS — R002 Palpitations: Secondary | ICD-10-CM | POA: Insufficient documentation

## 2015-04-21 ENCOUNTER — Telehealth: Payer: Self-pay | Admitting: Cardiovascular Disease

## 2015-04-21 NOTE — Telephone Encounter (Signed)
Received records from [facility] for appointment on 05/27/15 with Dr Claiborne Billings.  Records given to [facility] (medical records) for Dr Evette Georges schedule on 05/27/15. lp

## 2015-05-27 ENCOUNTER — Ambulatory Visit: Payer: BLUE CROSS/BLUE SHIELD | Admitting: Cardiovascular Disease

## 2015-07-02 DIAGNOSIS — M5030 Other cervical disc degeneration, unspecified cervical region: Secondary | ICD-10-CM | POA: Insufficient documentation

## 2015-12-02 DIAGNOSIS — R059 Cough, unspecified: Secondary | ICD-10-CM | POA: Insufficient documentation

## 2015-12-19 HISTORY — PX: NECK SURGERY: SHX720

## 2016-04-19 ENCOUNTER — Other Ambulatory Visit: Payer: Self-pay | Admitting: Internal Medicine

## 2016-04-19 ENCOUNTER — Ambulatory Visit
Admission: RE | Admit: 2016-04-19 | Discharge: 2016-04-19 | Disposition: A | Discharge status: Home / Self Care | Payer: 59 | Source: Ambulatory Visit | Attending: Internal Medicine | Admitting: Internal Medicine

## 2016-04-19 DIAGNOSIS — R410 Disorientation, unspecified: Secondary | ICD-10-CM | POA: Insufficient documentation

## 2016-04-19 DIAGNOSIS — R5383 Other fatigue: Secondary | ICD-10-CM

## 2016-04-19 DIAGNOSIS — H9319 Tinnitus, unspecified ear: Secondary | ICD-10-CM | POA: Insufficient documentation

## 2016-04-19 DIAGNOSIS — R29810 Facial weakness: Secondary | ICD-10-CM | POA: Insufficient documentation

## 2016-04-19 DIAGNOSIS — R4701 Aphasia: Secondary | ICD-10-CM

## 2016-04-19 MED ORDER — GADOBENATE DIMEGLUMINE 529 MG/ML IV SOLN
17.0000 mL | Freq: Once | INTRAVENOUS | Status: AC | PRN
  Administered 2016-04-19: 17 mL via INTRAVENOUS

## 2016-04-26 ENCOUNTER — Ambulatory Visit (INDEPENDENT_AMBULATORY_CARE_PROVIDER_SITE_OTHER): Payer: 59 | Admitting: Neurology

## 2016-04-26 ENCOUNTER — Encounter: Payer: Self-pay | Admitting: Neurology

## 2016-04-26 VITALS — BP 142/92 | HR 76 | Resp 16 | Ht 67.0 in | Wt 184.0 lb

## 2016-04-26 DIAGNOSIS — R6889 Other general symptoms and signs: Secondary | ICD-10-CM

## 2016-04-26 DIAGNOSIS — G4733 Obstructive sleep apnea (adult) (pediatric): Secondary | ICD-10-CM | POA: Diagnosis not present

## 2016-04-26 DIAGNOSIS — R471 Dysarthria and anarthria: Secondary | ICD-10-CM

## 2016-04-26 DIAGNOSIS — Z9989 Dependence on other enabling machines and devices: Secondary | ICD-10-CM

## 2016-04-26 DIAGNOSIS — R4789 Other speech disturbances: Secondary | ICD-10-CM | POA: Diagnosis not present

## 2016-04-26 NOTE — Patient Instructions (Addendum)
We will increase your CPAP pressure to 10 mg.   We will do an EEG (brainwave test), which we will schedule. We will call you with the results.  Try not to stress out!   Stay well hydrated.

## 2016-04-26 NOTE — Progress Notes (Signed)
Subjective:    [name] is a 55 y.o. female.  HPI     HPI:   Dear Dr. Philip Aspen,   I saw your patient, Natalie Le, upon your kind request for evaluation of her aphasia, speech difficulty and abnormal facial sensation. The patient is unaccompanied today. As you know, Natalie Le is a 55 year old right-handed woman with an underlying medical history of ADD, hiatal hernia, irritable bowel syndrome, migraine headaches, chronic low back pain, insomnia, deafness R ear, migraine headaches, and hearing loss, and bipolar disorder, who I have previously seen for her obstructive sleep apnea, treated with CPAP therapy. I last saw her on 11/16/2014, at which time she was compliant with CPAP therapy. She was being followed at [facility]. She reported some cold and congestion symptoms which made it difficult for her to be fully compliant with CPAP therapy at times. Overall, she was sleeping fairly well with it.   Today, 04/26/2016: She reports getting easily jumbled with her words and confused, sometimes disoriented, no seizures as in convulsions. I reviewed her CPAP compliance data from 03/26/2016 through 04/24/2016 which is a total of 30 days during which time she used her machine 27 days with percent used days greater than 4 hours at 70%, indicating adequate compliance with an average usage of 5 hours and 57 minutes, residual AHI suboptimal at 12 per hour, obstructive apnea index appears to be 5 per hour, central apnea index appears to be 2.6 per hour, pressure at 9 cm. Leak at times high with the 95th percentile at 29.8 L/m.   She had neck surgery under Dr. Sherwood Gambler in 1/17 and has been doing okay, healed okay, back to work. Has short term memory issues, and has a FHx of AD in mother, who lived to be 52, MA had PD, who passed away at 43, MGM had brain aneurysms, lived to be 82.   I reviewed your office note from 04/19/16, which you kindly included. She reported intermittent  aphasia, confusion, an episode of facial droop in the past week. She had had more stressors, she and her husband separated this year. She reported a recent tick bite. You treated her with doxycycline 100 mg twice daily for 10 days. You also ordered vitamin D level and B12 level at the time, also TSH, free T4, CBC and BMP. I reviewed lab test results from your office, vitamin B-12 was 531, BMP unremarkable, TSH normal, free T4 normal, CBC unremarkable.   Of note, in the interim, she was admitted to the hospital on 04/04/2015 for concern for TIA, including speech difficulty, and left lower extremity weakness. Workup included head CT without contrast, brain MRI, and echocardiogram. Workup was negative for stroke. No additional testing or stroke follow-up was recommended at the time. She did have a neurological consultation during her stay. She was discharged on 04/05/2015.  She had a more recent brain MRI with and without contrast on 04/19/2016, secondary to speech impairment and confusion:  IMPRESSION: 1. Single prominent subcortical T2 hyperintensity in the right frontal operculum. The finding is nonspecific but can be seen in the setting of chronic microvascular ischemia, a demyelinating process such as multiple sclerosis, vasculitis, complicated migraine headaches, or as the sequelae of a prior infectious or inflammatory process. 2. Otherwise normal MRI of the brain for age.  In addition, I personally reviewed the images through the PACS system.    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 She was still struggling with sleep maintenance and sleep onset issues. I  suggested she try melatonin, 3-5 mg.   I reviewed her compliance data from 10/11/2014 through 11/09/2014 which is a total of 30 days during which time she used her machine 26 days. Percent used days greater than 4 hours was 73%, indicating adequate compliance, average usage of 5 hours and 35 minutes. Residual AHI at 2.8 per hour and leak was acceptable. Pressure at 9 cm with EPR of 1.  I first met her on 03/17/2014 at the request of her primary care physician, at which time she reported memory problems particularly with short-term memory issues and lapses in memory in the context of poor quality sleep. We talked about her prior sleep study from 2009 reviewed the test results. She had at the time no significant obstructive sleep apnea. She also had a sleep study when she was in [place] some 11 or 12 years ago which per her report had shown some sleep apnea. She reported weight gain after her hysterectomy 4 years ago. She reported frequent morning headaches, nonrestorative sleep and difficulty with sleep maintenance as well as excessive daytime somnolence. I invited her back for sleep study.   She had a split-night sleep study on 03/18/2014. Baseline sleep efficiency was 86.2% with a latency to sleep of 6.5 minutes and wake after sleep onset of 14.5 minutes with moderate to severe sleep fragmentation noted. She had a elevated arousal index. She had increased percentages of stage I and stage II sleep, a normal percentage of slow-wave sleep, and absence of REM sleep. She had no significant EKG changes. She had no significant periodic leg movements of sleep. She had mild to moderate and rare loud snoring. She had a total AHI of 52 per hour. Baseline oxygen saturation was 95%, nadir was 89%. She was therefore titrated on CPAP from 5-9 cm. Sleep efficiency during the second portion of the study was increased at 93.1%. Arousal index was normal. She had an increased percentage of stage I and stage II sleep, a very small  percentage of slow-wave sleep and 17.5% of REM sleep. Average oxygen saturation was 96%, nadir was 93%. She felt that she had slept about the same as usual but did not wake up with a headache which was better. I reviewed her CPAP compliance data from 04/01/2014 through 04/30/2014 which is 30 days during which time she uses CPAP every night except for one night. Percent used days greater than 4 hours was 77%, indicating good compliance. Residual AHI was 2.9 per hour indicating inadequate pressure of 9 cm with EPR of 1. Average usage for all night sweats 5 hours and 21 minutes. Leak was low with the 95th percentile at 13.5 L per minute.  I reviewed her compliance data from 03/31/2014 through 05/13/2014 which is a total of 44 days during which time she used CPAP every night except for 2 nights. Percent used days greater than 4 hours was 70%, indicating adequate compliance. Average usage for all nights was 5 hours and 3 minutes. Residual AHI acceptable at 2.6 per hour on a pressure of 9 cm with EPR of 1, leak was low with 13.4 L per minute at the 95th percentile.  Her  Past Medical History Is Significant For: Past Medical History  Diagnosis Date  . Allergy     takes Allegra D daily prn allergies and Flonase prn allergies  . Hiatal hernia   . Hearing impaired     deaf in right ear  . Anxiety     takes Xanax prn anxiety  . Insomnia     takes Trazodone nightly prn sleep  . Bipolar 2 disorder ([place])     takes Adderall daily  . Depression     takes Prozac daily  . Muscle spasms of neck     takes Flexeril daily prn muscle spasms  . PONV (postoperative nausea and vomiting)   . Viral infection     09/25/13-was given an inhaler d/t SOB;was on ZPak  . Shortness of breath     with exertion/sitting  . Pleurisy     when in college  . Pneumonia     walking pneumonia in college  . History of migraine     last one on 10/09/13  . Dizziness   . Weakness     both hands  . Urinary frequency   . Laryngitis    . OSA on CPAP 05/14/2014    Her Past Surgical History Is Significant For: Past Surgical History  Procedure Laterality Date  . Colonoscopy    . Abdominal hysterectomy    . Bunionectomy    . Fx nose repair    . Elbow fracture surgery Right   . Abdominal hernia repair    . Bladder  stretch    . Upper gastrointestinal endoscopy    . Tumor removed from left breast  65yr ago  . Cyst removed from left arm       as a child    Her Family History Is Significant For: Family History  Problem Relation Age of Onset  . Colon cancer Maternal Grandfather   . Hypertension Maternal Grandfather   . Hyperlipidemia Maternal Grandfather   . Diabetes Maternal Grandfather   . Alzheimer's disease Mother   . Hypertension Mother   . Diabetes Mother   . Hypertension Father   . Heart Problems Father     quadruple bypass  . Hyperlipidemia Father   . Diabetes Sister   . Hypertension Sister   . Hyperlipidemia Sister   . Hyperlipidemia Brother   . Hypertension Brother   . Anuerysm Maternal Grandmother   . Diabetes Paternal Grandmother   . Hypertension Paternal Grandmother   . Hyperlipidemia Paternal Grandmother   . Lung cancer Paternal Grandfather     Her Social History Is Significant For: Social History   Social History  . Marital Status: Single    Spouse Name: N/A  . Number of Children: 0  . Years of Education: college   Occupational History  .      Sales   Social History Main Topics  . Smoking status: Never Smoker   . Smokeless tobacco: Never Used  . Alcohol Use: 0.6 oz/week    1 Glasses of wine per week     Comment: rarely  . Drug Use: No  . Sexual Activity: Yes    Birth Control/ Protection: Surgical   Other Topics Concern  . None   Social History Narrative   Patient lives at home with her boyfriend.   Patient works full time sPress photographer   Education college degree   Right handed   Caffeine one cup of coffee daily sometimes tea.    Her Allergies Are:  Allergies  Allergen Reactions  . Demerol Nausea And Vomiting  . Topamax [Topiramate] Other (See Comments)    Fluctuating blood pressure, increased heart rate  . Lamictal [Lamotrigine] Rash  :   Her Current Medications Are:  Outpatient Encounter Prescriptions as of 04/26/2016  Medication Sig  . ALPRAZolam (XANAX) 0.5 MG tablet Take 0.5-0.75 mg by mouth 2 (two) times daily as needed for anxiety or sleep (panic attacks).   Marland Kitchen amphetamine-dextroamphetamine (ADDERALL) 20 MG tablet Take 20 mg by mouth daily.   Marland Kitchen aspirin-acetaminophen-caffeine (EXCEDRIN MIGRAINE) 250-250-65 MG per tablet Take 1-2 tablets by mouth daily as needed for headache or migraine.  . cyclobenzaprine (FLEXERIL) 10 MG tablet TAKE 1/2 -1 TABLET BY ORAL ROUTE 3 TIMES EVERY DAY AS NEEDED FOR MUSCLE SPASMS  . doxycycline (VIBRA-TABS) 100 MG tablet TAKE 1 TABLET TWICE A DAY FOR 10 DAYS  . estazolam (PROSOM) 2 MG tablet Take 2 mg by mouth at bedtime.   Marland Kitchen Fexofenadine-Pseudoephedrine (ALLEGRA-D 24 HOUR PO) Take 1 tablet by mouth daily as needed (allergies).   Marland Kitchen FLUoxetine (PROZAC) 20 MG capsule Take 20 mg by mouth daily.   . fluticasone (FLONASE) 50 MCG/ACT nasal spray Place 2 sprays into both nostrils daily as needed for allergies.   . traZODone (DESYREL) 100 MG tablet Take 100 mg by mouth at bedtime as needed for sleep.   No facility-administered encounter medications on file as of 04/26/2016.  :  Review of Systems:  Out of a complete 14 point review of systems, all are reviewed and negative with the exception of these symptoms as listed below:   Review of Systems  Neurological:       Patient reports that she is using CPAP but she still feels fatigue. Wonders if pressure needs to be changed?  Patient reports that she had recent episodes of mixing up words, confusion, or not finishing sentences. PCP recommended patient be evaluated at ED. R/O stroke. Patient asks if it could be fatigue related? Hx of Alzheimer's and Parkinson's in family.     Objective:  Neurologic Exam  Physical Exam Physical Examination:   Filed Vitals:   04/26/16 1546  BP: 142/92  Pulse: 76  Resp: 16    General Examination: The patient is a very pleasant 55 y.o. female in no acute distress. She appears well-developed and well-nourished and well groomed. She is in good spirits today. Speech a little pressured today.  HEENT: Normocephalic, atraumatic, pupils are equal, round and reactive to light and accommodation. Funduscopic exam is normal with sharp disc margins noted. Extraocular tracking is good without limitation to gaze excursion or nystagmus noted. Normal smooth pursuit is noted. Hearing is quite impaired on both sides, R more than L. Face is symmetric with normal facial animation and normal facial sensation. Speech is clear with no dysarthria noted. There is no hypophonia. There is no lip, neck/head, jaw or voice tremor. Neck is supple with full range of passive and active motion. There are no carotid bruits on auscultation. Oropharynx exam reveals: mild mouth dryness, adequate dental hygiene and moderate airway crowding, due to narrow airway and tonsils still in place. Mallampati is class II. Tongue protrudes centrally and palate elevates symmetrically. Tonsils are 1+ in size.   Chest: Clear to auscultation without wheezing, rhonchi or crackles noted.  Heart: S1+S2+0, regular and normal without murmurs, rubs or gallops noted.   Abdomen: Soft, non-tender and non-distended with normal bowel sounds appreciated on auscultation.  Extremities: There is no pitting edema in the distal lower extremities bilaterally.  Pedal pulses are intact.  Skin: Warm and dry without trophic changes noted. There are no varicose veins.  Musculoskeletal: exam reveals no obvious joint deformities, tenderness or joint swelling or erythema.   Neurologically:  Mental status: The patient is awake, alert and oriented in all 4 spheres. Her immediate and remote memory,  attention, language skills and fund of knowledge are fairly appropriate, but she has a tendency to have pressured speech tries to speak too fast.    Her MMSE on 03/17/14: 30/30, AFT 24/minutes, CDT 4/4.   There is no evidence of aphasia, agnosia, apraxia or anomia. Speech is clear with normal prosody and enunciation, except for loud speech. Thought process is linear. Mood is normal and affect is normal.  Cranial nerves II - XII are as described above under HEENT exam. In addition: shoulder shrug is normal with equal shoulder height noted. Motor exam: Normal bulk, strength and tone is noted. There is no drift, tremor or rebound. Romberg is negative. Reflexes are 2+ throughout. Toes down. Fine motor skills and coordination: intact with normal finger taps, normal hand movements, normal rapid alternating patting, normal foot taps and normal foot agility.  Cerebellar testing: No dysmetria or intention tremor on finger to nose testing. There is no truncal or gait ataxia.  Sensory exam: intact to light touch, pinprick, vibration, temperature sense in the upper and lower extremities.  Gait, station and balance: She stands easily. No veering to one side is noted. No leaning to one side is noted. Posture is age-appropriate and stance is narrow based. Gait shows normal stride length and normal pace. No problems turning are noted. She turns en bloc. Tandem walk is unremarkable.   Assessment and Plan:   In summary, Natalie Le is a very pleasant 55 year old female with an underlying medical history of ADD, hiatal hernia, irritable bowel syndrome, migraine headaches, chronic low back pain, insomnia, deafness R ear, migraine headaches, memory loss and overweight state, recent tick bite and treatment for suspected Lyme d/s, recent separation from husband, recent neck surgery with Dr. Sherwood Gambler in January 2017, who presents for a new consultation of episodic garbled speech and confusion, word finding difficulties,  lasting minutes at a time. She has been on CPAP for OSA and generally compliant with treatment but AHI is suboptimal currently at a pressure of 9 cm. She worries about AD, as her mother had dementia. She had full TIA w/u in 2016 and a repeat recent brain MRI, which showed stable findings. I reviewed test results with her and I explained to her CPAP compliance data to her. Physical exam and neurological exam are nonfocal today, she again is noted to have fast and pressured speech at times but no dysphasia, no dysarthria. I suggested we increase her CPAP pressure to 10 cm and I will request a 30 day download. Furthermore, I suggested we proceed with an EEG due to the episodic nature of her complaints. We will call her with her EEG results. She is encouraged to stay well hydrated, well rested, of course also advised to try to reduce his stressors, mood wise she says she feels stable. I would like to see her back routinely in 6 month, sooner if needed. I answered all her questions today and she was in agreement.

## 2016-05-23 ENCOUNTER — Other Ambulatory Visit: Payer: 59

## 2016-09-25 ENCOUNTER — Encounter: Payer: Self-pay | Admitting: Neurology

## 2016-10-25 ENCOUNTER — Ambulatory Visit: Payer: 59 | Admitting: Neurology

## 2016-11-09 ENCOUNTER — Emergency Department (HOSPITAL_COMMUNITY): Payer: 59

## 2016-11-09 ENCOUNTER — Emergency Department (HOSPITAL_COMMUNITY)
Admission: EM | Admit: 2016-11-09 | Discharge: 2016-11-09 | Disposition: A | Discharge status: Home / Self Care | Payer: 59 | Attending: Emergency Medicine | Admitting: Emergency Medicine

## 2016-11-09 ENCOUNTER — Encounter (HOSPITAL_COMMUNITY): Payer: Self-pay | Admitting: Emergency Medicine

## 2016-11-09 DIAGNOSIS — Z7982 Long term (current) use of aspirin: Secondary | ICD-10-CM | POA: Diagnosis not present

## 2016-11-09 DIAGNOSIS — Y929 Unspecified place or not applicable: Secondary | ICD-10-CM | POA: Diagnosis not present

## 2016-11-09 DIAGNOSIS — T189XXA Foreign body of alimentary tract, part unspecified, initial encounter: Secondary | ICD-10-CM | POA: Diagnosis present

## 2016-11-09 DIAGNOSIS — T182XXA Foreign body in stomach, initial encounter: Secondary | ICD-10-CM | POA: Insufficient documentation

## 2016-11-09 DIAGNOSIS — Z8673 Personal history of transient ischemic attack (TIA), and cerebral infarction without residual deficits: Secondary | ICD-10-CM | POA: Diagnosis not present

## 2016-11-09 DIAGNOSIS — Y999 Unspecified external cause status: Secondary | ICD-10-CM | POA: Diagnosis not present

## 2016-11-09 DIAGNOSIS — X58XXXA Exposure to other specified factors, initial encounter: Secondary | ICD-10-CM | POA: Insufficient documentation

## 2016-11-09 DIAGNOSIS — Y939 Activity, unspecified: Secondary | ICD-10-CM | POA: Insufficient documentation

## 2016-11-09 MED ORDER — LIDOCAINE VISCOUS 2 % MT SOLN
15.0000 mL | Freq: Once | OROMUCOSAL | Status: AC
  Administered 2016-11-09: 15 mL via OROMUCOSAL
  Filled 2016-11-09: qty 15

## 2016-11-09 NOTE — ED Triage Notes (Signed)
Patient from home stating that she accidentally swallowed a hearing aid battery. Denies abd pain.

## 2016-11-09 NOTE — ED Notes (Signed)
Bed: [id number] Expected date:  Expected time:  Means of arrival:  Comments: EMS- swallowed hearing aid battery

## 2016-11-09 NOTE — ED Provider Notes (Signed)
[facility] DEPT Provider Note   CSN: [id number] Arrival date & time: 11/09/16  1534     History   Chief Complaint No chief complaint on file.   HPI Natalie Le is a 55 y.o. female.  88-year-old Caucasian female past medical history severe for anxiety, ADHD, bipolar disorder presents to the ED today after swallowing a hearing aid battery approx 1 hour pta. Patient states that she was getting ready to take her medications and have gotten a hearing aid battery out of a cabinet. She had both in her hand and states her "ADHD" made her not think about it and she swallowed the pill and the battery that was in her hand. She states she feel like it is in her throat. She is able to swallow and manager her secretions. Denies any sob, cp, abd pain, n/v. Patient has not tried to eat or drink anything after the ingestion the battery. She denies any other complaints at this time.       Past Medical History:  Diagnosis Date  . Allergy    takes Allegra D daily prn allergies and Flonase prn allergies  . Anxiety    takes Xanax prn anxiety  . Bipolar 2 disorder ([place])    takes Adderall daily  . Depression    takes Prozac daily  . Dizziness   . Hearing impaired    deaf in right ear  . Hiatal hernia   . History of migraine    last one on 10/09/13  . Insomnia    takes Trazodone nightly prn sleep  . Laryngitis   . Muscle spasms of neck    takes Flexeril daily prn muscle spasms  . OSA on CPAP 05/14/2014  . Pleurisy    when in college  . Pneumonia    walking pneumonia in college  . PONV (postoperative nausea and vomiting)   . Shortness of breath    with exertion/sitting  . Urinary frequency   . Viral infection    09/25/13-was given an inhaler d/t SOB;was on ZPak  . Weakness    both hands    Patient Active Problem List   Diagnosis Date Noted  . Bipolar disorder ([place]) 04/05/2015  . TIA (transient ischemic attack) 04/04/2015  . Aphasia 04/04/2015  . OSA on CPAP 05/14/2014     Past Surgical History:  Procedure Laterality Date  . ABDOMINAL HERNIA REPAIR    . ABDOMINAL HYSTERECTOMY    . bladder  stretch    . BUNIONECTOMY    . COLONOSCOPY    . cyst removed from left arm      as a child  . ELBOW FRACTURE SURGERY Right   . fx nose repair    . tumor removed from left breast  57yrs ago  . UPPER GASTROINTESTINAL ENDOSCOPY      OB History    No data available       Home Medications    Prior to Admission medications   Medication Sig Start Date End Date Taking? Authorizing Provider  ALPRAZolam Duanne Moron) 0.5 MG tablet Take 0.5-0.75 mg by mouth 2 (two) times daily as needed for anxiety or sleep (panic attacks).  08/26/14   Historical Provider, MD  amphetamine-dextroamphetamine (ADDERALL) 20 MG tablet Take 20 mg by mouth daily.     Historical Provider, MD  aspirin-acetaminophen-caffeine (EXCEDRIN MIGRAINE) [phone] MG per tablet Take 1-2 tablets by mouth daily as needed for headache or migraine.    Historical Provider, MD  cyclobenzaprine (FLEXERIL) 10 MG tablet TAKE  1/2 -1 TABLET BY ORAL ROUTE 3 TIMES EVERY DAY AS NEEDED FOR MUSCLE SPASMS 02/23/16   Historical Provider, MD  doxycycline (VIBRA-TABS) 100 MG tablet TAKE 1 TABLET TWICE A DAY FOR 10 DAYS 04/19/16   Historical Provider, MD  estazolam (PROSOM) 2 MG tablet Take 2 mg by mouth at bedtime.  04/02/15   Historical Provider, MD  Fexofenadine-Pseudoephedrine (ALLEGRA-D 24 HOUR PO) Take 1 tablet by mouth daily as needed (allergies).     Historical Provider, MD  FLUoxetine (PROZAC) 20 MG capsule Take 20 mg by mouth daily.  04/02/15   Historical Provider, MD  fluticasone (FLONASE) 50 MCG/ACT nasal spray Place 2 sprays into both nostrils daily as needed for allergies.     Historical Provider, MD  traZODone (DESYREL) 100 MG tablet Take 100 mg by mouth at bedtime as needed for sleep.    Historical Provider, MD    Family History Family History  Problem Relation Age of Onset  . Colon cancer Maternal Grandfather   .  Hypertension Maternal Grandfather   . Hyperlipidemia Maternal Grandfather   . Diabetes Maternal Grandfather   . Alzheimer's disease Mother   . Hypertension Mother   . Diabetes Mother   . Hypertension Father   . Heart Problems Father     quadruple bypass  . Hyperlipidemia Father   . Diabetes Sister   . Hypertension Sister   . Hyperlipidemia Sister   . Hyperlipidemia Brother   . Hypertension Brother   . Anuerysm Maternal Grandmother   . Diabetes Paternal Grandmother   . Hypertension Paternal Grandmother   . Hyperlipidemia Paternal Grandmother   . Lung cancer Paternal Grandfather     Social History Social History  Substance Use Topics  . Smoking status: Never Smoker  . Smokeless tobacco: Never Used  . Alcohol use 0.6 oz/week    1 Glasses of wine per week     Comment: rarely     Allergies   Demerol; Topamax [topiramate]; and Lamictal [lamotrigine]   Review of Systems Review of Systems  Constitutional: Negative for chills and fever.  HENT: Negative for congestion, sore throat and trouble swallowing.   Respiratory: Negative for cough and shortness of breath.   Cardiovascular: Negative for chest pain and palpitations.  Gastrointestinal: Negative for abdominal pain, diarrhea, nausea and vomiting.  Genitourinary: Negative.   Musculoskeletal: Negative.   Skin: Negative.   Neurological: Negative for dizziness, syncope, weakness, light-headedness, numbness and headaches.  All other systems reviewed and are negative.    Physical Exam Updated Vital Signs BP 135/82 (BP Location: Right Arm)   Pulse 60   Temp 98.5 F (36.9 C) (Oral)   Resp 18   LMP 12/18/2005   SpO2 99%   Physical Exam  Constitutional: She is oriented to person, place, and time. She appears well-developed and well-nourished. No distress.  Patient speaking in complete sentences and maintaining her airway and secretions.  HENT:  Head: Normocephalic and atraumatic.  Mouth/Throat: Uvula is midline,  oropharynx is clear and moist and mucous membranes are normal.  Unable to visualize any foreign body in the oropharynx.  Eyes: Conjunctivae are normal. Pupils are equal, round, and reactive to light. Right eye exhibits no discharge. Left eye exhibits no discharge. No scleral icterus.  Neck: Normal range of motion. Neck supple. No thyromegaly present.  Cardiovascular: Normal rate, regular rhythm, normal heart sounds and intact distal pulses.  Exam reveals no gallop and no friction rub.   No murmur heard. Pulmonary/Chest: Effort normal and breath sounds normal.  No respiratory distress.  Patient is maintaining airway. She is not hypoxic or tachypneic at this time. Lungs are clear to auscultation bilaterally. No decreased breath sounds noted.  Abdominal: Soft. Bowel sounds are normal. She exhibits no distension. There is no tenderness. There is no rebound and no guarding.  Musculoskeletal: Normal range of motion.  Lymphadenopathy:    She has no cervical adenopathy.  Neurological: She is alert and oriented to person, place, and time.  Skin: Skin is warm and dry. Capillary refill takes less than 2 seconds.  Nursing note and vitals reviewed.    ED Treatments / Results  Labs (all labs ordered are listed, but only abnormal results are displayed) Labs Reviewed - No data to display  EKG  EKG Interpretation None       Radiology Dg Neck Soft Tissue  Result Date: 11/09/2016 CLINICAL DATA:  Swallowed a hearing aid battery today. Foreign body sensation in throat. EXAM: NECK SOFT TISSUES - 1+ VIEW COMPARISON:  01/25/2016 and prior radiographs FINDINGS: No unexpected radiopaque foreign bodies identified. Anterior fusion from C3-C6 noted. Moderate degenerative disc disease at C6-7 noted. There is no evidence of prevertebral soft tissue swelling. Epiglottis and aryepiglottic folds are unremarkable. IMPRESSION: No evidence of unexpected radiopaque foreign body. Electronically Signed   By: Margarette Canada  M.D.   On: 11/09/2016 16:42   Dg Chest 2 View  Result Date: 11/09/2016 CLINICAL DATA:  Swallowed hearing aid battery today. EXAM: CHEST  2 VIEW COMPARISON:  None. FINDINGS: No unexpected radiopaque foreign bodies are identified. The cardiomediastinal silhouette is unremarkable. There is no evidence of focal airspace disease, pulmonary edema, suspicious pulmonary nodule/mass, pleural effusion, or pneumothorax. No acute bony abnormalities are identified. IMPRESSION: No active cardiopulmonary disease. No unexpected radiopaque foreign body. Electronically Signed   By: Margarette Canada M.D.   On: 11/09/2016 16:44   Dg Abdomen 1 View  Result Date: 11/09/2016 CLINICAL DATA:  Swallowed foreign body EXAM: ABDOMEN - 1 VIEW COMPARISON:  09/10/2015 FINDINGS: 1.4 cm oval opacity in the central abdomen. Bowel-gas pattern nonobstructed. No pathologic calcifications. IMPRESSION: 1.4 cm radiopaque foreign body in the central abdomen. Electronically Signed   By: Donavan Foil M.D.   On: 11/09/2016 17:15    Procedures Procedures (including critical care time)  Medications Ordered in ED Medications  lidocaine (XYLOCAINE) 2 % viscous mouth solution 15 mL (not administered)     Initial Impression / Assessment and Plan / ED Course  I have reviewed the triage vital signs and the nursing notes.  Pertinent labs & imaging results that were available during my care of the patient were reviewed by me and considered in my medical decision making (see chart for details).  Clinical Course   Patient presents to ED after actually swallowing a hearing aid battery. X-rays were obtained that showed the battery to be present in the central abdomen it has passed through the esophagus. Patient denies any abdominal pain, nausea, emesis, change in bowel habits, unable to pass gas. I talked to [place] at [facility] who states that patient needs to eat a high fiber diet and watch for passing of battery in her stool no other concerns  at this time. She recommends follow-up x-ray in one week for resolution of passing of battery. Patient denies any other complaints at this time. I have given her strict return precautions including signs of bowel obstruction. Pt is hemodynamically stable, in NAD, & able to maintain her airway and secretions. Pain has been managed & has no  complaints prior to dc. Pt is comfortable with above plan and is stable for discharge at this time. All questions were answered prior to disposition. Strict return precautions for f/u to the ED were discussed.   Final Clinical Impressions(s) / ED Diagnoses   Final diagnoses:  Swallowed foreign body, initial encounter    New Prescriptions New Prescriptions   No medications on file     Doristine Devoid, PA-C 11/09/16 1748    Charlesetta Shanks, MD 11/09/16 [phone]

## 2016-11-09 NOTE — Discharge Instructions (Signed)
Please eat a high fiber diet and plenty of fruit. You need to monitor your stools to determine if you pass the battery. You can follow up with your pcp. May need a repeat xray of your abdomen if you have not passed it in the next week. Return to the ED if you develop worsening abd pain, unable to have a bowel movement, unable to pass gas, nausea or vomiting.

## 2016-12-19 ENCOUNTER — Emergency Department (HOSPITAL_BASED_OUTPATIENT_CLINIC_OR_DEPARTMENT_OTHER)
Admission: EM | Admit: 2016-12-19 | Discharge: 2016-12-19 | Disposition: A | Discharge status: Home / Self Care | Payer: 59 | Attending: Physician Assistant | Admitting: Physician Assistant

## 2016-12-19 ENCOUNTER — Emergency Department (HOSPITAL_BASED_OUTPATIENT_CLINIC_OR_DEPARTMENT_OTHER): Payer: 59

## 2016-12-19 ENCOUNTER — Encounter (HOSPITAL_BASED_OUTPATIENT_CLINIC_OR_DEPARTMENT_OTHER): Payer: Self-pay | Admitting: *Deleted

## 2016-12-19 DIAGNOSIS — R51 Headache: Secondary | ICD-10-CM | POA: Diagnosis not present

## 2016-12-19 DIAGNOSIS — Z7982 Long term (current) use of aspirin: Secondary | ICD-10-CM | POA: Diagnosis not present

## 2016-12-19 DIAGNOSIS — R42 Dizziness and giddiness: Secondary | ICD-10-CM | POA: Diagnosis present

## 2016-12-19 DIAGNOSIS — G43809 Other migraine, not intractable, without status migrainosus: Secondary | ICD-10-CM | POA: Insufficient documentation

## 2016-12-19 DIAGNOSIS — Z5181 Encounter for therapeutic drug level monitoring: Secondary | ICD-10-CM | POA: Insufficient documentation

## 2016-12-19 LAB — COMPREHENSIVE METABOLIC PANEL
ALT: 17 U/L (ref 14–54)
ANION GAP: 7 (ref 5–15)
AST: 17 U/L (ref 15–41)
Albumin: 4 g/dL (ref 3.5–5.0)
Alkaline Phosphatase: 75 U/L (ref 38–126)
BILIRUBIN TOTAL: 0.9 mg/dL (ref 0.3–1.2)
BUN: 15 mg/dL (ref 6–20)
CO2: 26 mmol/L (ref 22–32)
Calcium: 9.4 mg/dL (ref 8.9–10.3)
Chloride: 102 mmol/L (ref 101–111)
Creatinine, Ser: 0.76 mg/dL (ref 0.44–1.00)
GFR calc Af Amer: >60 mL/min (ref >60)
Glucose, Bld: 97 mg/dL (ref 65–99)
POTASSIUM: 3.6 mmol/L (ref 3.5–5.1)
Sodium: 135 mmol/L (ref 135–145)
TOTAL PROTEIN: 7.1 g/dL (ref 6.5–8.1)

## 2016-12-19 LAB — CBG MONITORING, ED: Glucose-Capillary: 99 mg/dL (ref 65–99)

## 2016-12-19 LAB — DIFFERENTIAL
Basophils Absolute: 0 10*3/uL (ref 0.0–0.1)
Basophils Relative: 0 %
EOS ABS: 0 10*3/uL (ref 0.0–0.7)
EOS PCT: 1 %
LYMPHS ABS: 1.8 10*3/uL (ref 0.7–4.0)
Lymphocytes Relative: 24 %
MONOS PCT: 9 %
Monocytes Absolute: 0.7 10*3/uL (ref 0.1–1.0)
Neutro Abs: 4.8 10*3/uL (ref 1.7–7.7)
Neutrophils Relative %: 66 %

## 2016-12-19 LAB — CBC
HEMATOCRIT: 37.9 % (ref 36.0–46.0)
HEMOGLOBIN: 12.3 g/dL (ref 12.0–15.0)
MCH: 29.3 pg (ref 26.0–34.0)
MCHC: 32.5 g/dL (ref 30.0–36.0)
MCV: 90.2 fL (ref 78.0–100.0)
Platelets: 257 10*3/uL (ref 150–400)
RBC: 4.2 MIL/uL (ref 3.87–5.11)
RDW: 13.3 % (ref 11.5–15.5)
WBC: 7.3 10*3/uL (ref 4.0–10.5)

## 2016-12-19 LAB — PROTIME-INR
INR: 0.97
Prothrombin Time: 12.9 seconds (ref 11.4–15.2)

## 2016-12-19 LAB — TROPONIN I

## 2016-12-19 LAB — APTT: aPTT: 29 seconds (ref 24–36)

## 2016-12-19 MED ORDER — SODIUM CHLORIDE 0.9 % IV BOLUS (SEPSIS)
1000.0000 mL | Freq: Once | INTRAVENOUS | Status: AC
  Administered 2016-12-19: 1000 mL via INTRAVENOUS

## 2016-12-19 MED ORDER — DIPHENHYDRAMINE HCL 50 MG/ML IJ SOLN
25.0000 mg | Freq: Once | INTRAMUSCULAR | Status: AC
  Administered 2016-12-19: 25 mg via INTRAVENOUS
  Filled 2016-12-19: qty 1

## 2016-12-19 MED ORDER — PROCHLORPERAZINE EDISYLATE 5 MG/ML IJ SOLN
10.0000 mg | Freq: Once | INTRAMUSCULAR | Status: AC
  Administered 2016-12-19: 10 mg via INTRAVENOUS
  Filled 2016-12-19: qty 2

## 2016-12-19 NOTE — Discharge Instructions (Signed)
Please return with any concerns. If you have any weakness tingling or other concerns please return immmediately.

## 2016-12-19 NOTE — ED Provider Notes (Signed)
[facility] DEPT [facility] Provider Note   CSN: [id number] Arrival date & time: 12/19/16  1538   An emergency department physician performed an initial assessment on this suspected stroke patient at 1628.  History   Chief Complaint Chief Complaint  Patient presents with  . Facial Pain  . Dizziness    HPI Natalie Le is a 56 y.o. female.  HPI   Patient is a 22 her old female presenting with headache. Patient had a acute headache while driving today. She had associated dizziness. She reports that she always has dizziness. Patient has history of migraines. Patient that she was worried that she could be having stroke because females present with atypical stroke symptoms. She has no weakness. No difficulty speaking. She does have trouble swallowing which is baseline for her. ( she's had surgery on her neck and often has subjective trouble swallowing)  Head pain is from the right temporal region down to the right neck. It is mostly resolved at this point.  Patient says she states that she is all right, she exhibits could be anxiety versus migraine. She just  came here " to get checked out."  Past Medical History:  Diagnosis Date  . Allergy    takes Allegra D daily prn allergies and Flonase prn allergies  . Anxiety    takes Xanax prn anxiety  . Bipolar 2 disorder ([place])    takes Adderall daily  . Depression    takes Prozac daily  . Dizziness   . Hearing impaired    deaf in right ear  . Hiatal hernia   . History of migraine    last one on 10/09/13  . Insomnia    takes Trazodone nightly prn sleep  . Laryngitis   . Muscle spasms of neck    takes Flexeril daily prn muscle spasms  . OSA on CPAP 05/14/2014  . Pleurisy    when in college  . Pneumonia    walking pneumonia in college  . PONV (postoperative nausea and vomiting)   . Shortness of breath    with exertion/sitting  . Urinary frequency   . Viral infection    09/25/13-was given an inhaler d/t SOB;was on ZPak  .  Weakness    both hands    Patient Active Problem List   Diagnosis Date Noted  . Bipolar disorder ([place]) 04/05/2015  . TIA (transient ischemic attack) 04/04/2015  . Aphasia 04/04/2015  . OSA on CPAP 05/14/2014    Past Surgical History:  Procedure Laterality Date  . ABDOMINAL HERNIA REPAIR    . ABDOMINAL HYSTERECTOMY    . bladder  stretch    . BUNIONECTOMY    . COLONOSCOPY    . cyst removed from left arm      as a child  . ELBOW FRACTURE SURGERY Right   . fx nose repair    . tumor removed from left breast  92yrs ago  . UPPER GASTROINTESTINAL ENDOSCOPY      OB History    No data available       Home Medications    Prior to Admission medications   Medication Sig Start Date End Date Taking? Authorizing Provider  ALPRAZolam Duanne Moron) 0.5 MG tablet Take 0.5-0.75 mg by mouth 2 (two) times daily as needed for anxiety or sleep (panic attacks).  08/26/14   Historical Provider, MD  amphetamine-dextroamphetamine (ADDERALL) 20 MG tablet Take 20 mg by mouth daily.     Historical Provider, MD  aspirin-acetaminophen-caffeine (EXCEDRIN MIGRAINE) [phone] MG per  tablet Take 1-2 tablets by mouth daily as needed for headache or migraine.    Historical Provider, MD  cyclobenzaprine (FLEXERIL) 10 MG tablet TAKE 1/2 -1 TABLET BY ORAL ROUTE 3 TIMES EVERY DAY AS NEEDED FOR MUSCLE SPASMS 02/23/16   Historical Provider, MD  doxycycline (VIBRA-TABS) 100 MG tablet TAKE 1 TABLET TWICE A DAY FOR 10 DAYS 04/19/16   Historical Provider, MD  estazolam (PROSOM) 2 MG tablet Take 2 mg by mouth at bedtime.  04/02/15   Historical Provider, MD  Fexofenadine-Pseudoephedrine (ALLEGRA-D 24 HOUR PO) Take 1 tablet by mouth daily as needed (allergies).     Historical Provider, MD  FLUoxetine (PROZAC) 20 MG capsule Take 20 mg by mouth daily.  04/02/15   Historical Provider, MD  fluticasone (FLONASE) 50 MCG/ACT nasal spray Place 2 sprays into both nostrils daily as needed for allergies.     Historical Provider, MD  traZODone  (DESYREL) 100 MG tablet Take 100 mg by mouth at bedtime as needed for sleep.    Historical Provider, MD    Family History Family History  Problem Relation Age of Onset  . Colon cancer Maternal Grandfather   . Hypertension Maternal Grandfather   . Hyperlipidemia Maternal Grandfather   . Diabetes Maternal Grandfather   . Alzheimer's disease Mother   . Hypertension Mother   . Diabetes Mother   . Hypertension Father   . Heart Problems Father     quadruple bypass  . Hyperlipidemia Father   . Diabetes Sister   . Hypertension Sister   . Hyperlipidemia Sister   . Hyperlipidemia Brother   . Hypertension Brother   . Anuerysm Maternal Grandmother   . Diabetes Paternal Grandmother   . Hypertension Paternal Grandmother   . Hyperlipidemia Paternal Grandmother   . Lung cancer Paternal Grandfather     Social History Social History  Substance Use Topics  . Smoking status: Never Smoker  . Smokeless tobacco: Never Used  . Alcohol use 0.6 oz/week    1 Glasses of wine per week     Comment: rarely     Allergies   Demerol; Topamax [topiramate]; and Lamictal [lamotrigine]   Review of Systems Review of Systems  Constitutional: Positive for fatigue. Negative for fever.  Respiratory: Negative for chest tightness and shortness of breath.   Neurological: Positive for dizziness, light-headedness and headaches. Negative for seizures, syncope, facial asymmetry, speech difficulty, weakness and numbness.  Psychiatric/Behavioral: Positive for confusion.  All other systems reviewed and are negative.    Physical Exam Updated Vital Signs BP 121/79   Pulse (!) 59   Temp 97.8 F (36.6 C) (Oral)   Resp 10   Ht 5\' 6"  (1.676 m)   Wt 162 lb (73.5 kg)   LMP 12/18/2005   SpO2 100%   BMI 26.15 kg/m   Physical Exam  Constitutional: She is oriented to person, place, and time. She appears well-developed and well-nourished.  HENT:  Head: Normocephalic and atraumatic.  Mouth/Throat: No  oropharyngeal exudate.  Eyes: EOM are normal. Pupils are equal, round, and reactive to light. Right eye exhibits no discharge. Left eye exhibits no discharge.  Cardiovascular: Normal rate, regular rhythm and normal heart sounds.   No murmur heard. Pulmonary/Chest: Effort normal and breath sounds normal. She has no wheezes. She has no rales.  Abdominal: Soft. She exhibits no distension. There is no tenderness.  Neurological: She is oriented to person, place, and time.  Very odd affect.  Equal strength bilaterally upper and lower extremities negative pronator drift.  Normal sensation bilaterally. Speech comprehensible, no slurring. Facial nerve tested and appears grossly normal. Alert and oriented 3.   Skin: Skin is warm and dry. She is not diaphoretic.  Psychiatric: She has a normal mood and affect. Her behavior is normal.  Nursing note and vitals reviewed.    ED Treatments / Results  Labs (all labs ordered are listed, but only abnormal results are displayed) Labs Reviewed  PROTIME-INR  APTT  CBC  DIFFERENTIAL  COMPREHENSIVE METABOLIC PANEL  TROPONIN I  CBG MONITORING, ED    EKG  EKG Interpretation None       Radiology Ct Head Code Stroke W/o Cm  Result Date: 12/19/2016 CLINICAL DATA:  Code stroke. Initial evaluation for acute dizziness, headache. EXAM: CT HEAD WITHOUT CONTRAST TECHNIQUE: Contiguous axial images were obtained from the base of the skull through the vertex without intravenous contrast. COMPARISON:  Prior study from 04/19/2016. FINDINGS: Brain: Cerebral volume within normal limits. Gray-white matter differentiation well maintained. No acute intracranial hemorrhage. No evidence for acute large vessel territory infarct. No mass lesion, midline shift, or mass effect. No hydrocephalus. No extra-axial fluid collection. Vascular: No hyperdense vessel. Skull: Scalp soft tissues within normal limits. Calvarium intact. Metallic implant in place at the right occipital  calvarium. Sinuses/Orbits: The globes and orbital soft tissues within normal limits. Paranasal sinuses are clear. No mastoid effusion. ASPECTS [facility] Stroke Program Early CT Score) - Ganglionic level infarction (caudate, lentiform nuclei, internal capsule, insula, M1-M3 cortex): 7 - Supraganglionic infarction (M4-M6 cortex): 3 Total score (0-10 with 10 being normal): 10 IMPRESSION: 1. No acute intracranial process identified. 2. ASPECTS is 10 Critical Value/emergent results were called by telephone at the time of interpretation on 12/19/2016 at 4:38 pm to Dr. Gerald Leitz , who verbally acknowledged these results. Electronically Signed   By: Jeannine Boga M.D.   On: 12/19/2016 16:44    Procedures Procedures (including critical care time)  Medications Ordered in ED Medications  prochlorperazine (COMPAZINE) injection 10 mg (not administered)  diphenhydrAMINE (BENADRYL) injection 25 mg (not administered)  sodium chloride 0.9 % bolus 1,000 mL (not administered)     Initial Impression / Assessment and Plan / ED Course  I have reviewed the triage vital signs and the nursing notes.  Pertinent labs & imaging results that were available during my care of the patient were reviewed by me and considered in my medical decision making (see chart for details).  Clinical Course    This 14 her old female presenting with headache. Patient has history of migraine. Patient said that she is concerned about a stroke. She has normal neurologic exam on arrival here. Patient's CT normal. I do not suspect stroke in this female given her presenting symptoms and current physical exam. Since stroke does not usually present with headache. Patient additionally has no neurologic symptoms moment. Patient still has headache. We'll treat for migraine.  5:57 PM Feels improved will havefollow up with PCP.  Patient is comfortable, ambulatory, and taking PO at time of discharge.  Patient expressed understanding about  return precautions.    Final Clinical Impressions(s) / ED Diagnoses   Final diagnoses:  None    New Prescriptions New Prescriptions   No medications on file     Laquana Villari Julio Alm, MD 12/19/16 1757

## 2016-12-19 NOTE — ED Notes (Signed)
IV removed Rt arm, site clear, dry, catheter intacted

## 2017-02-19 DIAGNOSIS — H903 Sensorineural hearing loss, bilateral: Secondary | ICD-10-CM | POA: Diagnosis not present

## 2017-02-26 DIAGNOSIS — Z0000 Encounter for general adult medical examination without abnormal findings: Secondary | ICD-10-CM | POA: Diagnosis not present

## 2017-03-05 DIAGNOSIS — Z1389 Encounter for screening for other disorder: Secondary | ICD-10-CM | POA: Diagnosis not present

## 2017-03-05 DIAGNOSIS — G4733 Obstructive sleep apnea (adult) (pediatric): Secondary | ICD-10-CM | POA: Diagnosis not present

## 2017-03-05 DIAGNOSIS — R7309 Other abnormal glucose: Secondary | ICD-10-CM | POA: Diagnosis not present

## 2017-03-05 DIAGNOSIS — Z0000 Encounter for general adult medical examination without abnormal findings: Secondary | ICD-10-CM | POA: Diagnosis not present

## 2017-03-05 DIAGNOSIS — R739 Hyperglycemia, unspecified: Secondary | ICD-10-CM | POA: Insufficient documentation

## 2017-03-05 DIAGNOSIS — M5030 Other cervical disc degeneration, unspecified cervical region: Secondary | ICD-10-CM | POA: Diagnosis not present

## 2017-03-26 ENCOUNTER — Ambulatory Visit (INDEPENDENT_AMBULATORY_CARE_PROVIDER_SITE_OTHER): Payer: 59 | Admitting: Neurology

## 2017-03-26 ENCOUNTER — Encounter (INDEPENDENT_AMBULATORY_CARE_PROVIDER_SITE_OTHER): Payer: Self-pay

## 2017-03-26 ENCOUNTER — Encounter: Payer: Self-pay | Admitting: Neurology

## 2017-03-26 VITALS — BP 146/80 | HR 76 | Resp 20 | Ht 67.0 in | Wt 166.0 lb

## 2017-03-26 DIAGNOSIS — Z789 Other specified health status: Secondary | ICD-10-CM | POA: Diagnosis not present

## 2017-03-26 DIAGNOSIS — G4733 Obstructive sleep apnea (adult) (pediatric): Secondary | ICD-10-CM

## 2017-03-26 DIAGNOSIS — R634 Abnormal weight loss: Secondary | ICD-10-CM

## 2017-03-26 NOTE — Patient Instructions (Signed)
We will try to re-evaluate your sleep apnea.  Maybe you are a candidate for an oral appliance.

## 2017-03-26 NOTE — Progress Notes (Signed)
Subjective:    [name] is a 56 y.o. female.  HPI     Interim history:   Natalie Le is a 56 year old right-handed woman with an underlying medical history of ADD, hiatal hernia, irritable bowel syndrome, migraine headaches, chronic low back pain, insomnia, deafness R ear, migraine headaches, and hearing loss, and bipolar disorder, who presents for follow-up consultation of her OSA, episode of word finding difficulty. I last saw her on 04/26/2016 at which time she reported confusion and episodic word finding difficulty. I suggested we proceed with an EEG. She did not have it done. She was adequately compliant with her CPAP at the time. She had recent neck surgery under Dr. Sherwood Gambler in January 2017.   Today, 03/26/2017 (all dictated new, as well as above notes, some dictation done in note pad or Word, outside of chart, may appear as copied):   I reviewed her CPAP compliance data from 02/20/2017 through 03/21/2017, which is a total of 30 days, during which time she used her machine 20 days with percent used days greater than 4 hours at 50%, indicating suboptimal compliance with an average usage of 6 hours and 6 minutes, residual AHI 7.3 per hour, leak on the high side for the 95th percentile at 42.9 L/m on a pressure of 10 cm with EPR of 1. She reports that she does not like to use her CPAP. Of note, she has lost weight. At the time of her sleep study she weighed almost 30 pounds more. 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 She travels for work, but took it on a cruise even, but finds it hard to use, FFM causes dry mouth. She has been struggling with it for a long time. She decided not to pursue with EEG. She feels stable otherwise. She has been able to lose weight through [employer]. She would like to lose another 15 pounds. She feels stable with regards to his speech and word finding difficulties, she believes that she talks too fast and tries to multitask. She feels tired all the  time. 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  The patient's allergies, current medications, family history, past medical history, past social history, past surgical history and problem list were reviewed and updated as appropriate.   Previously (copied from previous notes for reference):   04/26/2016: She reports getting easily jumbled with her words and confused, sometimes disoriented, no seizures as in convulsions. I reviewed her CPAP compliance data from 03/26/2016 through 04/24/2016 which is a total of 30 days during which time she used her machine 27 days with percent used days greater than 4 hours at 70%, indicating adequate compliance with an average usage of 5 hours and 57 minutes, residual AHI suboptimal at 12 per hour, obstructive apnea index appears to be 5 per hour, central apnea index appears to be 2.6 per hour, pressure at 9 cm. Leak at times high with the 95th percentile at 29.8 L/m.    She had neck surgery under Dr. Sherwood Gambler in 1/17 and has been doing okay, healed okay, back to work. Has short term memory issues, and has a FHx of AD in mother, who lived to be 36, MA had PD, who passed away at 62, MGM had brain aneurysms, lived to be 71.    I reviewed your office note from 04/19/16, which you kindly included. She reported intermittent aphasia, confusion, an episode of facial droop in the past week.  She had had more stressors, she and her husband separated this year. She reported a recent tick bite. You treated her with doxycycline 100 mg twice daily for 10 days. You also ordered vitamin D level and B12 level at the time, also TSH, free T4, CBC and BMP. I reviewed lab test results from your office, vitamin B-12 was 531, BMP unremarkable, TSH normal, free T4 normal, CBC unremarkable.    Of note, in the interim, she was admitted to the hospital on 04/04/2015 for concern for TIA, including speech difficulty, and left lower extremity weakness. Workup  included head CT without contrast, brain MRI, and echocardiogram. Workup was negative for stroke. No additional testing or stroke follow-up was recommended at the time. She did have a neurological consultation during her stay. She was discharged on 04/05/2015.   She had a more recent brain MRI with and without contrast on 04/19/2016, secondary to speech impairment and confusion:  IMPRESSION: 1. Single prominent subcortical T2 hyperintensity in the right frontal operculum. The finding is nonspecific but can be seen in the setting of chronic microvascular ischemia, a demyelinating process such as multiple sclerosis, vasculitis, complicated migraine headaches, or as the sequelae of a prior infectious or inflammatory process. 2. Otherwise normal MRI of the brain for age.   In addition, I personally reviewed the images through the PACS system.    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 She was being followed at [facility]. She reported some cold and congestion symptoms which made it difficult for her to be fully compliant with CPAP therapy at times. Overall, she was sleeping fairly well with it.    I saw her on 05/14/2014, at which time she reported sleeping better, and she felt her memory was better. She was still struggling with sleep maintenance and sleep onset issues. I suggested she try melatonin, 3-5 mg.    I reviewed her compliance data from 10/11/2014 through 11/09/2014 which is a total of 30 days during which time she used her machine 26 days. Percent used days greater than 4 hours was 73%, indicating adequate compliance, average usage of 5 hours and 35 minutes. Residual AHI at 2.8 per hour  and leak was acceptable. Pressure at 9 cm with EPR of 1.   I first met her on 03/17/2014 at the request of her primary care physician, at which time she reported memory problems particularly with short-term memory issues and lapses in memory in the context of poor quality sleep. We talked about her prior sleep study from 2009 reviewed the test results. She had at the time no significant obstructive sleep apnea. She also had a sleep study when she was in [place] some 11 or 12 years ago which per her report had shown some sleep apnea. She reported weight gain after her hysterectomy 4 years ago. She reported frequent morning headaches, nonrestorative sleep and difficulty with sleep maintenance as well as excessive daytime somnolence. I invited her back for sleep study.    She had a split-night sleep study on 03/18/2014. Baseline sleep efficiency was 86.2% with a latency to sleep of 6.5 minutes and wake after sleep onset of 14.5 minutes with moderate to severe sleep fragmentation noted. She had  a elevated arousal index. She had increased percentages of stage I and stage II sleep, a normal percentage of slow-wave sleep, and absence of REM sleep. She had no significant EKG changes. She had no significant periodic leg movements of sleep. She had mild to moderate and rare loud snoring. She had a total AHI of 52 per hour. Baseline oxygen saturation was 95%, nadir was 89%. She was therefore titrated on CPAP from 5-9 cm. Sleep efficiency during the second portion of the study was increased at 93.1%. Arousal index was normal. She had an increased percentage of stage I and stage II sleep, a very small percentage of slow-wave sleep and 17.5% of REM sleep. Average oxygen saturation was 96%, nadir was 93%. She felt that she had slept about the same as usual but did not wake up with a headache which was better. I reviewed her CPAP compliance data from 04/01/2014 through 04/30/2014 which is 30 days during which time she uses CPAP  every night except for one night. Percent used days greater than 4 hours was 77%, indicating good compliance. Residual AHI was 2.9 per hour indicating inadequate pressure of 9 cm with EPR of 1. Average usage for all night sweats 5 hours and 21 minutes. Leak was low with the 95th percentile at 13.5 L per minute.   I reviewed her compliance data from 03/31/2014 through 05/13/2014 which is a total of 44 days during which time she used CPAP every night except for 2 nights. Percent used days greater than 4 hours was 70%, indicating adequate compliance. Average usage for all nights was 5 hours and 3 minutes. Residual AHI acceptable at 2.6 per hour on a pressure of 9 cm with EPR of 1, leak was low with 13.4 L per minute at the 95th percentile.  Her Past Medical History Is Significant For: Past Medical History:  Diagnosis Date  . Allergy    takes Allegra D daily prn allergies and Flonase prn allergies  . Anxiety    takes Xanax prn anxiety  . Bipolar 2 disorder ([place])    takes Adderall daily  . Depression    takes Prozac daily  . Dizziness   . Hearing impaired    deaf in right ear  . Hiatal hernia   . History of migraine    last one on 10/09/13  . Insomnia    takes Trazodone nightly prn sleep  . Laryngitis   . Muscle spasms of neck    takes Flexeril daily prn muscle spasms  . OSA on CPAP 05/14/2014  . Pleurisy    when in college  . Pneumonia    walking pneumonia in college  . PONV (postoperative nausea and vomiting)   . Shortness of breath    with exertion/sitting  . Urinary frequency   . Viral infection    09/25/13-was given an inhaler d/t SOB;was on ZPak  . Weakness    both hands    Her Past Surgical History Is Significant For: Past Surgical History:  Procedure Laterality Date  . ABDOMINAL HERNIA REPAIR    . ABDOMINAL HYSTERECTOMY    . bladder  stretch    . BUNIONECTOMY    . COLONOSCOPY    . cyst removed from left arm      as a child  . ELBOW FRACTURE SURGERY Right   . fx  nose repair    . tumor removed from left breast  32yr ago  . UPPER GASTROINTESTINAL ENDOSCOPY      Her Family  History Is Significant For: Family History  Problem Relation Age of Onset  . Colon cancer Maternal Grandfather   . Hypertension Maternal Grandfather   . Hyperlipidemia Maternal Grandfather   . Diabetes Maternal Grandfather   . Alzheimer's disease Mother   . Hypertension Mother   . Diabetes Mother   . Hypertension Father   . Heart Problems Father     quadruple bypass  . Hyperlipidemia Father   . Diabetes Sister   . Hypertension Sister   . Hyperlipidemia Sister   . Hyperlipidemia Brother   . Hypertension Brother   . Anuerysm Maternal Grandmother   . Diabetes Paternal Grandmother   . Hypertension Paternal Grandmother   . Hyperlipidemia Paternal Grandmother   . Lung cancer Paternal Grandfather     Her Social History Is Significant For: Social History   Social History  . Marital status: Single    Spouse name: N/A  . Number of children: 0  . Years of education: college   Occupational History  .  [place] Topics  . Smoking status: Never Smoker  . Smokeless tobacco: Never Used  . Alcohol use 0.6 oz/week    1 Glasses of wine per week     Comment: rarely  . Drug use: No  . Sexual activity: Yes    Birth control/ protection: Surgical   Other Topics Concern  . None   Social History Narrative   Patient lives at home with her boyfriend.   Patient works full time Press photographer.   Education college degree   Right handed   Caffeine one cup of coffee daily sometimes tea.    Her Allergies Are:  Allergies  Allergen Reactions  . Demerol Nausea And Vomiting  . Topamax [Topiramate] Other (See Comments)    Fluctuating blood pressure, increased heart rate  . Lamictal [Lamotrigine] Rash  :   Her Current Medications Are:  Outpatient Encounter Prescriptions as of 03/26/2017  Medication Sig  . ALPRAZolam (XANAX) 0.5 MG tablet Take  0.5-0.75 mg by mouth 2 (two) times daily as needed for anxiety or sleep (panic attacks).   Marland Kitchen amphetamine-dextroamphetamine (ADDERALL) 20 MG tablet Take 20 mg by mouth daily.   Marland Kitchen aspirin-acetaminophen-caffeine (EXCEDRIN MIGRAINE) 250-250-65 MG per tablet Take 1-2 tablets by mouth daily as needed for headache or migraine.  . cyclobenzaprine (FLEXERIL) 10 MG tablet TAKE 1/2 -1 TABLET BY ORAL ROUTE 3 TIMES EVERY DAY AS NEEDED FOR MUSCLE SPASMS  . doxycycline (VIBRA-TABS) 100 MG tablet TAKE 1 TABLET TWICE A DAY FOR 10 DAYS  . estazolam (PROSOM) 2 MG tablet Take 2 mg by mouth at bedtime.   Marland Kitchen Fexofenadine-Pseudoephedrine (ALLEGRA-D 24 HOUR PO) Take 1 tablet by mouth daily as needed (allergies).   Marland Kitchen FLUoxetine (PROZAC) 20 MG capsule Take 20 mg by mouth daily.   . fluticasone (FLONASE) 50 MCG/ACT nasal spray Place 2 sprays into both nostrils daily as needed for allergies.   . traZODone (DESYREL) 100 MG tablet Take 100 mg by mouth at bedtime as needed for sleep.   No facility-administered encounter medications on file as of 03/26/2017.   :  Review of Systems:  Out of a complete 14 point review of systems, all are reviewed and negative with the exception of these symptoms as listed below: Review of Systems  Neurological:       Pt presents today to discuss her cpap and her memory. Pt says that she hates her cpap even though it makes her  feel better. Pt says that sometimes she has trouble remembering words.    Objective:  Neurologic Exam  Physical Exam Physical Examination:   Vitals:   03/26/17 1535  BP: (!) 146/80  Pulse: 76  Resp: 20    General Examination: The patient is a very pleasant 56 y.o. female in no acute distress. She appears well-developed and well-nourished and well groomed. She is in good spirits today.  HEENT: Normocephalic, atraumatic, pupils are equal, round and reactive to light and accommodation. Extraocular tracking is good without limitation to gaze excursion or nystagmus  noted. Normal smooth pursuit is noted. Hearing is mildly impaired. She does have a cochlear implant but not the device attached currently. She says she will get a new one. Speech is clear, as usual she speaks fairly fast, no dysarthria. Airway examination reveals mild mouth dryness, adequate dental hygiene, moderate airway crowding, moderate overbite, recent tooth extraction, otherwise no new findings.   Chest: Clear to auscultation without wheezing, rhonchi or crackles noted.  Heart: S1+S2+0, regular and normal without murmurs, rubs or gallops noted.   Abdomen: Soft, non-tender and non-distended with normal bowel sounds appreciated on auscultation.  Extremities: There is no pitting edema in the distal lower extremities bilaterally. Pedal pulses are intact.  Skin: Warm and dry without trophic changes noted.  Musculoskeletal: exam reveals no obvious joint deformities, tenderness or joint swelling or erythema.   Neurologically:  Mental status: The patient is awake, alert and oriented in all 4 spheres. Her immediate and remote memory, attention, language skills and fund of knowledge are appropriate. There is no evidence of aphasia, agnosia, apraxia or anomia. Speech is clear with normal prosody and enunciation. Thought process is linear. Mood is normal and affect is normal.   Her MMSE on 03/17/14: 30/30, AFT 24/minutes, CDT 4/4.   On 03/26/2017: MMSE: 30/30, AFT: CDT: 4/4, AFT: 18/min.   Cranial nerves II - XII are as described above under HEENT exam. In addition: shoulder shrug is normal with equal shoulder height noted. Motor exam: Normal bulk, strength and tone is noted. There is no drift, tremor or rebound. Romberg is negative. Reflexes are 2+ throughout. Fine motor skills and coordination: intact with normal finger taps, normal hand movements, normal rapid alternating patting, normal foot taps and normal foot agility.  Cerebellar testing: No dysmetria or intention tremor on finger to nose  testing. Heel to shin is unremarkable bilaterally. There is no truncal or gait ataxia.  Sensory exam: intact to light touch in the upper and lower extremities.  Gait, station and balance: She stands with no difficulty. No veering to one side is noted. No leaning to one side is noted. Posture is age-appropriate and stance is narrow based. Gait shows normal stride length and normal pace. No problems turning are noted. Tandem walk is unremarkable.             Assessment and Plan:  In summary, Natalie Le is a very pleasant 56 y.o.-year old female with an underlying medical history of ADD, hiatal hernia, irritable bowel syndrome, migraine headaches, chronic low back pain, insomnia, deafness R ear, migraine headaches, memory loss, overweight state, Hx of tick bite and treatment for suspected Lyme d/s, neck surgery with Dr. Sherwood Gambler in January 2017 with good results, who presents for a new consultation of her obstructive sleep apnea and her word finding difficulties. I had ordered an EEG last time in May 2017 but she decided not to pursue it. Her exam is stable. Memory scores are good.  Sleep apnea was severe by number of events in April 2015, her weight at the time was 195, almost 30 pounds more than what it is today. She is agreeable to reevaluation of her sleep apnea with a sleep study. I will order this and we will try to get her in as soon as possible. She may be a candidate for an oral appliance. She has struggled with CPAP since the beginning, was compliant with treatment in the beginning but has more discomfort with the mask including dry mouth, facial pain, difficulty with the headgear and no significant improvement of her sleep at this time. Last time I increased her CPAP pressure from 9 cm to 10 cm. Her AHI is a little better than what it was but her compliance is less and her leak is higher. I will see her back after his sleep study is completed. We will keep her posted as to the results as well and we  may refer her to a dentist for an oral appliance. I answered all her questions today and she was in agreement with the plan. I spent 25 minutes in total face-to-face time with the patient, more than 50% of which was spent in counseling and coordination of care, reviewing test results, reviewing medication and discussing or reviewing the diagnosis of OSA, its prognosis and treatment options. Pertinent laboratory and imaging test results that were available during this visit with the patient were reviewed by me and considered in my medical decision making (see chart for details).

## 2017-04-03 DIAGNOSIS — H903 Sensorineural hearing loss, bilateral: Secondary | ICD-10-CM | POA: Diagnosis not present

## 2017-04-06 DIAGNOSIS — G4733 Obstructive sleep apnea (adult) (pediatric): Secondary | ICD-10-CM | POA: Diagnosis not present

## 2017-04-17 DIAGNOSIS — S92911A Unspecified fracture of right toe(s), initial encounter for closed fracture: Secondary | ICD-10-CM

## 2017-04-17 HISTORY — DX: Unspecified fracture of right toe(s), initial encounter for closed fracture: S92.911A

## 2017-04-27 ENCOUNTER — Emergency Department (HOSPITAL_COMMUNITY)
Admission: EM | Admit: 2017-04-27 | Discharge: 2017-04-27 | Disposition: A | Discharge status: Home / Self Care | Payer: 59 | Attending: Emergency Medicine | Admitting: Emergency Medicine

## 2017-04-27 ENCOUNTER — Encounter (HOSPITAL_COMMUNITY): Payer: Self-pay | Admitting: Emergency Medicine

## 2017-04-27 ENCOUNTER — Emergency Department (HOSPITAL_COMMUNITY): Payer: 59

## 2017-04-27 DIAGNOSIS — S92421B Displaced fracture of distal phalanx of right great toe, initial encounter for open fracture: Secondary | ICD-10-CM | POA: Diagnosis not present

## 2017-04-27 DIAGNOSIS — W228XXA Striking against or struck by other objects, initial encounter: Secondary | ICD-10-CM | POA: Insufficient documentation

## 2017-04-27 DIAGNOSIS — Y9301 Activity, walking, marching and hiking: Secondary | ICD-10-CM | POA: Insufficient documentation

## 2017-04-27 DIAGNOSIS — S91311A Laceration without foreign body, right foot, initial encounter: Secondary | ICD-10-CM

## 2017-04-27 DIAGNOSIS — Z79899 Other long term (current) drug therapy: Secondary | ICD-10-CM | POA: Insufficient documentation

## 2017-04-27 DIAGNOSIS — S91111A Laceration without foreign body of right great toe without damage to nail, initial encounter: Secondary | ICD-10-CM | POA: Insufficient documentation

## 2017-04-27 DIAGNOSIS — Z8673 Personal history of transient ischemic attack (TIA), and cerebral infarction without residual deficits: Secondary | ICD-10-CM | POA: Insufficient documentation

## 2017-04-27 DIAGNOSIS — Y929 Unspecified place or not applicable: Secondary | ICD-10-CM | POA: Diagnosis not present

## 2017-04-27 DIAGNOSIS — Z7982 Long term (current) use of aspirin: Secondary | ICD-10-CM | POA: Insufficient documentation

## 2017-04-27 DIAGNOSIS — Y990 Civilian activity done for income or pay: Secondary | ICD-10-CM | POA: Insufficient documentation

## 2017-04-27 DIAGNOSIS — Z23 Encounter for immunization: Secondary | ICD-10-CM | POA: Insufficient documentation

## 2017-04-27 DIAGNOSIS — S99921A Unspecified injury of right foot, initial encounter: Secondary | ICD-10-CM | POA: Diagnosis present

## 2017-04-27 DIAGNOSIS — S92421A Displaced fracture of distal phalanx of right great toe, initial encounter for closed fracture: Secondary | ICD-10-CM | POA: Diagnosis not present

## 2017-04-27 MED ORDER — BACITRACIN ZINC 500 UNIT/GM EX OINT: 1.0000 "application " | TOPICAL_OINTMENT | Freq: Two times a day (BID) | CUTANEOUS | 1 refills | Status: DC

## 2017-04-27 MED ORDER — SULFAMETHOXAZOLE-TRIMETHOPRIM 800-160 MG PO TABS: 1.0000 | ORAL_TABLET | Freq: Two times a day (BID) | ORAL | 0 refills | Status: DC

## 2017-04-27 MED ORDER — MORPHINE SULFATE (PF) 4 MG/ML IV SOLN
4.0000 mg | Freq: Once | INTRAVENOUS | Status: AC
Start: 2017-04-27 — End: 2017-04-27
  Administered 2017-04-27: 4 mg via INTRAMUSCULAR
  Filled 2017-04-27: qty 1

## 2017-04-27 MED ORDER — NAPROXEN 250 MG PO TABS: 250.0000 mg | ORAL_TABLET | Freq: Two times a day (BID) | ORAL | 0 refills | Status: DC

## 2017-04-27 MED ORDER — HYDROCODONE-ACETAMINOPHEN 5-325 MG PO TABS
1.0000 | ORAL_TABLET | Freq: Four times a day (QID) | ORAL | 0 refills | Status: DC | PRN
Start: 2017-04-27 — End: 2018-08-29

## 2017-04-27 MED ORDER — ONDANSETRON 4 MG PO TBDP
4.0000 mg | ORAL_TABLET | Freq: Once | ORAL | Status: AC
  Administered 2017-04-27: 4 mg via ORAL
  Filled 2017-04-27: qty 1

## 2017-04-27 MED ORDER — TETANUS-DIPHTH-ACELL PERTUSSIS 5-2.5-18.5 LF-MCG/0.5 IM SUSP
0.5000 mL | Freq: Once | INTRAMUSCULAR | Status: AC
Start: 2017-04-27 — End: 2017-04-27
  Administered 2017-04-27: 0.5 mL via INTRAMUSCULAR
  Filled 2017-04-27: qty 0.5

## 2017-04-27 MED ORDER — CEFAZOLIN SODIUM-DEXTROSE 1-4 GM/50ML-% IV SOLN
1.0000 g | Freq: Once | INTRAVENOUS | Status: AC
  Administered 2017-04-27: 1 g via INTRAVENOUS
  Filled 2017-04-27: qty 50

## 2017-04-27 MED ORDER — LIDOCAINE-EPINEPHRINE (PF) 2 %-1:200000 IJ SOLN
10.0000 mL | Freq: Once | INTRAMUSCULAR | Status: AC
  Administered 2017-04-27: 10 mL
  Filled 2017-04-27: qty 20

## 2017-04-27 NOTE — ED Triage Notes (Signed)
Pt was walking her horses and one of them stepped on her right big toe. Pt has same wrapped up. Pt states she felt it break.

## 2017-04-27 NOTE — ED Provider Notes (Signed)
[facility] DEPT Provider Note   CSN: [id number] Arrival date & time: 04/27/17  2353     History   Chief Complaint Chief Complaint  Patient presents with  . Possible Broken Toe    HPI Natalie Le is a 56 y.o. female.  Natalie Le is a 56 y.o. Female who presents to the ED complaining of right foot injury from her horse that occurred just PTA. Patient reports she was working with her horse when it accidentally stepped on her right foot. She was wearing clogs at the time. She reports the horse accidentally stepped on her shoe and toes causing her to have a laceration to her right great toe. She thinks her last Tdap was about 10 years ago, but she is unsure. She denies numbness, tingling, weakeness, other injury.    The history is provided by the patient and medical records. No language interpreter was used.    Past Medical History:  Diagnosis Date  . Allergy    takes Allegra D daily prn allergies and Flonase prn allergies  . Anxiety    takes Xanax prn anxiety  . Bipolar 2 disorder ([place])    takes Adderall daily  . Depression    takes Prozac daily  . Dizziness   . Hearing impaired    deaf in right ear  . Hiatal hernia   . History of migraine    last one on 10/09/13  . Insomnia    takes Trazodone nightly prn sleep  . Laryngitis   . Muscle spasms of neck    takes Flexeril daily prn muscle spasms  . OSA on CPAP 05/14/2014  . Pleurisy    when in college  . Pneumonia    walking pneumonia in college  . PONV (postoperative nausea and vomiting)   . Shortness of breath    with exertion/sitting  . Urinary frequency   . Viral infection    09/25/13-was given an inhaler d/t SOB;was on ZPak  . Weakness    both hands    Patient Active Problem List   Diagnosis Date Noted  . Bipolar disorder ([place]) 04/05/2015  . TIA (transient ischemic attack) 04/04/2015  . Aphasia 04/04/2015  . OSA on CPAP 05/14/2014    Past Surgical History:  Procedure Laterality Date  .  ABDOMINAL HERNIA REPAIR    . ABDOMINAL HYSTERECTOMY    . bladder  stretch    . BUNIONECTOMY    . COLONOSCOPY    . cyst removed from left arm      as a child  . ELBOW FRACTURE SURGERY Right   . fx nose repair    . tumor removed from left breast  35yrs ago  . UPPER GASTROINTESTINAL ENDOSCOPY      OB History    No data available       Home Medications    Prior to Admission medications   Medication Sig Start Date End Date Taking? Authorizing Provider  ALPRAZolam Duanne Moron) 0.5 MG tablet Take 0.5-0.75 mg by mouth 2 (two) times daily as needed for anxiety or sleep (panic attacks).  08/26/14  Yes [provider]  amphetamine-dextroamphetamine (ADDERALL) 20 MG tablet Take 20 mg by mouth 2 (two) times daily as needed.    Yes [provider]  aspirin-acetaminophen-caffeine (EXCEDRIN MIGRAINE) [phone] MG per tablet Take 1-2 tablets by mouth daily as needed for headache or migraine.   Yes [provider]  estazolam (PROSOM) 2 MG tablet Take 2 mg by mouth at bedtime.  04/02/15  Yes [provider]  Fexofenadine-Pseudoephedrine (ALLEGRA-D 24 HOUR PO) Take 1 tablet by mouth daily as needed (allergies).    Yes [provider]  FLUoxetine (PROZAC) 20 MG capsule Take 20 mg by mouth daily.  04/02/15  Yes [provider]  fluticasone (FLONASE) 50 MCG/ACT nasal spray Place 2 sprays into both nostrils daily as needed for allergies.    Yes [provider]  pseudoephedrine-acetaminophen (TYLENOL SINUS) 30-500 MG TABS tablet Take 1 tablet by mouth every 4 (four) hours as needed.   Yes [provider]  traZODone (DESYREL) 100 MG tablet Take 100 mg by mouth at bedtime as needed for sleep.   Yes [provider]  bacitracin ointment Apply 1 application topically 2 (two) times daily. 04/27/17   Waynetta Pean, PA-C  cyclobenzaprine (FLEXERIL) 10 MG tablet TAKE 1/2 -1 TABLET BY ORAL ROUTE 3 TIMES EVERY DAY AS NEEDED FOR MUSCLE SPASMS  02/23/16   [provider]  doxycycline (VIBRA-TABS) 100 MG tablet TAKE 1 TABLET TWICE A DAY FOR 10 DAYS 04/19/16   [provider]  HYDROcodone-acetaminophen (NORCO/VICODIN) 5-325 MG tablet Take 1-2 tablets by mouth every 6 (six) hours as needed for moderate pain or severe pain. 04/27/17   Waynetta Pean, PA-C  naproxen (NAPROSYN) 250 MG tablet Take 1 tablet (250 mg total) by mouth 2 (two) times daily with a meal. 04/27/17   Waynetta Pean, PA-C  sulfamethoxazole-trimethoprim (BACTRIM DS,SEPTRA DS) 800-160 MG tablet Take 1 tablet by mouth 2 (two) times daily. 04/27/17 05/04/17  Waynetta Pean, PA-C    Family History Family History  Problem Relation Age of Onset  . Colon cancer Maternal Grandfather   . Hypertension Maternal Grandfather   . Hyperlipidemia Maternal Grandfather   . Diabetes Maternal Grandfather   . Alzheimer's disease Mother   . Hypertension Mother   . Diabetes Mother   . Hypertension Father   . Heart Problems Father        quadruple bypass  . Hyperlipidemia Father   . Diabetes Sister   . Hypertension Sister   . Hyperlipidemia Sister   . Hyperlipidemia Brother   . Hypertension Brother   . Anuerysm Maternal Grandmother   . Diabetes Paternal Grandmother   . Hypertension Paternal Grandmother   . Hyperlipidemia Paternal Grandmother   . Lung cancer Paternal Grandfather     Social History Social History  Substance Use Topics  . Smoking status: Never Smoker  . Smokeless tobacco: Never Used  . Alcohol use 0.6 oz/week    1 Glasses of wine per week     Comment: rarely     Allergies   Demerol; Topamax [topiramate]; and Lamictal [lamotrigine]   Review of Systems Review of Systems  Constitutional: Negative for fever.  Musculoskeletal: Positive for arthralgias. Negative for back pain and joint swelling.  Skin: Positive for wound.  Neurological: Negative for weakness and numbness.     Physical Exam Updated Vital Signs BP 130/87 (BP Location:  Right Arm)   Pulse 65   Temp 97.8 F (36.6 C) (Oral)   Resp 16   Ht 5\' 7"  (1.702 m)   Wt 74.8 kg   LMP 12/18/2005   SpO2 97%   BMI 25.84 kg/m   Physical Exam  Constitutional: She appears well-developed and well-nourished. No distress.  HENT:  Head: Normocephalic and atraumatic.  Eyes: Right eye exhibits no discharge. Left eye exhibits no discharge.  Cardiovascular: Normal rate, regular rhythm and intact distal pulses.   Pulmonary/Chest: Effort normal. No respiratory distress.  Musculoskeletal: Normal range of motion. She exhibits tenderness. She exhibits no edema or deformity.  3 cm laceration noted to the lateral aspect of her right great toe. 6.5 cm laceration noted to right great toe between her web space medially.  No deformity. Nail bed intact to her toes. Sensation is intact to her right distal toes.   Neurological: She is alert. No sensory deficit. Coordination normal.  Skin: Skin is warm and dry. Capillary refill takes less than 2 seconds. No rash noted. She is not diaphoretic. No erythema. No pallor.  Psychiatric: She has a normal mood and affect. Her behavior is normal.  Nursing note and vitals reviewed.    ED Treatments / Results  Labs (all labs ordered are listed, but only abnormal results are displayed) Labs Reviewed - No data to display  EKG  EKG Interpretation None       Radiology Dg Foot Complete Right  Result Date: 04/27/2017 CLINICAL DATA:  A horse stepped on the patient's right foot today resulting in a laceration. Initial encounter. EXAM: RIGHT FOOT COMPLETE - 3+ VIEW COMPARISON:  None. FINDINGS: The patient has a mildly comminuted fracture of the distal phalanx of the great toe. The fracture includes 2 nondisplaced fracture lines one of which extends from the central aspect of the articular surface distally through the lateral metaphysis and a second extending through the lateral corner of the base of the distal phalanx. A third fracture line is  identified extending from the medial metaphysis through the central aspect of the lateral diaphysis. This component of the fracture is distracted 0.3 cm. No other fracture is identified. IMPRESSION: Mildly comminuted fracture distal phalanx right great toe as described above. Electronically Signed   By: Inge Rise M.D.   On: 04/27/2017 07:51    Procedures .Marland KitchenLaceration Repair Date/Time: 04/27/2017 9:06 AM Performed by: Waynetta Pean Authorized by: Waynetta Pean   Consent:    Consent obtained:  Verbal   Consent given by:  Patient   Risks discussed:  Infection, pain, poor cosmetic result, retained foreign body, need for additional repair and poor wound healing Anesthesia (see MAR for exact dosages):    Anesthesia method:  Nerve block   Block location:  Right great toe   Block needle gauge:  25 G   Block anesthetic:  Lidocaine 1% WITH epi   Block technique:  Digital block    Block injection procedure:  Anatomic landmarks identified, anatomic landmarks palpated, negative aspiration for blood, incremental injection and introduced needle   Block outcome:  Anesthesia achieved Laceration details:    Location:  Toe   Toe location:  R big toe   Length (cm):  6.5   Depth (mm):  3 Repair type:    Repair type:  Intermediate Pre-procedure details:    Preparation:  Patient was prepped and draped in usual sterile fashion and imaging obtained to evaluate for foreign bodies Exploration:    Hemostasis achieved with:  Direct pressure   Wound exploration: wound explored through full range of motion     Wound extent: underlying fracture     Wound extent: no foreign bodies/material noted   Treatment:    Area cleansed with:  Saline   Amount of cleaning:  Standard   Irrigation solution:  Sterile saline   Irrigation volume:  1000 ml   Irrigation method:  Pressure wash Skin repair:    Repair method:  Sutures   Suture size:  4-0   Suture material:  Prolene   Suture technique:  Simple  interrupted   Number of sutures:  6 Approximation:    Approximation:  Loose Post-procedure details:    Dressing:  Antibiotic ointment, non-adherent dressing, sterile dressing and splint for protection   Patient tolerance of procedure:  Tolerated well, no immediate complications  .Marland KitchenLaceration Repair Date/Time: 04/27/2017 9:42 AM Performed by: Waynetta Pean Authorized by: Waynetta Pean   Consent:    Consent obtained:  Verbal   Consent given by:  Patient   Risks discussed:  Infection, pain, poor cosmetic result, retained foreign body, need for additional repair, poor wound healing and nerve damage Anesthesia (see MAR for exact dosages):    Anesthesia method:  Nerve block   Block location:  Right great toe   Block needle gauge:  25 G   Block anesthetic:  Lidocaine 1% WITH epi   Block technique:  Digital block    Block injection procedure:  Anatomic landmarks identified, introduced needle, incremental injection, anatomic landmarks palpated and negative aspiration for blood   Block outcome:  Anesthesia achieved Laceration details:    Location:  Toe   Toe location:  R big toe   Length (cm):  3   Depth (mm):  3 Repair type:    Repair type:  Simple Pre-procedure details:    Preparation:  Patient was prepped and draped in usual sterile fashion and imaging obtained to evaluate for foreign bodies Exploration:    Hemostasis achieved with:  Direct pressure   Wound exploration: wound explored through full range of motion and entire depth of wound probed and visualized     Wound extent: no foreign bodies/material noted   Treatment:    Area cleansed with:  Saline   Amount of cleaning:  Standard   Irrigation solution:  Sterile saline   Irrigation volume:  1000 ml   Irrigation method:  Pressure wash Skin repair:    Repair method:  Sutures   Suture size:  4-0   Suture material:  Prolene   Number of sutures:  2 Approximation:    Approximation:  Close Post-procedure details:     Dressing:  Antibiotic ointment, non-adherent dressing, sterile dressing and splint for protection   Patient tolerance of procedure:  Tolerated well, no immediate complications   (including critical care time)  Medications Ordered in ED Medications  Tdap (BOOSTRIX) injection 0.5 mL (0.5 mLs Intramuscular Given 04/27/17 0653)  lidocaine-EPINEPHrine (XYLOCAINE W/EPI) 2 %-1:200000 (PF) injection 10 mL (10 mLs Infiltration Given 04/27/17 0654)  morphine 4 MG/ML injection 4 mg (4 mg Intramuscular Given 04/27/17 0653)  ondansetron (ZOFRAN-ODT) disintegrating tablet 4 mg (4 mg Oral Given 04/27/17 3419)  ceFAZolin (ANCEF) IVPB 1 g/50 mL premix (0 g Intravenous Stopped 04/27/17 0850)     Initial Impression / Assessment and Plan / ED Course  I have reviewed the triage vital signs and the nursing notes.  Pertinent labs & imaging results that were available during my care of the patient were reviewed by me and considered in my medical decision making (see chart for details).    This  is a 56 y.o. Female who presents to the ED complaining of right foot injury from her horse that occurred just PTA. Patient reports she was working with her horse when it accidentally stepped on her right foot. She was wearing clogs at the time. She reports the horse accidentally stepped on her shoe and toes causing her to have a laceration to her right great toe. On exam the patient has 2 lacerations noted to her right great toe.  She has a 6-1/2 cm laceration between her web space between her first and second toe. She also has a 3 cm laceration of the lateral aspect of her right great toe. X-ray of her right foot indicates a mildly comminuted fracture of the distal phalanx of the right great toe. As this is an open fracture will provide with ancef in the ER. Tdap up dated. She has good capillary refill and distal pulses. No deformity. The laceration is not overlying the fracture.  I consulted with podiatrist Dr. Amalia Hailey who will see  the patient in follow up on Monday. He would like the patient to be on bactrim.  Laceration repair by me and tolerated well by the patient. This was wrapped in Xeroform dressing. Educated on wound care instructions and return precautions. Patient was looked up on the [place] controlled substance database. No recent prescriptions for narcotic pain medicines. She does have a prescription last month for Xanax. I advised her not to take benzodiazepines all taking narcotic pain medicine. Patient agrees. Patient placed in postop shoe. Will discharge at this time with close follow-up by podiatrist Dr. Amalia Hailey. I discussed return precautions. I advised the patient to follow-up with their primary care provider this week. I advised the patient to return to the emergency department with new or worsening symptoms or new concerns. The patient verbalized understanding and agreement with plan.    Final Clinical Impressions(s) / ED Diagnoses   Final diagnoses:  Open displaced fracture of distal phalanx of right great toe, initial encounter  Laceration of right foot, initial encounter    New Prescriptions New Prescriptions   BACITRACIN OINTMENT    Apply 1 application topically 2 (two) times daily.   HYDROCODONE-ACETAMINOPHEN (NORCO/VICODIN) 5-325 MG TABLET    Take 1-2 tablets by mouth every 6 (six) hours as needed for moderate pain or severe pain.   NAPROXEN (NAPROSYN) 250 MG TABLET    Take 1 tablet (250 mg total) by mouth 2 (two) times daily with a meal.   SULFAMETHOXAZOLE-TRIMETHOPRIM (BACTRIM DS,SEPTRA DS) 800-160 MG TABLET    Take 1 tablet by mouth 2 (two) times daily.     Waynetta Pean, PA-C 04/27/17 1033    Julianne Rice, MD 05/02/17 1438

## 2017-04-27 NOTE — ED Notes (Signed)
Applied xeroform and gauze wrap to right toe

## 2017-04-30 ENCOUNTER — Ambulatory Visit (INDEPENDENT_AMBULATORY_CARE_PROVIDER_SITE_OTHER): Payer: 59 | Admitting: Podiatry

## 2017-04-30 ENCOUNTER — Encounter: Payer: Self-pay | Admitting: Podiatry

## 2017-04-30 DIAGNOSIS — S91111A Laceration without foreign body of right great toe without damage to nail, initial encounter: Secondary | ICD-10-CM

## 2017-04-30 MED ORDER — SULFAMETHOXAZOLE-TRIMETHOPRIM 800-160 MG PO TABS: 1.0000 | ORAL_TABLET | Freq: Two times a day (BID) | ORAL | 0 refills | Status: AC

## 2017-04-30 MED ORDER — SULFAMETHOXAZOLE-TRIMETHOPRIM 800-160 MG PO TABS: 1.0000 | ORAL_TABLET | Freq: Two times a day (BID) | ORAL | 0 refills | Status: DC

## 2017-04-30 NOTE — Progress Notes (Signed)
   HPI: Patient is a 56 year old female presenting today as a new patient complaining of an injury to the right great toe sustained three days ago. Pt states her horse stepped on the toe and it is now fractured, confirmed in the ED by X-Ray immediately after the incident. She reports associated lacerations that were repaired in the ED as well. She was prescribed Bactrim DS which she reports taking as directed and has been applying zinc.     Physical Exam: General: The patient is alert and oriented x3 in no acute distress.  Dermatology: Primary curve closure of a right great toe laceration noted. There is some minimal drainage noted with periwound maceration. Sutures are intact. Skin laceration is well coapted  Vascular: Palpable pedal pulses bilaterally. No edema or erythema noted. Capillary refill within normal limits.  Neurological: Epicritic and protective threshold grossly intact bilaterally.   Musculoskeletal Exam: Range of motion within normal limits to all pedal and ankle joints bilateral. Muscle strength 5/5 in all groups bilateral.    Assessment: 1. Right great toe distal phalanx fracture 2. Right great toe laceration    Plan of Care:  1. Patient was evaluated. ED X-Rays reviewed. 2. Dry sterile dressing applied. 3. Refill prescription for Bactrim DS provided. 4. Recommended daily Betadine dressing changes.  5. Return to clinic in one week.    Edrick Kins, DPM [facility]  Dr. Edrick Kins, [address]                                        [place], [place] [address]                Office [phone]  Fax [phone]

## 2017-05-07 ENCOUNTER — Telehealth: Payer: Self-pay | Admitting: Neurology

## 2017-05-07 ENCOUNTER — Ambulatory Visit (INDEPENDENT_AMBULATORY_CARE_PROVIDER_SITE_OTHER): Payer: 59 | Admitting: Podiatry

## 2017-05-07 DIAGNOSIS — G4733 Obstructive sleep apnea (adult) (pediatric): Secondary | ICD-10-CM

## 2017-05-07 DIAGNOSIS — L03031 Cellulitis of right toe: Secondary | ICD-10-CM

## 2017-05-07 DIAGNOSIS — S92424D Nondisplaced fracture of distal phalanx of right great toe, subsequent encounter for fracture with routine healing: Secondary | ICD-10-CM | POA: Diagnosis not present

## 2017-05-07 DIAGNOSIS — R0683 Snoring: Secondary | ICD-10-CM

## 2017-05-07 NOTE — Progress Notes (Signed)
   HPI: Patient is a 56 year old female presenting today as a new patient complaining of an injury to the right great toe sustained three days ago. Pt states her horse stepped on the toe and it is now fractured, confirmed in the ED by X-Ray immediately after the incident. She reports associated lacerations that were repaired in the ED as well. She was prescribed Bactrim DS which she reports taking as directed and has been applying zinc.     Physical Exam: General: The patient is alert and oriented x3 in no acute distress.  Dermatology: Primary closure of a right great toe laceration noted. There is some moderate drainage noted with periwound maceration. Sutures are intact. Today there appears to be a little more dehiscence with edema and drainage. Localized redness noted to be right great toe consistent with possible cellulitis versus fracture. Periwound is macerated. No malodor noted.  Vascular: Palpable pedal pulses bilaterally. Localized edema to the right great toe with erythema  Neurological: Epicritic and protective threshold grossly intact bilaterally.   Musculoskeletal Exam: Range of motion within normal limits to all pedal and ankle joints bilateral. Muscle strength 5/5 in all groups bilateral.    Assessment: 1. Right great toe distal phalanx fracture 2. Right great toe laceration  3. Cellulitis right great toe   Plan of Care:  1. Patient was evaluated.  2. Dry sterile dressing applied 3. Today cultures were taken and sent to pathology for culture and sensitivities 4. Continue daily dressing changes 5. Continue Bactrim DS 6. Return to clinic in 1 week to review cultures  Edrick Kins, DPM [facility]  Dr. Edrick Kins, [address]                                        [place], [place] [address]                Office [phone]  Fax [phone]

## 2017-05-07 NOTE — Telephone Encounter (Addendum)
[employer] denied [place] however approved HST.  Can I get an order for HST?

## 2017-05-08 NOTE — Addendum Note (Signed)
Addended by: Larey Seat on: 05/08/2017 04:21 PM   Modules accepted: Orders

## 2017-05-11 LAB — WOUND CULTURE
Gram Stain: NONE SEEN
Gram Stain: NONE SEEN

## 2017-05-16 ENCOUNTER — Ambulatory Visit (INDEPENDENT_AMBULATORY_CARE_PROVIDER_SITE_OTHER): Payer: 59

## 2017-05-16 ENCOUNTER — Ambulatory Visit (INDEPENDENT_AMBULATORY_CARE_PROVIDER_SITE_OTHER): Payer: 59 | Admitting: Podiatry

## 2017-05-16 ENCOUNTER — Encounter: Payer: Self-pay | Admitting: Podiatry

## 2017-05-16 DIAGNOSIS — S92424D Nondisplaced fracture of distal phalanx of right great toe, subsequent encounter for fracture with routine healing: Secondary | ICD-10-CM

## 2017-05-16 MED ORDER — GENTAMICIN SULFATE 0.1 % EX CREA: 1.0000 "application " | TOPICAL_CREAM | Freq: Three times a day (TID) | CUTANEOUS | 0 refills | Status: DC

## 2017-05-17 NOTE — Progress Notes (Signed)
   HPI: Patient is a 56 year old female presenting today for follow up evaluation of an injury to the right great toe. She states the right great toe looks a lot better at this time.   Physical Exam: General: The patient is alert and oriented x3 in no acute distress.  Dermatology: Primary closure of a right great toe laceration noted. There is some moderate drainage noted with periwound maceration. Sutures are intact. Today there appears to be a little more dehiscence with edema and drainage. Localized redness noted to be right great toe consistent with possible cellulitis versus fracture. Periwound is macerated. No malodor noted.  Vascular: Palpable pedal pulses bilaterally. Localized edema to the right great toe with erythema  Neurological: Epicritic and protective threshold grossly intact bilaterally.   Musculoskeletal Exam: Range of motion within normal limits to all pedal and ankle joints bilateral. Muscle strength 5/5 in all groups bilateral.   Radiographic Exam: Closed nondisplaced fracture of the distal phalanx of the right great toe with routine healing noted   Assessment: 1. Right great toe distal phalanx fracture 2. Right great toe laceration  3. Cellulitis right great toe   Plan of Care:  1. Patient was evaluated.  2. Dressing changed. 3. Prescription for Gentamycin cream given to patient.  4. Return to clinic in 4 weeks.  Edrick Kins, DPM [facility]  Dr. Edrick Kins, [address]                                        [place], [place] [address]                Office [phone]  Fax [phone]

## 2017-06-13 ENCOUNTER — Encounter: Payer: Self-pay | Admitting: Podiatry

## 2017-06-13 ENCOUNTER — Ambulatory Visit (INDEPENDENT_AMBULATORY_CARE_PROVIDER_SITE_OTHER): Payer: 59

## 2017-06-13 ENCOUNTER — Ambulatory Visit (INDEPENDENT_AMBULATORY_CARE_PROVIDER_SITE_OTHER): Payer: 59 | Admitting: Podiatry

## 2017-06-13 DIAGNOSIS — S92424D Nondisplaced fracture of distal phalanx of right great toe, subsequent encounter for fracture with routine healing: Secondary | ICD-10-CM

## 2017-06-15 NOTE — Progress Notes (Signed)
   HPI: Patient is a 56 year old female presenting today for follow up evaluation of an injury to the right great toe. She states her pain has improved overall but still notices some pain when swimming or running. She denies any new complaints at this time.   Physical Exam: General: The patient is alert and oriented x3 in no acute distress.  Dermatology: Ulceration has healed. No open lesions.  Vascular: Palpable pedal pulses bilaterally. Localized edema to the right great toe with erythema  Neurological: Epicritic and protective threshold grossly intact bilaterally.   Musculoskeletal Exam: Moderate edema of the right great toe. Range of motion within normal limits to all pedal and ankle joints bilateral. Muscle strength 5/5 in all groups bilateral.   Radiographic Exam: Closed nondisplaced fracture of the distal phalanx of the right great toe with improvement and routine healing noted   Assessment: 1. Right great toe distal phalanx fracture  Plan of Care:  1. Patient was evaluated. X-rays reviewed. 2. Sutures were removed today. 3. Begin wearing good shoe gear. Discontinue postop shoe. 4. Return to clinic in 6 weeks for follow-up x-ray.  Edrick Kins, DPM [facility]  Dr. Edrick Kins, [address]                                        [place], [place] [address]                Office [phone]  Fax [phone]

## 2017-07-02 ENCOUNTER — Ambulatory Visit (INDEPENDENT_AMBULATORY_CARE_PROVIDER_SITE_OTHER): Payer: 59 | Admitting: Neurology

## 2017-07-02 DIAGNOSIS — R0683 Snoring: Secondary | ICD-10-CM

## 2017-07-02 DIAGNOSIS — G4710 Hypersomnia, unspecified: Secondary | ICD-10-CM

## 2017-07-03 DIAGNOSIS — G4733 Obstructive sleep apnea (adult) (pediatric): Secondary | ICD-10-CM | POA: Diagnosis not present

## 2017-07-04 ENCOUNTER — Telehealth: Payer: Self-pay | Admitting: Neurology

## 2017-07-04 NOTE — Telephone Encounter (Signed)
Patient was last seen on 03/26/17, at which time she was having trouble using CPAP and she had lost weight. Had HST to recheck OSA. (It would not let me do result note for some reason, but study results are in proc encounter). Please call and notify the patient that the recent home sleep test did not show any significant obstructive sleep apnea. Some snoring was noted; for disturbing snoring, an oral appliance (through a qualified dentist) can be considered.   Please remind patient to try to maintain good sleep hygiene, which means: Keep a regular sleep and wake schedule and make enough time for sleep (7 1/2 to 8 1/2 hours for the average adult), try not to exercise or have a meal within 2 hours of your bedtime, try to keep your bedroom conducive for sleep, that is, cool and dark, without light distractors such as an illuminated alarm clock, and refrain from watching TV right before sleep or in the middle of the night and do not keep the TV or radio on during the night. If a nightlight is used, have it away from the visual field. Also, try not to use or play on electronic devices at bedtime, such as your cell phone, tablet PC or laptop. If you like to read at bedtime on an electronic device, try to dim the background light as much as possible. Do not eat in the middle of the night. Keep pets away from the bedroom environment. For stress relief, try meditation, deep breathing exercises (there are many books and CDs available), a white noise machine or fan can help to diffuse other noise distractors, such as traffic noise. Do not drink alcohol before bedtime, as it can disturb sleep and cause middle of the night awakenings. Never mix alcohol and sedating medications! Avoid narcotic pain medication close to bedtime, as opioids/narcotics can suppress breathing drive and breathing effort.    Patient can follow up with her PCP at this point.  Once you have spoken to patient, you can close this encounter.    Thanks,  Star Age, MD, PhD [facility])

## 2017-07-04 NOTE — Procedures (Signed)
[facility] [address], [place] [address] NAME: Natalie Le DOB: 09/15/1961 MEDICAL RECORD [id number] DOS: 06/05/17 REFERRING PHYSICIAN: Leanna Battles, MD STUDY PERFORMED: HST HISTORY: 56 year old woman with a history of ADD, hiatal hernia, irritable bowel syndrome, migraine headaches, chronic low back pain, insomnia, deafness R ear, migraine headaches, mood disorder, and overweight state, who presents for re-evaluation of her OSA after weight loss and difficulty with CPAP usage. She still feels tired during the day.  STUDY RESULTS: Total Recording Time: 10 h, 32 min Total Apnea/Hypopnea Index (AHI): 1.8/hour Average Oxygen Saturation: 94% Lowest Oxygen Saturation: 83%, time below 88% saturation of 1 min Average Heart Rate: 62 bpm Snoring events: snoring was noted, appeared to be mostly mild and intermittent. IMPRESSION: Hypersomnia, NOS; Snoring RECOMMENDATION: This study does not demonstrate any significant obstructive or central sleep disordered breathing. Snoring was noted and appeared to be mostly mild and intermittent. For disturbing snoring, an oral appliance (through a qualified dentist) can be considered. This study does not support sleep disordered breathing as a cause of the patient's symptoms. Other causes, including circadian rhythm disturbances, an underlying mood disorder, medication effect and/or an underlying medical problem cannot be ruled out. The patient should be cautioned not to drive, work at heights, or operate dangerous or heavy equipment when tired or sleepy. Review and reiteration of good sleep hygiene measures should be pursued with any patient. The patient and her referring provider will be notified of the test results. An appointment in sleep clinic will be made as necessary.   I certify that I have reviewed the raw data recording prior to the issuance of this report in accordance with the standards of the American  Academy of Sleep Medicine (AASM).  Star Age, MD, PhD Diplomat, ABPN (Neurology and Sleep)

## 2017-07-05 NOTE — Telephone Encounter (Signed)
I called pt. I advised pt that Dr. Rexene Alberts reviewed pt's sleep study and found that pt's recent HST did not show any significant osa.the HST did note some snoring, so I advised pt to consider an oral appliance, made throug ha qualified dentist, to treat her snoring. I encouraged her to speak with her on dentist to see if they are able to make this appliance, and if not, we can refer her to a local dentist that can. Dr. Rexene Alberts recommends that pt follow up with her PCP. I reviewed sleep hygiene recommendations with the pt, including trying to keep a regular sleep wake schedule, avoiding electronics in the bedroom, keeping the bedroom cool, dark, and quiet, and avoiding eating or exercising within 2 hours of bedtime as well as eating in the middle of the night. I advised pt to keep pets away from the bedtime. I discussed with pt the importance of stress relief and to try meditation, deep breathing exercises, and/or a white noise machine or fan to diffuse other noise distracters. I advised pt to not drink alcohol before bedtime and to never mix alcohol and sedating medications. Pt was advised to avoid narcotic pain medication close to bedtime. I advised pt that a copy of these sleep study results will be sent to Dr. Philip Aspen. Pt verbalized understanding of results. Pt had no questions at this time but was encouraged to call back if questions arise.

## 2017-07-25 ENCOUNTER — Ambulatory Visit (INDEPENDENT_AMBULATORY_CARE_PROVIDER_SITE_OTHER): Payer: 59 | Admitting: Podiatry

## 2017-07-25 DIAGNOSIS — S92424D Nondisplaced fracture of distal phalanx of right great toe, subsequent encounter for fracture with routine healing: Secondary | ICD-10-CM | POA: Diagnosis not present

## 2017-08-01 NOTE — Progress Notes (Signed)
   HPI: Patient is a 56 year old female presenting today for follow up evaluation of an injury to the right great toe. She states her pain has improved overall but still notices some pain when swimming or running. She denies any new complaints at this time.   Physical Exam: General: The patient is alert and oriented x3 in no acute distress.  Dermatology: Ulceration has healed. No open lesions.  Vascular: Palpable pedal pulses bilaterally. Localized edema to the right great toe with erythema  Neurological: Epicritic and protective threshold grossly intact bilaterally.   Musculoskeletal Exam: Negative for any significant edema to the right great toe. Range of motion within normal limits to all pedal and ankle joints bilateral. Muscle strength 5/5 in all groups bilateral. Negative for any significant pain on palpation.  Radiographic Exam: Closed nondisplaced fracture of the distal phalanx of the right great toe. 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 We'll simply observe this time.  Assessment: 1. Right great toe distal phalanx fracture  Plan of Care:  1. Patient was evaluated. X-rays reviewed. 2. Recommend the patient return to full activity no restrictions. Recommend good supportive shoe gear that do not aggravate her symptoms. I did inform the patient that she may have some residual arthritis in the great toe. 3. Return to clinic when necessary   Edrick Kins, DPM [facility]  Dr. Edrick Kins, [address]                                        [place], [place] [address]                Office [phone]  Fax [phone]

## 2018-02-20 DIAGNOSIS — H903 Sensorineural hearing loss, bilateral: Secondary | ICD-10-CM | POA: Diagnosis not present

## 2018-02-20 DIAGNOSIS — H6123 Impacted cerumen, bilateral: Secondary | ICD-10-CM | POA: Diagnosis not present

## 2018-02-20 DIAGNOSIS — H9312 Tinnitus, left ear: Secondary | ICD-10-CM | POA: Diagnosis not present

## 2018-03-01 DIAGNOSIS — Z0000 Encounter for general adult medical examination without abnormal findings: Secondary | ICD-10-CM | POA: Diagnosis not present

## 2018-03-01 DIAGNOSIS — R82998 Other abnormal findings in urine: Secondary | ICD-10-CM | POA: Diagnosis not present

## 2018-03-06 DIAGNOSIS — Z0000 Encounter for general adult medical examination without abnormal findings: Secondary | ICD-10-CM | POA: Diagnosis not present

## 2018-03-06 DIAGNOSIS — E7849 Other hyperlipidemia: Secondary | ICD-10-CM | POA: Diagnosis not present

## 2018-03-06 DIAGNOSIS — Z1389 Encounter for screening for other disorder: Secondary | ICD-10-CM | POA: Diagnosis not present

## 2018-03-28 DIAGNOSIS — Z780 Asymptomatic menopausal state: Secondary | ICD-10-CM | POA: Diagnosis not present

## 2018-03-28 DIAGNOSIS — Z01419 Encounter for gynecological examination (general) (routine) without abnormal findings: Secondary | ICD-10-CM | POA: Diagnosis not present

## 2018-03-28 DIAGNOSIS — Z1231 Encounter for screening mammogram for malignant neoplasm of breast: Secondary | ICD-10-CM | POA: Diagnosis not present

## 2018-03-28 DIAGNOSIS — Z130 Encounter for screening for diseases of the blood and blood-forming organs and certain disorders involving the immune mechanism: Secondary | ICD-10-CM | POA: Diagnosis not present

## 2018-03-28 DIAGNOSIS — M542 Cervicalgia: Secondary | ICD-10-CM | POA: Diagnosis not present

## 2018-03-28 DIAGNOSIS — Z1389 Encounter for screening for other disorder: Secondary | ICD-10-CM | POA: Diagnosis not present

## 2018-07-10 DIAGNOSIS — R1110 Vomiting, unspecified: Secondary | ICD-10-CM | POA: Diagnosis not present

## 2018-07-10 DIAGNOSIS — K921 Melena: Secondary | ICD-10-CM | POA: Diagnosis not present

## 2018-07-10 DIAGNOSIS — K2970 Gastritis, unspecified, without bleeding: Secondary | ICD-10-CM | POA: Diagnosis not present

## 2018-07-11 DIAGNOSIS — K582 Mixed irritable bowel syndrome: Secondary | ICD-10-CM | POA: Diagnosis not present

## 2018-07-11 DIAGNOSIS — K921 Melena: Secondary | ICD-10-CM | POA: Diagnosis not present

## 2018-07-13 ENCOUNTER — Other Ambulatory Visit: Payer: Self-pay | Admitting: Registered Nurse

## 2018-07-13 DIAGNOSIS — R590 Localized enlarged lymph nodes: Secondary | ICD-10-CM

## 2018-07-19 DIAGNOSIS — K921 Melena: Secondary | ICD-10-CM | POA: Diagnosis not present

## 2018-07-19 DIAGNOSIS — K228 Other specified diseases of esophagus: Secondary | ICD-10-CM | POA: Diagnosis not present

## 2018-07-19 DIAGNOSIS — K2930 Chronic superficial gastritis without bleeding: Secondary | ICD-10-CM | POA: Diagnosis not present

## 2018-07-19 DIAGNOSIS — K229 Disease of esophagus, unspecified: Secondary | ICD-10-CM | POA: Diagnosis not present

## 2018-07-30 ENCOUNTER — Ambulatory Visit
Admission: RE | Admit: 2018-07-30 | Discharge: 2018-07-30 | Disposition: A | Discharge status: Home / Self Care | Payer: 59 | Source: Ambulatory Visit | Attending: Registered Nurse | Admitting: Registered Nurse

## 2018-07-30 DIAGNOSIS — R590 Localized enlarged lymph nodes: Secondary | ICD-10-CM

## 2018-07-30 DIAGNOSIS — R599 Enlarged lymph nodes, unspecified: Secondary | ICD-10-CM | POA: Diagnosis not present

## 2018-07-30 MED ORDER — IOPAMIDOL (ISOVUE-300) INJECTION 61%
75.0000 mL | Freq: Once | INTRAVENOUS | Status: AC | PRN
  Administered 2018-07-30: 75 mL via INTRAVENOUS

## 2018-08-26 ENCOUNTER — Other Ambulatory Visit: Payer: Self-pay

## 2018-08-26 ENCOUNTER — Encounter (HOSPITAL_COMMUNITY): Payer: Self-pay

## 2018-08-26 ENCOUNTER — Emergency Department (HOSPITAL_COMMUNITY)
Admission: EM | Admit: 2018-08-26 | Discharge: 2018-08-26 | Disposition: A | Discharge status: Home / Self Care | Payer: 59 | Attending: Emergency Medicine | Admitting: Emergency Medicine

## 2018-08-26 ENCOUNTER — Emergency Department (HOSPITAL_COMMUNITY): Payer: 59

## 2018-08-26 DIAGNOSIS — Z79899 Other long term (current) drug therapy: Secondary | ICD-10-CM | POA: Diagnosis not present

## 2018-08-26 DIAGNOSIS — Z7902 Long term (current) use of antithrombotics/antiplatelets: Secondary | ICD-10-CM | POA: Insufficient documentation

## 2018-08-26 DIAGNOSIS — N201 Calculus of ureter: Secondary | ICD-10-CM | POA: Diagnosis not present

## 2018-08-26 DIAGNOSIS — R1031 Right lower quadrant pain: Secondary | ICD-10-CM | POA: Diagnosis present

## 2018-08-26 LAB — CBC WITH DIFFERENTIAL/PLATELET
Basophils Absolute: 0 10*3/uL (ref 0.0–0.1)
Basophils Relative: 0 %
EOS PCT: 1 %
Eosinophils Absolute: 0.1 10*3/uL (ref 0.0–0.7)
HEMATOCRIT: 39.7 % (ref 36.0–46.0)
HEMOGLOBIN: 13.2 g/dL (ref 12.0–15.0)
LYMPHS ABS: 1.5 10*3/uL (ref 0.7–4.0)
LYMPHS PCT: 12 %
MCH: 29.8 pg (ref 26.0–34.0)
MCHC: 33.2 g/dL (ref 30.0–36.0)
MCV: 89.6 fL (ref 78.0–100.0)
Monocytes Absolute: 0.8 10*3/uL (ref 0.1–1.0)
Monocytes Relative: 6 %
NEUTROS ABS: 10.5 10*3/uL — AB (ref 1.7–7.7)
NEUTROS PCT: 81 %
Platelets: 244 10*3/uL (ref 150–400)
RBC: 4.43 MIL/uL (ref 3.87–5.11)
RDW: 13 % (ref 11.5–15.5)
WBC: 12.8 10*3/uL — AB (ref 4.0–10.5)

## 2018-08-26 LAB — URINALYSIS, ROUTINE W REFLEX MICROSCOPIC
Bilirubin Urine: NEGATIVE
Glucose, UA: NEGATIVE mg/dL
Ketones, ur: NEGATIVE mg/dL
Nitrite: NEGATIVE
PH: 5 (ref 5.0–8.0)
Protein, ur: 30 mg/dL — AB
RBC / HPF: >50 RBC/hpf — ABNORMAL HIGH (ref 0–5)
SPECIFIC GRAVITY, URINE: 1.018 (ref 1.005–1.030)

## 2018-08-26 LAB — COMPREHENSIVE METABOLIC PANEL
ALT: 13 U/L (ref 0–44)
ANION GAP: 8 (ref 5–15)
AST: 13 U/L — ABNORMAL LOW (ref 15–41)
Albumin: 3.7 g/dL (ref 3.5–5.0)
Alkaline Phosphatase: 65 U/L (ref 38–126)
BUN: 20 mg/dL (ref 6–20)
CHLORIDE: 107 mmol/L (ref 98–111)
CO2: 26 mmol/L (ref 22–32)
Calcium: 9.1 mg/dL (ref 8.9–10.3)
Creatinine, Ser: 0.85 mg/dL (ref 0.44–1.00)
GFR calc Af Amer: >60 mL/min (ref >60)
Glucose, Bld: 112 mg/dL — ABNORMAL HIGH (ref 70–99)
POTASSIUM: 4.4 mmol/L (ref 3.5–5.1)
Sodium: 141 mmol/L (ref 135–145)
Total Bilirubin: 0.6 mg/dL (ref 0.3–1.2)
Total Protein: 6.7 g/dL (ref 6.5–8.1)

## 2018-08-26 LAB — LIPASE, BLOOD: Lipase: 28 U/L (ref 11–51)

## 2018-08-26 MED ORDER — OXYCODONE-ACETAMINOPHEN 5-325 MG PO TABS: 1.0000 | ORAL_TABLET | Freq: Four times a day (QID) | ORAL | 0 refills | Status: DC | PRN

## 2018-08-26 MED ORDER — TAMSULOSIN HCL 0.4 MG PO CAPS: ORAL_CAPSULE | ORAL | 0 refills | Status: DC

## 2018-08-26 MED ORDER — TAMSULOSIN HCL 0.4 MG PO CAPS
0.4000 mg | ORAL_CAPSULE | Freq: Once | ORAL | Status: AC
  Administered 2018-08-26: 0.4 mg via ORAL
  Filled 2018-08-26: qty 1

## 2018-08-26 MED ORDER — CEPHALEXIN 500 MG PO CAPS
500.0000 mg | ORAL_CAPSULE | Freq: Once | ORAL | Status: AC
  Administered 2018-08-26: 500 mg via ORAL
  Filled 2018-08-26: qty 1

## 2018-08-26 MED ORDER — ONDANSETRON HCL 4 MG PO TABS: 4.0000 mg | ORAL_TABLET | Freq: Three times a day (TID) | ORAL | 0 refills | Status: DC | PRN

## 2018-08-26 MED ORDER — CEPHALEXIN 500 MG PO CAPS: 500.0000 mg | ORAL_CAPSULE | Freq: Three times a day (TID) | ORAL | 0 refills | Status: DC

## 2018-08-26 NOTE — ED Triage Notes (Addendum)
Pt reports R sided flank pain that radiates to her groin. Endorses dysuria and pressure with urination. Pain started around 1230a and has eased up somewhat. Reports slight nausea, denies fever, vomiting, hematuria, or diarrhea. A&Ox4. Ambulatory.

## 2018-08-26 NOTE — ED Provider Notes (Signed)
[facility] DEPT Provider Note   CSN: [id number] Arrival date & time: 08/26/18  0120  Time seen 02:50 AM   History   Chief Complaint Chief Complaint  Patient presents with  . Flank Pain    R    HPI [name] is a 57 y.o. female.  HPI when I enter the room patient is asleep and snoring.  Patient states at 1230 she was awakened from sleep with sharp pain in her right flank that she describes as excruciating.  She states it was a "10" and she has a high pain tolerance.  She states she took a hot shower without relief.  She states sometimes movement makes it hurt more.  She states it started feeling better on the way here.  She states there is some radiation into her right upper quadrant.  She had some nausea without vomiting, hematuria, or fever.  She states she has been having some pressure in her vaginal area and has some urgency without dysuria.  She states she is having dribbling, small amounts of urine when she goes.  She states she is never had this before.  She is not aware of any history of kidney stones in her family.  Patient states 6 weeks ago she had vomiting and was vomiting blood and having black stools for 2 days.  She went to her PCP and had a normal hemoglobin.  She states she is on some type of stomach acid medication that starts with a P.  PCP Natalie Battles, MD   Past Medical History:  Diagnosis Date  . Allergy    takes Allegra D daily prn allergies and Flonase prn allergies  . Anxiety    takes Xanax prn anxiety  . Bipolar 2 disorder ([place])    takes Adderall daily  . Depression    takes Prozac daily  . Dizziness   . Hearing impaired    deaf in right ear  . Hiatal hernia   . History of migraine    last one on 10/09/13  . Insomnia    takes Trazodone nightly prn sleep  . Laryngitis   . Muscle spasms of neck    takes Flexeril daily prn muscle spasms  . OSA on CPAP 05/14/2014  . Pleurisy    when in college  . Pneumonia     walking pneumonia in college  . PONV (postoperative nausea and vomiting)   . Shortness of breath    with exertion/sitting  . Urinary frequency   . Viral infection    09/25/13-was given an inhaler d/t SOB;was on ZPak  . Weakness    both hands    Patient Active Problem List   Diagnosis Date Noted  . Bipolar disorder ([place]) 04/05/2015  . TIA (transient ischemic attack) 04/04/2015  . Aphasia 04/04/2015  . OSA on CPAP 05/14/2014    Past Surgical History:  Procedure Laterality Date  . ABDOMINAL HERNIA REPAIR    . ABDOMINAL HYSTERECTOMY    . bladder  stretch    . BUNIONECTOMY    . COLONOSCOPY    . cyst removed from left arm      as a child  . ELBOW FRACTURE SURGERY Right   . fx nose repair    . tumor removed from left breast  52yrs ago  . UPPER GASTROINTESTINAL ENDOSCOPY       OB History   None      Home Medications    Prior to Admission medications  Medication Sig Start Date End Date Taking? Authorizing Provider  amphetamine-dextroamphetamine (ADDERALL) 20 MG tablet Take 20 mg by mouth 2 (two) times daily.    Yes [provider]  dicyclomine (BENTYL) 10 MG capsule Take 20 mg by mouth 3 (three) times daily as needed for spasms.  08/15/18  Yes [provider]  estazolam (PROSOM) 2 MG tablet Take 2 mg by mouth at bedtime.  02/16/16  Yes [provider]  FLUoxetine (PROZAC) 40 MG capsule Take 60 mg by mouth daily.  05/29/18  Yes [provider]  pantoprazole (PROTONIX) 40 MG tablet Take 40 mg by mouth 2 (two) times daily. 08/06/18  Yes [provider]  pravastatin (PRAVACHOL) 40 MG tablet Take 20 mg by mouth every other day.  08/05/18  Yes [provider]  pseudoephedrine-acetaminophen (TYLENOL SINUS) 30-500 MG TABS tablet Take 1 tablet by mouth every 4 (four) hours as needed. Congestion   Yes [provider]  traZODone (DESYREL) 150 MG tablet Take 75-150 mg by mouth at bedtime as needed for sleep.  08/01/18  Yes  [provider]  ALPRAZolam Duanne Moron) 0.5 MG tablet Take 0.5-0.75 mg by mouth 2 (two) times daily as needed for anxiety or sleep (panic attacks).  08/26/14   [provider]  bacitracin ointment Apply 1 application topically 2 (two) times daily. Patient not taking: Reported on 08/26/2018 04/27/17   Natalie Pean, PA-C  cephALEXin (KEFLEX) 500 MG capsule Take 1 capsule (500 mg total) by mouth 3 (three) times daily. 08/26/18   Rolland Porter, MD  gentamicin cream (GARAMYCIN) 0.1 % Apply 1 application topically 3 (three) times daily. Patient not taking: Reported on 08/26/2018 05/16/17   Natalie Le, DPM  HYDROcodone-acetaminophen (NORCO/VICODIN) 5-325 MG tablet Take 1-2 tablets by mouth every 6 (six) hours as needed for moderate pain or severe pain. Patient not taking: Reported on 08/26/2018 04/27/17   Natalie Pean, PA-C  naproxen (NAPROSYN) 250 MG tablet Take 1 tablet (250 mg total) by mouth 2 (two) times daily with a meal. Patient not taking: Reported on 08/26/2018 04/27/17   Natalie Pean, PA-C  ondansetron (ZOFRAN) 4 MG tablet Take 1 tablet (4 mg total) by mouth every 8 (eight) hours as needed for nausea or vomiting. 08/26/18   Rolland Porter, MD  oxyCODONE-acetaminophen (PERCOCET/ROXICET) 5-325 MG tablet Take 1 tablet by mouth every 6 (six) hours as needed for moderate pain or severe pain. 08/26/18   Rolland Porter, MD  tamsulosin (FLOMAX) 0.4 MG CAPS capsule Take 1 po QD until you pass the stone. 08/26/18   Rolland Porter, MD    Family History Family History  Problem Relation Age of Onset  . Colon cancer Maternal Grandfather   . Hypertension Maternal Grandfather   . Hyperlipidemia Maternal Grandfather   . Diabetes Maternal Grandfather   . Alzheimer's disease Mother   . Hypertension Mother   . Diabetes Mother   . Hypertension Father   . Heart Problems Father        quadruple bypass  . Hyperlipidemia Father   . Diabetes Sister   . Hypertension Sister   . Hyperlipidemia Sister   . Hyperlipidemia  Brother   . Hypertension Brother   . Anuerysm Maternal Grandmother   . Diabetes Paternal Grandmother   . Hypertension Paternal Grandmother   . Hyperlipidemia Paternal Grandmother   . Lung cancer Paternal Grandfather     Social History Social History   Tobacco Use  . Smoking status: Never Smoker  . Smokeless tobacco: Never Used  Substance Use Topics  . Alcohol use: Yes    Alcohol/week: 1.0 standard drinks    Types: 1 Glasses of wine per week    Comment: rarely  . Drug use: No  Hard of hearing   Allergies   Demerol; Topamax [topiramate]; and Lamictal [lamotrigine]   Review of Systems Review of Systems  All other systems reviewed and are negative.    Physical Exam Updated Vital Signs BP (!) 101/59 (BP Location: Left Arm)   Pulse 60   Temp 97.6 F (36.4 C) (Oral)   Resp 16   LMP 12/18/2005   SpO2 99%   Physical Exam  Constitutional: She is oriented to person, place, and time. She appears well-developed and well-nourished.  Non-toxic appearance. She does not appear ill. No distress.  Pt is asleep and snoring, I had to shake her leg to wake her up.   HENT:  Head: Normocephalic and atraumatic.  Right Ear: External ear normal.  Left Ear: External ear normal.  Nose: Nose normal. No mucosal edema or rhinorrhea.  Mouth/Throat: Oropharynx is clear and moist and mucous membranes are normal. No dental abscesses or uvula swelling.  Eyes: Pupils are equal, round, and reactive to light. Conjunctivae and EOM are normal.  Neck: Normal range of motion and full passive range of motion without pain. Neck supple.  Cardiovascular: Normal rate, regular rhythm and normal heart sounds. Exam reveals no gallop and no friction rub.  No murmur heard. Pulmonary/Chest: Effort normal and breath sounds normal. No respiratory distress. She has no wheezes. She has no rhonchi. She has no rales. She exhibits no tenderness and no crepitus.  Abdominal: Soft. Normal appearance and bowel sounds are  normal. She exhibits no distension. There is no tenderness. There is no rebound and no guarding.  + right flank tenderness  Musculoskeletal: Normal range of motion. She exhibits no edema or tenderness.  Moves all extremities well.   Neurological: She is alert and oriented to person, place, and time. She has normal strength. No cranial nerve deficit.  Skin: Skin is warm, dry and intact. No rash noted. No erythema. No pallor.  Psychiatric: She has a normal mood and affect. Her speech is normal and behavior is normal. Her mood appears not anxious.  Nursing note and vitals reviewed.    ED Treatments / Results  Labs (all labs ordered are listed, but only abnormal results are displayed) Results for orders placed or performed during the hospital encounter of 08/26/18  Urinalysis, Routine w reflex microscopic- may I&O cath if menses  Result Value Ref Range   Color, Urine YELLOW YELLOW   APPearance HAZY (A) CLEAR   Specific Gravity, Urine 1.018 1.005 - 1.030   pH 5.0 5.0 - 8.0   Glucose, UA NEGATIVE NEGATIVE mg/dL   Hgb urine dipstick LARGE (A) NEGATIVE   Bilirubin Urine NEGATIVE NEGATIVE   Ketones, ur NEGATIVE NEGATIVE mg/dL   Protein, ur 30 (A) NEGATIVE mg/dL   Nitrite NEGATIVE NEGATIVE   Leukocytes, UA MODERATE (A) NEGATIVE   RBC / HPF >50 (H) 0 - 5 RBC/hpf   WBC, UA 11-20 0 - 5 WBC/hpf   Bacteria, UA RARE (A) NONE SEEN   Squamous Epithelial / LPF 0-5 0 - 5   Mucus PRESENT   Comprehensive metabolic panel  Result Value Ref Range   Sodium 141 135 - 145 mmol/L   Potassium 4.4 3.5 - 5.1 mmol/L   Chloride 107 98 - 111 mmol/L   CO2 26 22 - 32 mmol/L  Glucose, Bld 112 (H) 70 - 99 mg/dL   BUN 20 6 - 20 mg/dL   Creatinine, Ser 0.85 0.44 - 1.00 mg/dL   Calcium 9.1 8.9 - 10.3 mg/dL   Total Protein 6.7 6.5 - 8.1 g/dL   Albumin 3.7 3.5 - 5.0 g/dL   AST 13 (L) 15 - 41 U/L   ALT 13 0 - 44 U/L   Alkaline Phosphatase 65 38 - 126 U/L   Total Bilirubin 0.6 0.3 - 1.2 mg/dL   GFR calc non Af  Amer >60 >60 mL/min   GFR calc Af Amer >60 >60 mL/min   Anion gap 8 5 - 15  CBC with Differential  Result Value Ref Range   WBC 12.8 (H) 4.0 - 10.5 K/uL   RBC 4.43 3.87 - 5.11 MIL/uL   Hemoglobin 13.2 12.0 - 15.0 g/dL   HCT 39.7 36.0 - 46.0 %   MCV 89.6 78.0 - 100.0 fL   MCH 29.8 26.0 - 34.0 pg   MCHC 33.2 30.0 - 36.0 g/dL   RDW 13.0 11.5 - 15.5 %   Platelets 244 150 - 400 K/uL   Neutrophils Relative % 81 %   Neutro Abs 10.5 (H) 1.7 - 7.7 K/uL   Lymphocytes Relative 12 %   Lymphs Abs 1.5 0.7 - 4.0 K/uL   Monocytes Relative 6 %   Monocytes Absolute 0.8 0.1 - 1.0 K/uL   Eosinophils Relative 1 %   Eosinophils Absolute 0.1 0.0 - 0.7 K/uL   Basophils Relative 0 %   Basophils Absolute 0.0 0.0 - 0.1 K/uL  Lipase, blood  Result Value Ref Range   Lipase 28 11 - 51 U/L   Laboratory interpretation all normal except leukocytosis, hematuria and possible UTI, urine culture was sent.     EKG None  Radiology Ct Renal Stone Study  Result Date: 08/26/2018 CLINICAL DATA:  Flank pain EXAM: CT ABDOMEN AND PELVIS WITHOUT CONTRAST TECHNIQUE: Multidetector CT imaging of the abdomen and pelvis was performed following the standard protocol without IV contrast. COMPARISON:  None. FINDINGS: LOWER CHEST: There is no basilar pleural or apical pericardial effusion. HEPATOBILIARY: The hepatic contours and density are normal. There is no intra- or extrahepatic biliary dilatation. The gallbladder is normal. PANCREAS: The pancreatic parenchymal contours are normal and there is no ductal dilatation. There is no peripancreatic fluid collection. SPLEEN: Normal. ADRENALS/URINARY TRACT: --Adrenal glands: Normal. --Right kidney/ureter: There is a 4 x 3 mm stone at the right ureterovesical junction. There is mild right pelviectasis. No hydroureteronephrosis or perinephric edema. --Left kidney/ureter: No hydronephrosis, nephroureterolithiasis, perinephric stranding or solid renal mass. --Urinary bladder: Normal for  degree of distention STOMACH/BOWEL: --Stomach/Duodenum: There is no hiatal hernia or other gastric abnormality. The duodenal course and caliber are normal. --Small bowel: No dilatation or inflammation. --Colon: No focal abnormality. --Appendix: Not visualized. No right lower quadrant inflammation or free fluid. VASCULAR/LYMPHATIC: Normal course and caliber of the major abdominal vessels. No abdominal or pelvic lymphadenopathy. REPRODUCTIVE: Status post hysterectomy. No adnexal mass. MUSCULOSKELETAL. No bony spinal canal stenosis or focal osseous abnormality. OTHER: None. IMPRESSION: 1. 4 x 3 mm stone at the right ureterovesical junction without hydronephrosis or hydroureter. 2. Otherwise unremarkable CT of the abdomen and pelvis. Electronically Signed   By: Ulyses Jarred M.D.   On: 08/26/2018 03:52     Ct Soft Tissue Neck W Contrast  Result Date: 07/31/2018 CLINICAL DATA:  Right sided lymphadenopathy since November of last year.  IMPRESSION: Skin marker in the area of  concern overlies 2 nearby posterior triangle nodes on the right, measuring 6-7 mm and size. These are not likely significant. Nodes of this size in this location are often reactive to folliculitis. Electronically Signed   By: Nelson Chimes M.D.   On: 07/31/2018 07:41   Procedures Procedures (including critical care time)  Medications Ordered in ED Medications  tamsulosin (FLOMAX) capsule 0.4 mg (has no administration in time range)  cephALEXin (KEFLEX) capsule 500 mg (has no administration in time range)     Initial Impression / Assessment and Plan / ED Course  I have reviewed the triage vital signs and the nursing notes.  Pertinent labs & imaging results that were available during my care of the patient were reviewed by me and considered in my medical decision making (see chart for details).     Patient describes her pain and it sounds like a possible kidney stone or gallbladder inflammation.  We discussed that sometimes when  the stones stops moving the pain will improve and then return again.  CT renal was done to look for kidney stone.  Also laboratory testing was done to look for inflammation of the gallbladder.  4:30 AM I discussed her CT results.  Patient has remained pain-free during her ED visit.  She was given prescriptions to take if the pain should return when she finishes passing the stone.  She was also started on Keflex for possible UTI.  Review of the [place] shows patient gets #90 Adderall monthly, last filled August 15.  She also gets #30 alprazolam 0.5 mg tablets filled monthly, last filled August 15, and #30 Estazolam 2 mg monthly, last filled August 15.  These are all prescribed by Noemi Chapel.   Final Clinical Impressions(s) / ED Diagnoses   Final diagnoses:  Right ureteral stone    ED Discharge Orders         Ordered    cephALEXin (KEFLEX) 500 MG capsule  3 times daily     08/26/18 0437    tamsulosin (FLOMAX) 0.4 MG CAPS capsule     08/26/18 0437    ondansetron (ZOFRAN) 4 MG tablet  Every 8 hours PRN     08/26/18 0437    oxyCODONE-acetaminophen (PERCOCET/ROXICET) 5-325 MG tablet  Every 6 hours PRN     08/26/18 0437          Plan discharge  Rolland Porter, MD, Barbette Or, MD 08/26/18 [phone]

## 2018-08-26 NOTE — ED Notes (Signed)
ED Provider at bedside. 

## 2018-08-26 NOTE — Discharge Instructions (Addendum)
Drink plenty of fluids. Take the medications as prescribed. Return to the ED if you get fever or have uncontrolled vomiting or pain.  You can call [facility] to be evaluated by Dr. Lovena Neighbours, urologist if you are not passing the stone in the next week or if you have any other problems.

## 2018-08-27 LAB — URINE CULTURE: SPECIAL REQUESTS: NORMAL

## 2018-08-28 ENCOUNTER — Encounter (HOSPITAL_BASED_OUTPATIENT_CLINIC_OR_DEPARTMENT_OTHER): Payer: Self-pay

## 2018-08-28 ENCOUNTER — Other Ambulatory Visit: Payer: Self-pay | Admitting: Urology

## 2018-08-28 DIAGNOSIS — R3915 Urgency of urination: Secondary | ICD-10-CM | POA: Diagnosis not present

## 2018-08-28 DIAGNOSIS — R3982 Chronic bladder pain: Secondary | ICD-10-CM | POA: Diagnosis not present

## 2018-08-28 DIAGNOSIS — N201 Calculus of ureter: Secondary | ICD-10-CM | POA: Diagnosis not present

## 2018-08-29 ENCOUNTER — Encounter (HOSPITAL_BASED_OUTPATIENT_CLINIC_OR_DEPARTMENT_OTHER): Payer: Self-pay

## 2018-08-29 ENCOUNTER — Other Ambulatory Visit: Payer: Self-pay

## 2018-08-29 NOTE — Progress Notes (Signed)
Spoke with: Archie Patten NPO:  No food after midnight/Clear liquids until 8:00AM DOS Arrival time: 1200N Labs: N/A AM medications:  None per patient decision Pre op orders: Yes Ride home:Tena Hoyt Koch (sister) [phone] or Bradly Bienenstock (friend) [phone]

## 2018-08-30 ENCOUNTER — Ambulatory Visit (HOSPITAL_BASED_OUTPATIENT_CLINIC_OR_DEPARTMENT_OTHER): Admission: RE | Admit: 2018-08-30 | Payer: 59 | Source: Ambulatory Visit | Admitting: Urology

## 2018-08-30 HISTORY — DX: Unspecified hearing loss, right ear: H91.91

## 2018-08-30 HISTORY — DX: Low back pain: M54.5

## 2018-08-30 HISTORY — DX: Transient cerebral ischemic attack, unspecified: G45.9

## 2018-08-30 HISTORY — DX: Other chronic pain: G89.29

## 2018-08-30 HISTORY — DX: Low back pain, unspecified: M54.50

## 2018-08-30 HISTORY — DX: Other specified behavioral and emotional disorders with onset usually occurring in childhood and adolescence: F98.8

## 2018-08-30 HISTORY — DX: Unspecified osteoarthritis, unspecified site: M19.90

## 2018-08-30 HISTORY — DX: Irritable bowel syndrome, unspecified: K58.9

## 2018-08-30 HISTORY — DX: Calculus of ureter: N20.1

## 2018-08-30 HISTORY — DX: Cardiomegaly: I51.7

## 2018-08-30 HISTORY — DX: Unspecified fracture of right toe(s), initial encounter for closed fracture: S92.911A

## 2018-08-30 HISTORY — DX: Personal history of (healed) traumatic fracture: Z87.81

## 2018-08-30 SURGERY — CYSTOSCOPY/URETEROSCOPY/HOLMIUM LASER/STENT PLACEMENT
Anesthesia: General | Laterality: Right

## 2018-11-21 DIAGNOSIS — D3132 Benign neoplasm of left choroid: Secondary | ICD-10-CM | POA: Diagnosis not present

## 2018-11-21 DIAGNOSIS — H2513 Age-related nuclear cataract, bilateral: Secondary | ICD-10-CM | POA: Diagnosis not present

## 2018-12-05 DIAGNOSIS — H903 Sensorineural hearing loss, bilateral: Secondary | ICD-10-CM | POA: Diagnosis not present

## 2018-12-05 DIAGNOSIS — H6123 Impacted cerumen, bilateral: Secondary | ICD-10-CM | POA: Diagnosis not present

## 2018-12-05 DIAGNOSIS — H9312 Tinnitus, left ear: Secondary | ICD-10-CM | POA: Diagnosis not present

## 2018-12-09 DIAGNOSIS — H8102 Meniere's disease, left ear: Secondary | ICD-10-CM | POA: Diagnosis not present

## 2018-12-09 DIAGNOSIS — H9312 Tinnitus, left ear: Secondary | ICD-10-CM | POA: Diagnosis not present

## 2018-12-09 DIAGNOSIS — H903 Sensorineural hearing loss, bilateral: Secondary | ICD-10-CM | POA: Diagnosis not present

## 2018-12-23 DIAGNOSIS — H903 Sensorineural hearing loss, bilateral: Secondary | ICD-10-CM | POA: Diagnosis not present

## 2018-12-23 DIAGNOSIS — H9312 Tinnitus, left ear: Secondary | ICD-10-CM | POA: Diagnosis not present

## 2018-12-23 DIAGNOSIS — H8102 Meniere's disease, left ear: Secondary | ICD-10-CM | POA: Diagnosis not present

## 2019-01-20 DIAGNOSIS — H903 Sensorineural hearing loss, bilateral: Secondary | ICD-10-CM | POA: Diagnosis not present

## 2019-01-23 DIAGNOSIS — F3181 Bipolar II disorder: Secondary | ICD-10-CM | POA: Diagnosis not present

## 2019-01-23 DIAGNOSIS — F900 Attention-deficit hyperactivity disorder, predominantly inattentive type: Secondary | ICD-10-CM | POA: Diagnosis not present

## 2019-01-30 DIAGNOSIS — L821 Other seborrheic keratosis: Secondary | ICD-10-CM | POA: Diagnosis not present

## 2019-01-30 DIAGNOSIS — L814 Other melanin hyperpigmentation: Secondary | ICD-10-CM | POA: Diagnosis not present

## 2019-01-30 DIAGNOSIS — L905 Scar conditions and fibrosis of skin: Secondary | ICD-10-CM | POA: Diagnosis not present

## 2019-01-30 DIAGNOSIS — L820 Inflamed seborrheic keratosis: Secondary | ICD-10-CM | POA: Diagnosis not present

## 2019-01-30 DIAGNOSIS — D485 Neoplasm of uncertain behavior of skin: Secondary | ICD-10-CM | POA: Diagnosis not present

## 2019-02-10 IMAGING — CT CT RENAL STONE PROTOCOL
2 of 4 series · 16 of 46 positions shown, 18 images · non-contrast
Comparison: None.

CLINICAL DATA: Flank pain

EXAM:
CT ABDOMEN AND PELVIS WITHOUT CONTRAST
TECHNIQUE: Multidetector CT imaging of the abdomen and pelvis was performed
following the standard protocol without IV contrast.

[Series 2: axial st · axial · 0.69mm/px · z∈[+875,+1325]mm · 13 of 101 slices shown, 15 images]
[im 6/101  soft-tissue]
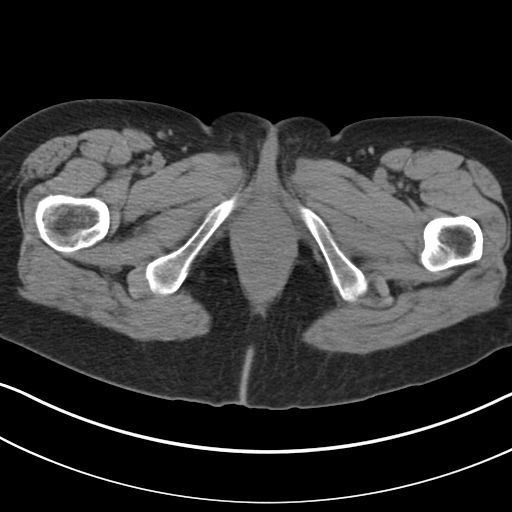
[im 6/101  bone]
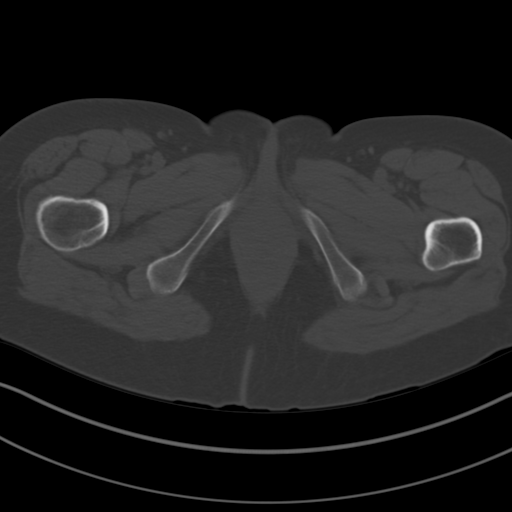
[im 16/101  soft-tissue]
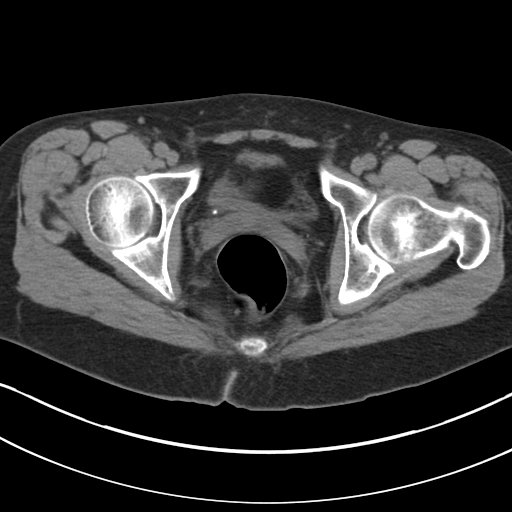
[im 21/101  soft-tissue]
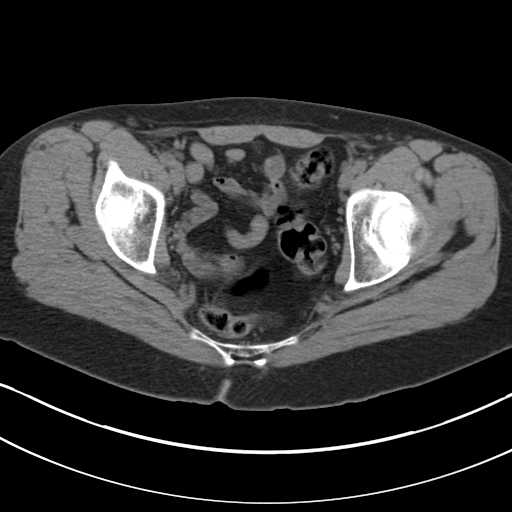
[im 31/101  soft-tissue]
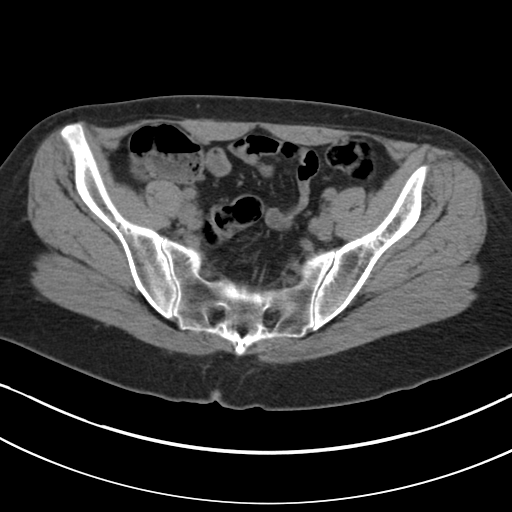
[im 36/101  soft-tissue]
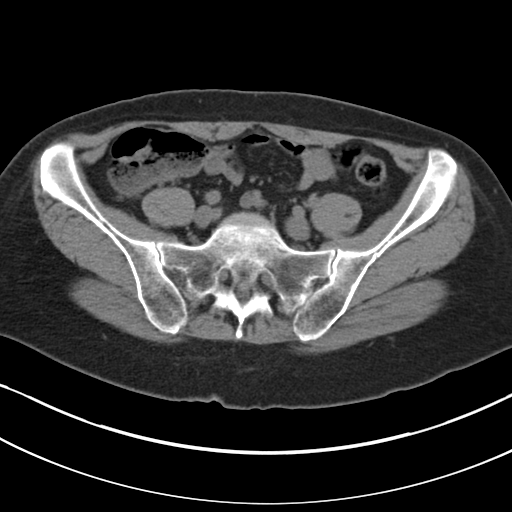
[im 46/101  soft-tissue]
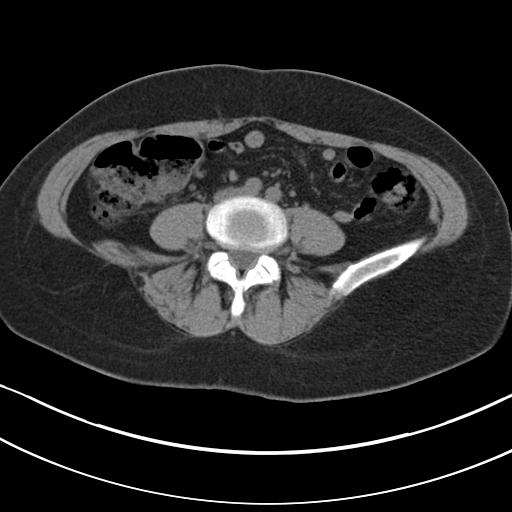
[im 51/101  soft-tissue]
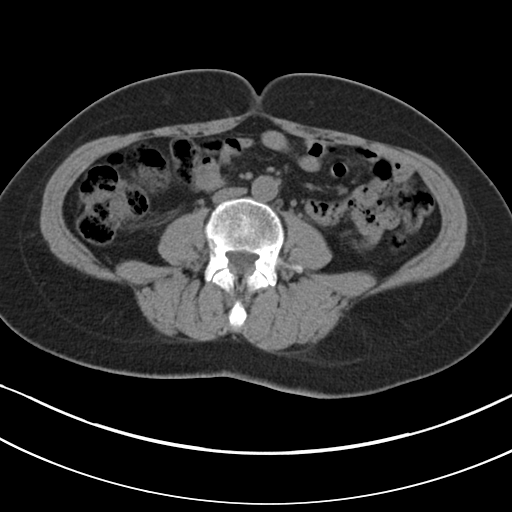
[im 56/101  soft-tissue]
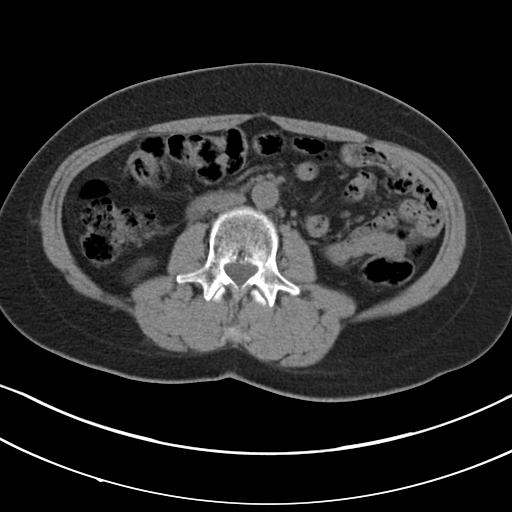
[im 66/101  soft-tissue]
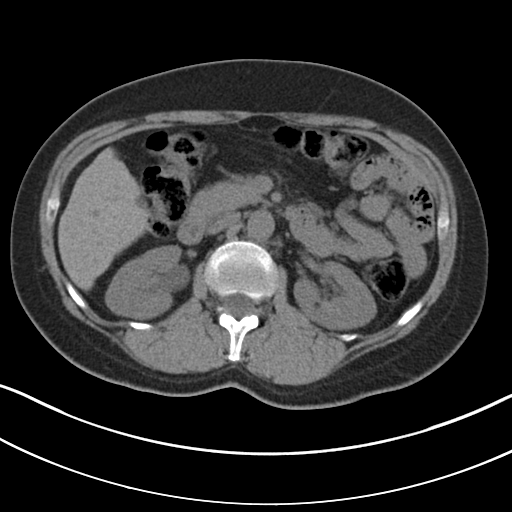
[im 66/101  bone]
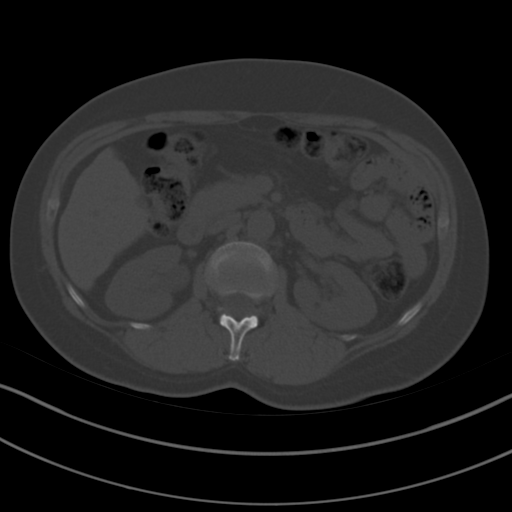
[im 71/101  soft-tissue]
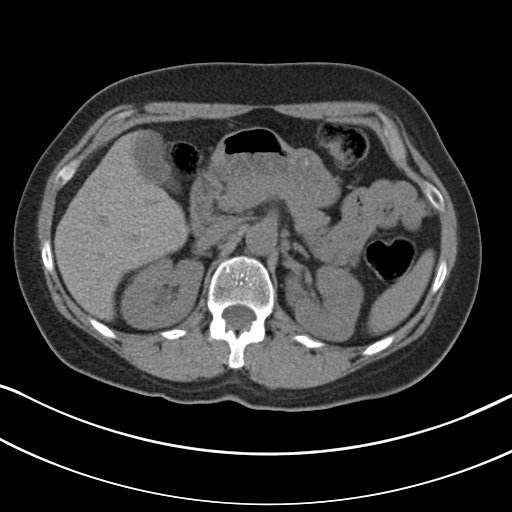
[im 81/101  soft-tissue]
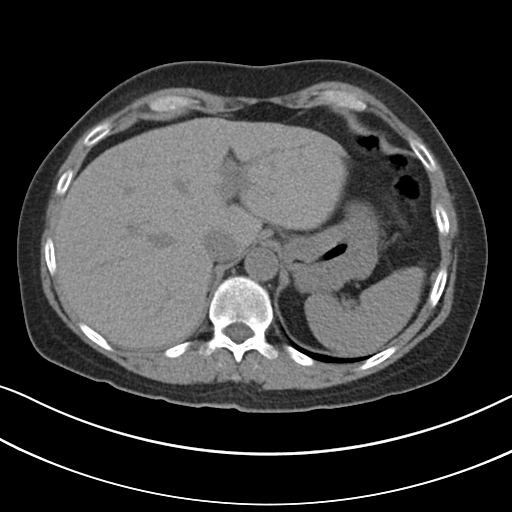
[im 86/101  soft-tissue]
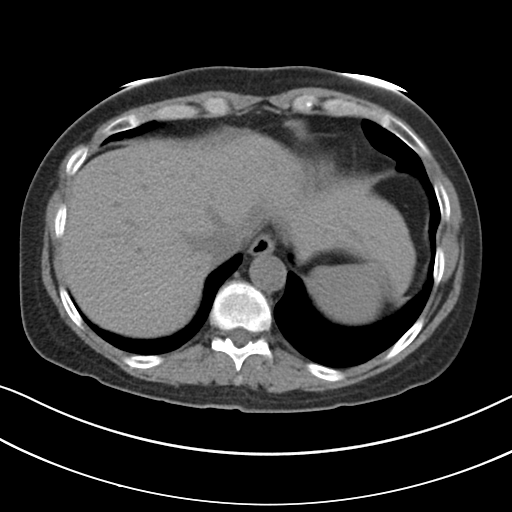
[im 96/101  soft-tissue]
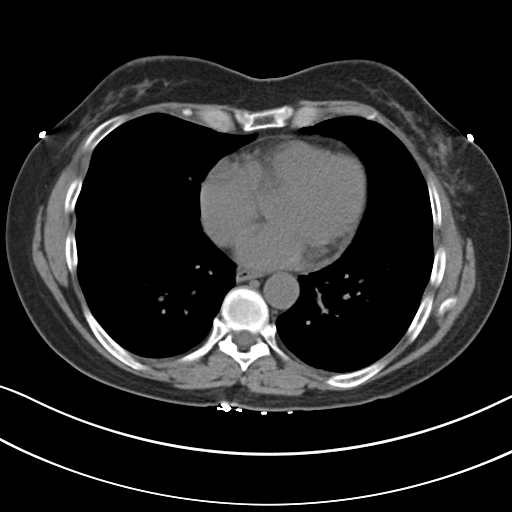

[Series 5: coronal · coronal · 0.80mm/px · 3 of 127 slices shown]
[im 43/127  soft-tissue]
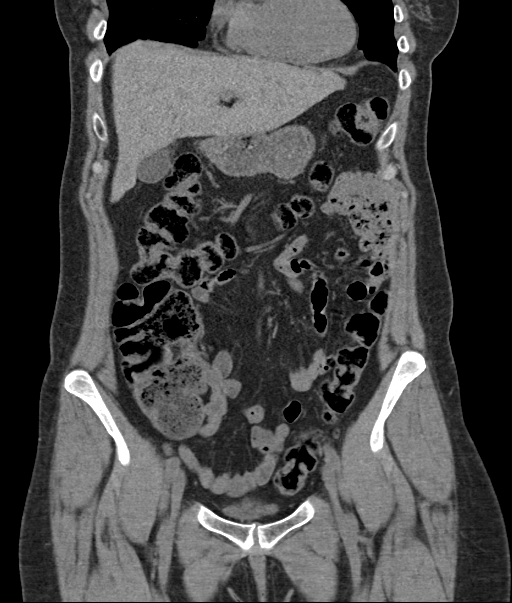
[im 57/127  soft-tissue]
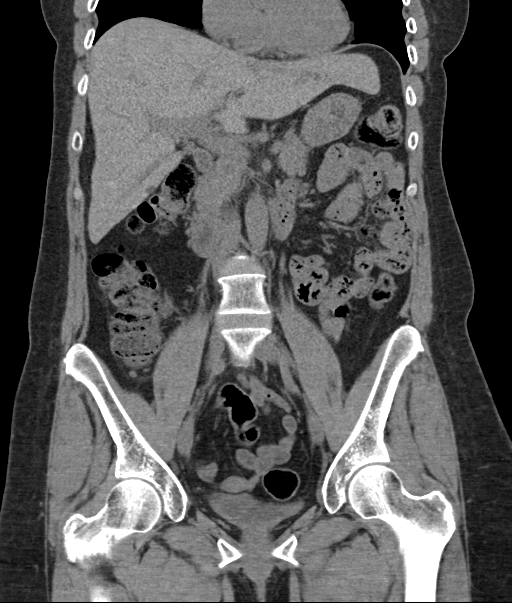
[im 71/127  soft-tissue]
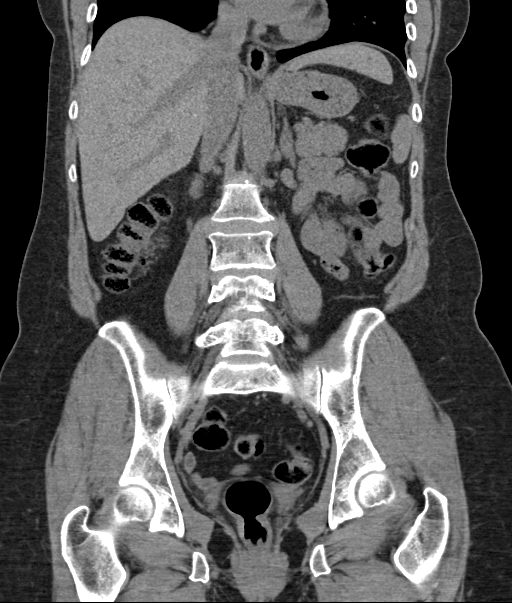

[16 of 46 positions shown; findings below may reference images not displayed]

FINDINGS: LOWER CHEST: There is no basilar pleural or apical pericardial
effusion.

HEPATOBILIARY: The hepatic contours and density are normal. There is
no intra- or extrahepatic biliary dilatation. The gallbladder is
normal.

PANCREAS: The pancreatic parenchymal contours are normal and there
is no ductal dilatation. There is no peripancreatic fluid
collection.

SPLEEN: Normal.

ADRENALS/URINARY TRACT:

--Adrenal glands: Normal.

--Right kidney/ureter: There is a 4 x 3 mm stone at the right
ureterovesical junction. There is mild right pelviectasis. No
hydroureteronephrosis or perinephric edema.

--Left kidney/ureter: No hydronephrosis, nephroureterolithiasis,
perinephric stranding or solid renal mass.

--Urinary bladder: Normal for degree of distention

STOMACH/BOWEL:

--Stomach/Duodenum: There is no hiatal hernia or other gastric
abnormality. The duodenal course and caliber are normal.

--Small bowel: No dilatation or inflammation.

--Colon: No focal abnormality.

--Appendix: Not visualized. No right lower quadrant inflammation or
free fluid.

VASCULAR/LYMPHATIC: Normal course and caliber of the major abdominal
vessels. No abdominal or pelvic lymphadenopathy.

REPRODUCTIVE: Status post hysterectomy. No adnexal mass.

MUSCULOSKELETAL. No bony spinal canal stenosis or focal osseous
abnormality.

OTHER: None.
IMPRESSION: 1. 4 x 3 mm stone at the right ureterovesical junction without
hydronephrosis or hydroureter.
2. Otherwise unremarkable CT of the abdomen and pelvis.

## 2019-02-11 DIAGNOSIS — H903 Sensorineural hearing loss, bilateral: Secondary | ICD-10-CM | POA: Diagnosis not present

## 2019-02-11 DIAGNOSIS — H8102 Meniere's disease, left ear: Secondary | ICD-10-CM | POA: Diagnosis not present

## 2019-02-11 DIAGNOSIS — H9312 Tinnitus, left ear: Secondary | ICD-10-CM | POA: Diagnosis not present

## 2019-02-28 DIAGNOSIS — Z0000 Encounter for general adult medical examination without abnormal findings: Secondary | ICD-10-CM | POA: Diagnosis not present

## 2019-02-28 DIAGNOSIS — R7309 Other abnormal glucose: Secondary | ICD-10-CM | POA: Diagnosis not present

## 2019-03-06 DIAGNOSIS — E7849 Other hyperlipidemia: Secondary | ICD-10-CM | POA: Diagnosis not present

## 2019-03-06 DIAGNOSIS — R413 Other amnesia: Secondary | ICD-10-CM | POA: Diagnosis not present

## 2019-03-06 DIAGNOSIS — F3289 Other specified depressive episodes: Secondary | ICD-10-CM | POA: Diagnosis not present

## 2019-03-06 DIAGNOSIS — F319 Bipolar disorder, unspecified: Secondary | ICD-10-CM | POA: Diagnosis not present

## 2019-03-06 DIAGNOSIS — Z1331 Encounter for screening for depression: Secondary | ICD-10-CM | POA: Diagnosis not present

## 2019-04-02 ENCOUNTER — Encounter: Payer: Self-pay | Admitting: Cardiology

## 2019-04-02 ENCOUNTER — Other Ambulatory Visit: Payer: Self-pay

## 2019-04-02 ENCOUNTER — Ambulatory Visit: Payer: BC Managed Care – PPO | Admitting: Cardiology

## 2019-04-02 VITALS — Ht 67.0 in | Wt 185.0 lb

## 2019-04-02 DIAGNOSIS — I209 Angina pectoris, unspecified: Secondary | ICD-10-CM | POA: Diagnosis not present

## 2019-04-02 DIAGNOSIS — R0609 Other forms of dyspnea: Secondary | ICD-10-CM | POA: Diagnosis not present

## 2019-04-02 DIAGNOSIS — Z8673 Personal history of transient ischemic attack (TIA), and cerebral infarction without residual deficits: Secondary | ICD-10-CM | POA: Diagnosis not present

## 2019-04-02 DIAGNOSIS — G43109 Migraine with aura, not intractable, without status migrainosus: Secondary | ICD-10-CM

## 2019-04-02 DIAGNOSIS — R0600 Dyspnea, unspecified: Secondary | ICD-10-CM

## 2019-04-02 DIAGNOSIS — E7800 Pure hypercholesterolemia, unspecified: Secondary | ICD-10-CM

## 2019-04-02 MED ORDER — NITROGLYCERIN 0.4 MG SL SUBL: 0.4000 mg | SUBLINGUAL_TABLET | SUBLINGUAL | 3 refills | Status: DC | PRN

## 2019-04-02 MED ORDER — ASPIRIN EC 81 MG PO TBEC: 81.0000 mg | DELAYED_RELEASE_TABLET | Freq: Every day | ORAL | 3 refills | Status: DC

## 2019-04-02 NOTE — Progress Notes (Signed)
Subjective:   Natalie Le, female    DOB: June 15, 1961, 58 y.o.   MRN: [id number]  No chief complaint on file.  This visit type was conducted due to national recommendations for restrictions regarding the COVID-19 Pandemic (e.g. social distancing).  This format is felt to be most appropriate for this patient at this time.  All issues noted in this document were discussed and addressed.  No physical exam was performed (except for noted visual exam findings with Telehealth visits).  The patient has consented to conduct a Telehealth visit and understands insurance will be billed.   I discussed the limitations of evaluation and management by telemedicine and the availability of in person appointments. The patient expressed understanding and agreed to proceed.  Virtual Visit via Video Note is as below  I connected with Ms. Hovater, on 04/02/19 at 1130 by a video enabled telemedicine application and verified that I am speaking with the correct person using two identifiers.     I have discussed with her regarding the safety during COVID Pandemic and steps and precautions including social distancing with the patient.    Patient referred by Leanna Battles for chest pain  HPI:  Natalie Le is a 58 y.o.  with OSA on CPAP, migraines, hiatal hernia, history of upper GI bleed in July 2019, family history of CAD, questionable history of TIA in 2016, hyperlipidemia, depression, and bipolar disorder, referred to [place] for chest pain.  Patient reports symptoms started 6 months ago, but have recently worsened. She has intermittent episodes of chest discomfort that she attributes to gas; however, she also has episodes of chest pain with exertion that occasionally goes to her shoulder and associated with shortness of breath. Improves with resting. She can also have chest pain at rest. Also has pain in the middle of her back on occasion that she is unsure if related to kidney stones as she has history  of this. She does notice dyspnea on exertion with walking back and forth to her horse barn. Does admit to gaining weight over the last several months related to steroid use for her decreased hearing in her left ear that is managed by Dr. Cresenciano Lick.   She has chronic right ear hearing loss from 2016. In 2016, she had an episode of slurred speech. MRI and MRA of brain was negative. Echocardiogram with bubble study was recommended; however, she did not have performed due to cost.   She does have history of migraines that have recently worsened. Does have aura on occasion with these. Denies any history of hypertension, diabetes, or thyroid disorders. No former tobacco use. She was previously on pravastatin; however, due to concern about developing Alzheimer's, she did not take, but is now agreeable to taking Crestor that was started by PCP.    She does not exercise, but is fairly active with taking care of her horses. Does mention having difficulty getting out of the bed and not wanting to do things. She admits to fatigue lately that she attributes to her depression and also to her only just recently resuming CPAP.   Past Medical History:  Diagnosis Date   ADD (attention deficit disorder)    Allergy    takes Allegra D daily prn allergies and Flonase prn allergies   Anxiety    takes Xanax prn anxiety   Arthritis    Bipolar 2 disorder (HCC)    takes Adderall daily   Chronic deafness in right ear  Chronic low back pain    Depression    takes Prozac daily   Dizziness    Hearing impaired    deaf in right ear   Hiatal hernia    History of fracture of nasal bone 07/01/2007   History of migraine    last one on 10/09/13   IBS (irritable bowel syndrome)    Insomnia    takes Trazodone nightly prn sleep   Laryngitis    LVH (left ventricular hypertrophy) 04/05/2015   Mild, Noted on ECHO   Muscle spasms of neck    takes Flexeril daily prn muscle spasms   OSA on CPAP 05/14/2014    resolved after weight loss   Pleurisy    when in college   Pneumonia    walking pneumonia in college   PONV (postoperative nausea and vomiting)    Right ureteral stone    4 x 3 mm stone at the right ureterovesical junction    Shortness of breath    with exertion/sitting   TIA (transient ischemic attack) 03/2015   Toe fracture, right 04/2017   great toe   Urinary frequency    Viral infection    09/25/13-was given an inhaler d/t SOB;was on ZPak   Weakness    both hands    Past Surgical History:  Procedure Laterality Date   ABDOMINAL HERNIA REPAIR     ABDOMINAL HYSTERECTOMY     bladder  stretch     BUNIONECTOMY     COCHLEAR IMPLANT Right    COLONOSCOPY     cyst removed from left arm      as a child   ELBOW FRACTURE SURGERY Right    fx nose repair     NECK SURGERY  12/2015   tumor removed from left breast  3yr ago   UPPER GASTROINTESTINAL ENDOSCOPY      Family History  Problem Relation Age of Onset   Colon cancer Maternal Grandfather    Hypertension Maternal Grandfather    Hyperlipidemia Maternal Grandfather    Diabetes Maternal Grandfather    Alzheimer's disease Mother    Hypertension Mother    Diabetes Mother    Hypertension Father    Heart Problems Father        quadruple bypass   Hyperlipidemia Father    Diabetes Sister    Hypertension Sister    Hyperlipidemia Sister    Hyperlipidemia Brother    Hypertension Brother    Anuerysm Maternal Grandmother    Diabetes Paternal Grandmother    Hypertension Paternal Grandmother    Hyperlipidemia Paternal Grandmother    Lung cancer Paternal Grandfather     Social History   Socioeconomic History   Marital status: Single    Spouse name: Not on file   Number of children: 0   Years of education: college   Highest education level: Not on file  Occupational History    Employer: [employer]    Comment: SEnvironmental consultantstrain: Not on  file   Food insecurity:    Worry: Not on file    Inability: Not on file   Transportation needs:    Medical: Not on file    Non-medical: Not on file  Tobacco Use   Smoking status: Never Smoker   Smokeless tobacco: Never Used  Substance and Sexual Activity   Alcohol use: Yes    Alcohol/week: 1.0 standard drinks    Types: 1 Glasses of wine per week    Comment: rarely  Drug use: No   Sexual activity: Yes    Birth control/protection: Surgical  Lifestyle   Physical activity:    Days per week: Not on file    Minutes per session: Not on file   Stress: Not on file  Relationships   Social connections:    Talks on phone: Not on file    Gets together: Not on file    Attends religious service: Not on file    Active member of club or organization: Not on file    Attends meetings of clubs or organizations: Not on file    Relationship status: Not on file   Intimate partner violence:    Fear of current or ex partner: Not on file    Emotionally abused: Not on file    Physically abused: Not on file    Forced sexual activity: Not on file  Other Topics Concern   Not on file  Social History Narrative   Patient lives at home with her boyfriend.   Patient works full time Press photographer.   Education college degree   Right handed   Caffeine one cup of coffee daily sometimes tea.    Current Outpatient Medications on File Prior to Visit  Medication Sig Dispense Refill   ALPRAZolam (XANAX) 0.5 MG tablet Take 0.5-0.75 mg by mouth 2 (two) times daily as needed for anxiety or sleep (panic attacks).   1   amphetamine-dextroamphetamine (ADDERALL) 20 MG tablet Take 20 mg by mouth 2 (two) times daily.      cephALEXin (KEFLEX) 500 MG capsule Take 1 capsule (500 mg total) by mouth 3 (three) times daily. 30 capsule 0   dicyclomine (BENTYL) 10 MG capsule Take 20 mg by mouth 3 (three) times daily as needed for spasms.   1   estazolam (PROSOM) 2 MG tablet Take 2 mg by mouth at bedtime.       FLUoxetine (PROZAC) 40 MG capsule Take 60 mg by mouth daily.   1   ondansetron (ZOFRAN) 4 MG tablet Take 1 tablet (4 mg total) by mouth every 8 (eight) hours as needed for nausea or vomiting. 6 tablet 0   oxyCODONE-acetaminophen (PERCOCET/ROXICET) 5-325 MG tablet Take 1 tablet by mouth every 6 (six) hours as needed for moderate pain or severe pain. 10 tablet 0   pantoprazole (PROTONIX) 40 MG tablet Take 40 mg by mouth 2 (two) times daily.  1   pravastatin (PRAVACHOL) 40 MG tablet Take 20 mg by mouth every other day.   2   pseudoephedrine-acetaminophen (TYLENOL SINUS) 30-500 MG TABS tablet Take 1 tablet by mouth every 4 (four) hours as needed. Congestion     tamsulosin (FLOMAX) 0.4 MG CAPS capsule Take 1 po QD until you pass the stone. 10 capsule 0   traZODone (DESYREL) 150 MG tablet Take 75-150 mg by mouth at bedtime as needed for sleep.   1   No current facility-administered medications on file prior to visit.      Review of Systems  Constitution: Positive for malaise/fatigue and weight gain. Negative for decreased appetite and weight loss.  Eyes: Negative for visual disturbance.  Cardiovascular: Positive for chest pain, dyspnea on exertion and leg swelling. Negative for claudication, orthopnea, palpitations and syncope.  Respiratory: Negative for hemoptysis and wheezing.   Endocrine: Negative for cold intolerance and heat intolerance.  Hematologic/Lymphatic: Does not bruise/bleed easily.  Skin: Negative for nail changes.  Musculoskeletal: Negative for muscle weakness and myalgias.  Gastrointestinal: Negative for abdominal pain, change in bowel habit,  nausea and vomiting.  Neurological: Positive for headaches. Negative for difficulty with concentration, dizziness and focal weakness.  Psychiatric/Behavioral: Negative for altered mental status and suicidal ideas.  All other systems reviewed and are negative.      Objective:     Last menstrual period 12/18/2005.  Physical Exam    Constitutional: She appears healthy. No distress.  Neck: No JVD present.  Pulmonary/Chest: Effort normal.  Neurological: She is alert and oriented to person, place, and time.    Cardiac studies:   Echo 04/05/2015: Left ventricle: The cavity size was normal. Wall thickness was increased in a pattern of mild LVH. Systolic function was normal. The estimated ejection fraction was in the range of 60% to 65%. Wall motion was normal; there were no regional wall motion abnormalities. - Right ventricle: The cavity size was normal. Systolic function was normal. - Impressions: No cardiac source of embolism was identified, but cannot be ruled out on the basis of this examination.  Recent Labs:  02/28/2019: HgbA1c 5.4%. Creatinine 0.8, eGFR 73/89, potassium 4.7, CMP normal. CBC normal. Cholesterol 242, triglycerides 83, HDL 58, LDL 167.   Radiology:   MRI 04/19/2016: 1. Single prominent subcortical T2 hyperintensity in the right frontal operculum. The finding is nonspecific but can be seen in the setting of chronic microvascular ischemia, a demyelinating process such as multiple sclerosis, vasculitis, complicated migraine headaches, or as the sequelae of a prior infectious or inflammatory process. 2. Otherwise normal MRI of the brain for age.     Assessment & Recommendations:   1. Angina pectoris ([place]) While she does have some episodes of chest discomfort that are atypical, she also has symptoms with exertion with radiation to her shoulder that is concerning for angina. In view of her risk factors and concerning symptoms, will schedule for exercise nuclear stress test for further evaluation. She is currently taking 81 mg ASA daily in which I have encouraged her to continue until further workup is complete. I will also send in nitroglycerin for her to use as needed. Instructions on how to use were given as well as ER precautions.  2. Dyspnea on exertion Likely multifactorial  from weight gain, inactivity, and possible cardiac etiology. Will obtain echocardiogram to exclude any structural abnormalities. She does have some intermittent leg swelling. She is working to make diet changes to help with weight loss, which I have encouraged her to do to also help with this.  3. History of TIA (transient ischemic attack) Had possible TIA in 2016 with slurred speech and hearing loss. In view of her history of migraines, she may have PFO. I have recommended that we perform bubble study with her echo for further evaluation. Continue with statin and ASA. It does not appear that she has had evaluation for A fib as well. She does have risk factors for this with OSA. May consider evaluation depending upon echo results.   4. Migraine with aura and without status migrainosus, not intractable Has had recent worsening of migraines. Managed by PCP.  5. Hypercholesteremia Lipids were markedly elevated and is now on Crestor 37m daily that she is tolerating well. Would recommend repeating lipids in the next few weeks. She may need addition of Zeita. Diet changes will also help with this.   Recommendations: I am concerned about patients symptoms in view of her risk factors, will obtain above recommended test in the next few weeks and see her back after this for follow up. Encouraged her to contact me sooner for any questions  or concerns.    Thank you for referring this patient. Please do not hesitate to contact me for any questions.   Jeri Lager, MSN, APRN, FNP-C [facility], [place] Office: [phone] Fax: [phone]

## 2019-04-11 DIAGNOSIS — K625 Hemorrhage of anus and rectum: Secondary | ICD-10-CM | POA: Diagnosis not present

## 2019-04-11 DIAGNOSIS — K2210 Ulcer of esophagus without bleeding: Secondary | ICD-10-CM | POA: Diagnosis not present

## 2019-04-11 DIAGNOSIS — F419 Anxiety disorder, unspecified: Secondary | ICD-10-CM | POA: Diagnosis not present

## 2019-04-11 DIAGNOSIS — K582 Mixed irritable bowel syndrome: Secondary | ICD-10-CM | POA: Diagnosis not present

## 2019-05-16 ENCOUNTER — Other Ambulatory Visit: Payer: Self-pay

## 2019-05-16 ENCOUNTER — Ambulatory Visit (INDEPENDENT_AMBULATORY_CARE_PROVIDER_SITE_OTHER): Payer: BC Managed Care – PPO

## 2019-05-16 DIAGNOSIS — I209 Angina pectoris, unspecified: Secondary | ICD-10-CM

## 2019-05-19 ENCOUNTER — Ambulatory Visit (INDEPENDENT_AMBULATORY_CARE_PROVIDER_SITE_OTHER): Payer: BC Managed Care – PPO

## 2019-05-19 ENCOUNTER — Other Ambulatory Visit: Payer: Self-pay

## 2019-05-19 DIAGNOSIS — I209 Angina pectoris, unspecified: Secondary | ICD-10-CM | POA: Diagnosis not present

## 2019-05-19 DIAGNOSIS — Z8673 Personal history of transient ischemic attack (TIA), and cerebral infarction without residual deficits: Secondary | ICD-10-CM | POA: Diagnosis not present

## 2019-05-26 ENCOUNTER — Other Ambulatory Visit: Payer: Self-pay

## 2019-05-26 ENCOUNTER — Ambulatory Visit (INDEPENDENT_AMBULATORY_CARE_PROVIDER_SITE_OTHER): Payer: BC Managed Care – PPO | Admitting: Cardiology

## 2019-05-26 ENCOUNTER — Encounter: Payer: Self-pay | Admitting: Cardiology

## 2019-05-26 VITALS — BP 124/75 | HR 77 | Ht 67.0 in | Wt 188.0 lb

## 2019-05-26 DIAGNOSIS — G43109 Migraine with aura, not intractable, without status migrainosus: Secondary | ICD-10-CM

## 2019-05-26 DIAGNOSIS — E7800 Pure hypercholesterolemia, unspecified: Secondary | ICD-10-CM

## 2019-05-26 DIAGNOSIS — I209 Angina pectoris, unspecified: Secondary | ICD-10-CM | POA: Diagnosis not present

## 2019-05-26 DIAGNOSIS — G4733 Obstructive sleep apnea (adult) (pediatric): Secondary | ICD-10-CM

## 2019-05-26 DIAGNOSIS — Z8673 Personal history of transient ischemic attack (TIA), and cerebral infarction without residual deficits: Secondary | ICD-10-CM | POA: Diagnosis not present

## 2019-05-26 DIAGNOSIS — R0609 Other forms of dyspnea: Secondary | ICD-10-CM | POA: Diagnosis not present

## 2019-05-26 DIAGNOSIS — R0600 Dyspnea, unspecified: Secondary | ICD-10-CM

## 2019-05-26 MED ORDER — METOPROLOL SUCCINATE ER 25 MG PO TB24: 25.0000 mg | ORAL_TABLET | Freq: Every day | ORAL | 1 refills | Status: DC

## 2019-05-26 NOTE — H&P (View-Only) (Signed)
Primary Physician:  Leanna Battles, MD   [name], female    DOB: [birth year], 58 y.o.   MRN: [id number]  Subjective:    Chief Complaint  Patient presents with  . Follow-up  . Chest Pain    PECTORIS (  . Shortness of Breath    HPI: Natalie Le  is a 58 y.o. female  with OSA on CPAP, migraines, hiatal hernia, history of upper GI bleed in July 2019, family history of CAD, questionable history of TIA in 2016, hyperlipidemia, depression, and bipolar disorder, recently evaluated by [place] for angina. She underwent echo and exercise nuclear stress test and now presents to discuss results.   Patient has had continued episodes of chest discomfort that radiates to her back associated with shortness of breath that can occur both with exertion or at rest. She has used nitroglycerin twice that did help her chest discomfort after a minute or two. She doesn't have chest pain every time she exerts herself, but is more frequent over the last few months. She does get short of breath with minimal activities such as walking back from her horse barn or walking up hill. She was also previously able to stack 15-20 bales of hay, but over the last 1 month is only able to stack 3 bales of hay before she has to rest.   Denies any history of hypertension, diabetes, or thyroid disorders. No former tobacco use. Does have hyperlipidemia that is uncontrolled as she had previously not been taking her statin regularly. She also has OSA, but does not use CPAP regularly.She has chronic right ear hearing loss from 2016. In 2016, she had an episode of slurred speech felt to be related to TIA. MRI and MRA of brain was negative.   She does not exercise, but is fairly active with taking care of her horses. Does mention having difficulty getting out of the bed and not wanting to do things.   Past Medical History:  Diagnosis Date  . ADD (attention deficit disorder)   . Allergy    takes Allegra D daily prn  allergies and Flonase prn allergies  . Anxiety    takes Xanax prn anxiety  . Arthritis   . Bipolar 2 disorder ([place])    takes Adderall daily  . 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 Chronic low back pain   . Depression    takes Prozac daily  . Dizziness   . Hearing impaired    deaf in right ear  . Hiatal hernia   . History of fracture of nasal bone 07/01/2007  . History of migraine    last one on 10/09/13  . Hyperlipidemia   . IBS (irritable bowel syndrome)   . Insomnia    takes Trazodone nightly prn sleep  . Laryngitis   . LVH (left ventricular hypertrophy) 04/05/2015   Mild, Noted on ECHO  . Muscle spasms of neck    takes Flexeril daily prn muscle spasms  . OSA on CPAP 05/14/2014   resolved after weight loss  . Pleurisy    when in college  . Pneumonia    walking pneumonia in college  . PONV (postoperative nausea and vomiting)   . Right ureteral stone    4 x 3 mm stone at the right ureterovesical junction   . Shortness of breath    with exertion/sitting  . TIA (transient ischemic attack) 03/2015  . Toe fracture, right 04/2017   great toe  .  Urinary frequency   . Viral infection    09/25/13-was given an inhaler d/t SOB;was on ZPak  . Weakness    both hands    Past Surgical History:  Procedure Laterality Date  . ABDOMINAL HERNIA REPAIR    . ABDOMINAL HYSTERECTOMY    . bladder  stretch    . BUNIONECTOMY    . COCHLEAR IMPLANT Right   . COLONOSCOPY    . cyst removed from left arm      as a child  . ELBOW FRACTURE SURGERY Right   . fx nose repair    . NECK SURGERY  12/2015  . tumor removed from left breast  29yr ago  . UPPER GASTROINTESTINAL ENDOSCOPY      Social History   Socioeconomic History  . Marital status: Single    Spouse name: Not on file  . Number of children: 0  . Years of education: college  . 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 Financial resource  strain: Not on file  . Food insecurity:    Worry: Not on file    Inability: Not on file  . Transportation needs:    Medical: Not on file    Non-medical: Not on file  Tobacco Use  . Smoking status: Never Smoker  . Smokeless tobacco: Never Used  Substance and Sexual Activity  . Alcohol use: Yes    Alcohol/week: 1.0 standard drinks    Types: 1 Glasses of wine per week    Comment: rarely  . Drug use: No  . Sexual activity: Yes    Birth control/protection: Surgical  Lifestyle  . Physical activity:    Days per week: Not on file    Minutes per session: Not on file  . Stress: Not on file  Relationships  . Social connections:    Talks on phone: Not on file    Gets together: Not on file    Attends religious service: Not on file    Active member of club or organization: Not on file    Attends meetings of clubs or organizations: Not on file    Relationship status: Not on file  . Intimate partner violence:    Fear of current or ex partner: Not on file    Emotionally abused: Not on file    Physically abused: Not on file    Forced sexual activity: Not on file  Other Topics Concern  . Not on file  Social History Narrative   Patient lives at home with her boyfriend.   Patient works full time sPress photographer   Education college degree   Right handed   Caffeine one cup of coffee daily sometimes tea.    Review of Systems  Constitution: Positive for malaise/fatigue and weight gain. Negative for decreased appetite and weight loss.  Eyes: Negative for visual disturbance.  Cardiovascular: Positive for chest pain, dyspnea on exertion and leg swelling. Negative for claudication, orthopnea, palpitations and syncope.  Respiratory: Negative for hemoptysis and wheezing.   Endocrine: Negative for cold intolerance and heat intolerance.  Hematologic/Lymphatic: Does not bruise/bleed easily.  Skin: Negative for nail changes.  Musculoskeletal: Negative for muscle weakness and myalgias.  Gastrointestinal:  Negative for abdominal pain, change in bowel habit, nausea and vomiting.  Neurological: Positive for headaches. Negative for difficulty with concentration, dizziness and focal weakness.  Psychiatric/Behavioral: Negative for altered mental status and suicidal ideas.  All other systems  reviewed and are negative.     Objective:  Blood pressure 124/75, pulse 77, height 5' 7"  (1.702 m), weight 188 lb (85.3 kg), last menstrual period 12/18/2005, SpO2 98 %. Body mass index is 29.44 kg/m.    Physical Exam  Constitutional: She is oriented to person, place, and time. Vital signs are normal. She appears well-developed and well-nourished.  HENT:  Head: Normocephalic and atraumatic.  Neck: Normal range of motion.  Cardiovascular: Normal rate, regular rhythm, normal heart sounds and intact distal pulses.  Pulmonary/Chest: Effort normal and breath sounds normal. No accessory muscle usage. No respiratory distress.  Abdominal: Soft. Bowel sounds are normal.  Musculoskeletal: Normal range of motion.  Neurological: She is alert and oriented to person, place, and time.  Skin: Skin is warm and dry.  Vitals reviewed.  Radiology: No results found.  Laboratory examination:   02/28/2019: HgbA1c 5.4%. Creatinine 0.8, eGFR 73/89, potassium 4.7, CMP normal. CBC normal. Cholesterol 242, triglycerides 83, HDL 58, LDL 167.   CMP Latest Ref Rng & Units 08/26/2018 12/19/2016 04/04/2015  Glucose 70 - 99 mg/dL 112(H) 97 102(H)  BUN 6 - 20 mg/dL 20 15 17   Creatinine 0.44 - 1.00 mg/dL 0.85 0.76 0.99  Sodium 135 - 145 mmol/L 141 135 136  Potassium 3.5 - 5.1 mmol/L 4.4 3.6 3.7  Chloride 98 - 111 mmol/L 107 102 104  CO2 22 - 32 mmol/L 26 26 20   Calcium 8.9 - 10.3 mg/dL 9.1 9.4 9.3  Total Protein 6.5 - 8.1 g/dL 6.7 7.1 6.8  Total Bilirubin 0.3 - 1.2 mg/dL 0.6 0.9 1.6(H)  Alkaline Phos 38 - 126 U/L 65 75 73  AST 15 - 41 U/L 13(L) 17 19  ALT 0 - 44 U/L 13 17 14    CBC Latest Ref Rng & Units 08/26/2018 12/19/2016 04/04/2015   WBC 4.0 - 10.5 K/uL 12.8(H) 7.3 6.3  Hemoglobin 12.0 - 15.0 g/dL 13.2 12.3 12.5  Hematocrit 36.0 - 46.0 % 39.7 37.9 37.1  Platelets 150 - 400 K/uL 244 257 246   Lipid Panel     Component Value Date/Time   CHOL 188 04/05/2015 0746   TRIG 36 04/05/2015 0746   HDL 62 04/05/2015 0746   CHOLHDL 3.0 04/05/2015 0746   VLDL 7 04/05/2015 0746   LDLCALC 119 (H) 04/05/2015 0746   HEMOGLOBIN A1C Lab Results  Component Value Date   HGBA1C 5.7 (H) 04/05/2015   MPG 117 04/05/2015   TSH No results for input(s): TSH in the last 8760 hours.  PRN Meds:. Medications Discontinued During This Encounter  Medication Reason  . rosuvastatin (CRESTOR) 20 MG tablet Error   Current Meds  Medication Sig  . ALPRAZolam (XANAX) 0.5 MG tablet Take 0.5-0.75 mg by mouth 2 (two) times daily as needed for anxiety or sleep (panic attacks).   Marland Kitchen amphetamine-dextroamphetamine (ADDERALL) 20 MG tablet Take 20 mg by mouth daily.   Marland Kitchen aspirin EC 81 MG tablet Take 1 tablet (81 mg total) by mouth daily.  Marland Kitchen aspirin-acetaminophen-caffeine (EXCEDRIN MIGRAINE) 250-250-65 MG tablet Take by mouth as needed.  . dicyclomine (BENTYL) 10 MG capsule Take 20 mg by mouth 3 (three) times daily as needed for spasms.   Marland Kitchen estazolam (PROSOM) 2 MG tablet Take 1 mg by mouth at bedtime.   Marland Kitchen FLUoxetine (PROZAC) 40 MG capsule Take 60 mg by mouth daily.   . fluticasone (FLONASE) 50 MCG/ACT nasal spray as needed.  . nitroGLYCERIN (NITROSTAT) 0.4 MG SL tablet Place 1 tablet (0.4 mg total) under the  tongue every 5 (five) minutes as needed for chest pain.  Marland Kitchen ondansetron (ZOFRAN) 4 MG tablet Take 1 tablet (4 mg total) by mouth every 8 (eight) hours as needed for nausea or vomiting.  . pantoprazole (PROTONIX) 40 MG tablet Take 40 mg by mouth as needed.   . pseudoephedrine-acetaminophen (TYLENOL SINUS) 30-500 MG TABS tablet Take 1 tablet by mouth every 4 (four) hours as needed. Congestion  . tamsulosin (FLOMAX) 0.4 MG CAPS capsule Take 1 po QD until  you pass the stone.  . traZODone (DESYREL) 150 MG tablet Take 75-150 mg by mouth at bedtime as needed for sleep.     Cardiac Studies:   Echocardiogram 05/19/2019 :  Normal LV systolic function with EF 55%. Left ventricle cavity is normal in size. Mild to moderate concentric hypertrophy of the left ventricle. Normal global wall motion.  E/A 0.82. Diastolic function not completely assessed.  Calculated EF 55%. Left atrial cavity is normal in size. Interatrial septum is intact. Double contrast study negative for intra-cardiac shunt. No late appearance of bubbles also. Mild tricuspid regurgitation. No evidence of pulmonary hypertension.  Exercise Myoview stress test 05/16/2019: Exercise treadmill stress test was performed using Bruce protocol. Patient walked for 7.04 min, achieving 8.6 METS and a maximum heart rate of 138 beats per minute, which is 86% of maximum predicted heart rate. Hemodynamic response was normal. Exercise capacity was normal. No ischemic changes noted on stress electrocardiogram. No sustained stress arrhythmias were noted. Myocardial pefusion imaging is normal. Left ventricular ejection fraction is 72% with normal wall motion. Low risk study.   Assessment:   Angina pectoris ([place]) - Plan: EKG 12-Lead  Dyspnea on exertion - Plan: EKG 12-Lead  History of TIA (transient ischemic attack) - Plan: EKG 12-Lead  Hypercholesteremia - Plan: EKG 12-Lead  Migraine with aura and without status migrainosus, not intractable - Plan: EKG 12-Lead  OSA (obstructive sleep apnea)  EKG 05/26/2019: Normal sinus rhythm at 78 bpm, left atrial enlargement, normal axis, no evidence of ischemia.   Recommendations:   I have discussed recently obtained stress test and echocardiogram results with the patient. No evidence of ischemia and good exercise tolerance on stress testing; however, she continues to have concerning symptoms of angina that are nitroglycerin responsive.  In view of her family  history of early CAD, uncontrolled hyperlipidemia, history of TIA, and concerning symptoms, feel that patient should have further cardiac evaluation. I have discussed options including risk/benefits of Coronary CTA vs. Coronary angiogram. Patient is concerned about her symptoms and feels that something isn't right, she wishes to proceed with coronary angiogram. Schedule for cardiac catheterization, and possible angioplasty. We discussed regarding risks, benefits, alternatives to this including stress testing, CTA and continued medical therapy. Patient wants to proceed. Understands <1-2% risk of death, stroke, MI, urgent CABG, bleeding, infection, renal failure but not limited to these. I will start her on metoprolol succinate 25 mg daily for anti-anginal therapy. Advised her to continue with nitroglycerin as needed.   She is now taking Crestor regularly, encouraged her to continue with this and to also use CPAP daily. Her dyspnea may also be contributed by uncontrolled OSA and weight gain. I will see her back after the procedure for further recommendations and reevaluation.    Miquel Dunn, MSN, APRN, FNP-C [facility]. [place] Office: [phone] Fax: [phone]

## 2019-05-26 NOTE — Progress Notes (Signed)
Primary Physician:  Leanna Battles, MD   Patient ID: Janie Morning, female    DOB: November 16, 1961, 58 y.o.   MRN: [id number]  Subjective:    Chief Complaint  Patient presents with  . Follow-up  . Chest Pain    PECTORIS (  . Shortness of Breath    HPI: LUA FENG  is a 58 y.o. female  with OSA on CPAP, migraines, hiatal hernia, history of upper GI bleed in July 2019, family history of CAD, questionable history of TIA in 2016, hyperlipidemia, depression, and bipolar disorder, recently evaluated by [place] for angina. She underwent echo and exercise nuclear stress test and now presents to discuss results.   Patient has had continued episodes of chest discomfort that radiates to her back associated with shortness of breath that can occur both with exertion or at rest. She has used nitroglycerin twice that did help her chest discomfort after a minute or two. She doesn't have chest pain every time she exerts herself, but is more frequent over the last few months. She does get short of breath with minimal activities such as walking back from her horse barn or walking up hill. She was also previously able to stack 15-20 bales of hay, but over the last 1 month is only able to stack 3 bales of hay before she has to rest.   Denies any history of hypertension, diabetes, or thyroid disorders. No former tobacco use. Does have hyperlipidemia that is uncontrolled as she had previously not been taking her statin regularly. She also has OSA, but does not use CPAP regularly.She has chronic right ear hearing loss from 2016. In 2016, she had an episode of slurred speech felt to be related to TIA. MRI and MRA of brain was negative.   She does not exercise, but is fairly active with taking care of her horses. Does mention having difficulty getting out of the bed and not wanting to do things.   Past Medical History:  Diagnosis Date  . ADD (attention deficit disorder)   . Allergy    takes Allegra D daily prn  allergies and Flonase prn allergies  . Anxiety    takes Xanax prn anxiety  . Arthritis   . Bipolar 2 disorder ([place])    takes Adderall daily  . 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 Chronic low back pain   . Depression    takes Prozac daily  . Dizziness   . Hearing impaired    deaf in right ear  . Hiatal hernia   . History of fracture of nasal bone 07/01/2007  . History of migraine    last one on 10/09/13  . Hyperlipidemia   . IBS (irritable bowel syndrome)   . Insomnia    takes Trazodone nightly prn sleep  . Laryngitis   . LVH (left ventricular hypertrophy) 04/05/2015   Mild, Noted on ECHO  . Muscle spasms of neck    takes Flexeril daily prn muscle spasms  . OSA on CPAP 05/14/2014   resolved after weight loss  . Pleurisy    when in college  . Pneumonia    walking pneumonia in college  . PONV (postoperative nausea and vomiting)   . Right ureteral stone    4 x 3 mm stone at the right ureterovesical junction   . Shortness of breath    with exertion/sitting  . TIA (transient ischemic attack) 03/2015  . Toe fracture, right 04/2017   great toe  .  Urinary frequency   . Viral infection    09/25/13-was given an inhaler d/t SOB;was on ZPak  . Weakness    both hands    Past Surgical History:  Procedure Laterality Date  . ABDOMINAL HERNIA REPAIR    . ABDOMINAL HYSTERECTOMY    . bladder  stretch    . BUNIONECTOMY    . COCHLEAR IMPLANT Right   . COLONOSCOPY    . cyst removed from left arm      as a child  . ELBOW FRACTURE SURGERY Right   . fx nose repair    . NECK SURGERY  12/2015  . tumor removed from left breast  65yr ago  . UPPER GASTROINTESTINAL ENDOSCOPY      Social History   Socioeconomic History  . Marital status: Single    Spouse name: Not on file  . Number of children: 0  . Years of education: college  . 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 Financial resource  strain: Not on file  . Food insecurity:    Worry: Not on file    Inability: Not on file  . Transportation needs:    Medical: Not on file    Non-medical: Not on file  Tobacco Use  . Smoking status: Never Smoker  . Smokeless tobacco: Never Used  Substance and Sexual Activity  . Alcohol use: Yes    Alcohol/week: 1.0 standard drinks    Types: 1 Glasses of wine per week    Comment: rarely  . Drug use: No  . Sexual activity: Yes    Birth control/protection: Surgical  Lifestyle  . Physical activity:    Days per week: Not on file    Minutes per session: Not on file  . Stress: Not on file  Relationships  . Social connections:    Talks on phone: Not on file    Gets together: Not on file    Attends religious service: Not on file    Active member of club or organization: Not on file    Attends meetings of clubs or organizations: Not on file    Relationship status: Not on file  . Intimate partner violence:    Fear of current or ex partner: Not on file    Emotionally abused: Not on file    Physically abused: Not on file    Forced sexual activity: Not on file  Other Topics Concern  . Not on file  Social History Narrative   Patient lives at home with her boyfriend.   Patient works full time sPress photographer   Education college degree   Right handed   Caffeine one cup of coffee daily sometimes tea.    Review of Systems  Constitution: Positive for malaise/fatigue and weight gain. Negative for decreased appetite and weight loss.  Eyes: Negative for visual disturbance.  Cardiovascular: Positive for chest pain, dyspnea on exertion and leg swelling. Negative for claudication, orthopnea, palpitations and syncope.  Respiratory: Negative for hemoptysis and wheezing.   Endocrine: Negative for cold intolerance and heat intolerance.  Hematologic/Lymphatic: Does not bruise/bleed easily.  Skin: Negative for nail changes.  Musculoskeletal: Negative for muscle weakness and myalgias.  Gastrointestinal:  Negative for abdominal pain, change in bowel habit, nausea and vomiting.  Neurological: Positive for headaches. Negative for difficulty with concentration, dizziness and focal weakness.  Psychiatric/Behavioral: Negative for altered mental status and suicidal ideas.  All other systems  reviewed and are negative.     Objective:  Blood pressure 124/75, pulse 77, height 5' 7"  (1.702 m), weight 188 lb (85.3 kg), last menstrual period 12/18/2005, SpO2 98 %. Body mass index is 29.44 kg/m.    Physical Exam  Constitutional: She is oriented to person, place, and time. Vital signs are normal. She appears well-developed and well-nourished.  HENT:  Head: Normocephalic and atraumatic.  Neck: Normal range of motion.  Cardiovascular: Normal rate, regular rhythm, normal heart sounds and intact distal pulses.  Pulmonary/Chest: Effort normal and breath sounds normal. No accessory muscle usage. No respiratory distress.  Abdominal: Soft. Bowel sounds are normal.  Musculoskeletal: Normal range of motion.  Neurological: She is alert and oriented to person, place, and time.  Skin: Skin is warm and dry.  Vitals reviewed.  Radiology: No results found.  Laboratory examination:   02/28/2019: HgbA1c 5.4%. Creatinine 0.8, eGFR 73/89, potassium 4.7, CMP normal. CBC normal. Cholesterol 242, triglycerides 83, HDL 58, LDL 167.   CMP Latest Ref Rng & Units 08/26/2018 12/19/2016 04/04/2015  Glucose 70 - 99 mg/dL 112(H) 97 102(H)  BUN 6 - 20 mg/dL 20 15 17   Creatinine 0.44 - 1.00 mg/dL 0.85 0.76 0.99  Sodium 135 - 145 mmol/L 141 135 136  Potassium 3.5 - 5.1 mmol/L 4.4 3.6 3.7  Chloride 98 - 111 mmol/L 107 102 104  CO2 22 - 32 mmol/L 26 26 20   Calcium 8.9 - 10.3 mg/dL 9.1 9.4 9.3  Total Protein 6.5 - 8.1 g/dL 6.7 7.1 6.8  Total Bilirubin 0.3 - 1.2 mg/dL 0.6 0.9 1.6(H)  Alkaline Phos 38 - 126 U/L 65 75 73  AST 15 - 41 U/L 13(L) 17 19  ALT 0 - 44 U/L 13 17 14    CBC Latest Ref Rng & Units 08/26/2018 12/19/2016 04/04/2015   WBC 4.0 - 10.5 K/uL 12.8(H) 7.3 6.3  Hemoglobin 12.0 - 15.0 g/dL 13.2 12.3 12.5  Hematocrit 36.0 - 46.0 % 39.7 37.9 37.1  Platelets 150 - 400 K/uL 244 257 246   Lipid Panel     Component Value Date/Time   CHOL 188 04/05/2015 0746   TRIG 36 04/05/2015 0746   HDL 62 04/05/2015 0746   CHOLHDL 3.0 04/05/2015 0746   VLDL 7 04/05/2015 0746   LDLCALC 119 (H) 04/05/2015 0746   HEMOGLOBIN A1C Lab Results  Component Value Date   HGBA1C 5.7 (H) 04/05/2015   MPG 117 04/05/2015   TSH No results for input(s): TSH in the last 8760 hours.  PRN Meds:. Medications Discontinued During This Encounter  Medication Reason  . rosuvastatin (CRESTOR) 20 MG tablet Error   Current Meds  Medication Sig  . ALPRAZolam (XANAX) 0.5 MG tablet Take 0.5-0.75 mg by mouth 2 (two) times daily as needed for anxiety or sleep (panic attacks).   Marland Kitchen amphetamine-dextroamphetamine (ADDERALL) 20 MG tablet Take 20 mg by mouth daily.   Marland Kitchen aspirin EC 81 MG tablet Take 1 tablet (81 mg total) by mouth daily.  Marland Kitchen aspirin-acetaminophen-caffeine (EXCEDRIN MIGRAINE) 250-250-65 MG tablet Take by mouth as needed.  . dicyclomine (BENTYL) 10 MG capsule Take 20 mg by mouth 3 (three) times daily as needed for spasms.   Marland Kitchen estazolam (PROSOM) 2 MG tablet Take 1 mg by mouth at bedtime.   Marland Kitchen FLUoxetine (PROZAC) 40 MG capsule Take 60 mg by mouth daily.   . fluticasone (FLONASE) 50 MCG/ACT nasal spray as needed.  . nitroGLYCERIN (NITROSTAT) 0.4 MG SL tablet Place 1 tablet (0.4 mg total) under the  tongue every 5 (five) minutes as needed for chest pain.  Marland Kitchen ondansetron (ZOFRAN) 4 MG tablet Take 1 tablet (4 mg total) by mouth every 8 (eight) hours as needed for nausea or vomiting.  . pantoprazole (PROTONIX) 40 MG tablet Take 40 mg by mouth as needed.   . pseudoephedrine-acetaminophen (TYLENOL SINUS) 30-500 MG TABS tablet Take 1 tablet by mouth every 4 (four) hours as needed. Congestion  . tamsulosin (FLOMAX) 0.4 MG CAPS capsule Take 1 po QD until  you pass the stone.  . traZODone (DESYREL) 150 MG tablet Take 75-150 mg by mouth at bedtime as needed for sleep.     Cardiac Studies:   Echocardiogram 05/19/2019 :  Normal LV systolic function with EF 55%. Left ventricle cavity is normal in size. Mild to moderate concentric hypertrophy of the left ventricle. Normal global wall motion.  E/A 0.82. Diastolic function not completely assessed.  Calculated EF 55%. Left atrial cavity is normal in size. Interatrial septum is intact. Double contrast study negative for intra-cardiac shunt. No late appearance of bubbles also. Mild tricuspid regurgitation. No evidence of pulmonary hypertension.  Exercise Myoview stress test 05/16/2019: Exercise treadmill stress test was performed using Bruce protocol. Patient walked for 7.04 min, achieving 8.6 METS and a maximum heart rate of 138 beats per minute, which is 86% of maximum predicted heart rate. Hemodynamic response was normal. Exercise capacity was normal. No ischemic changes noted on stress electrocardiogram. No sustained stress arrhythmias were noted. Myocardial pefusion imaging is normal. Left ventricular ejection fraction is 72% with normal wall motion. Low risk study.   Assessment:   Angina pectoris ([place]) - Plan: EKG 12-Lead  Dyspnea on exertion - Plan: EKG 12-Lead  History of TIA (transient ischemic attack) - Plan: EKG 12-Lead  Hypercholesteremia - Plan: EKG 12-Lead  Migraine with aura and without status migrainosus, not intractable - Plan: EKG 12-Lead  OSA (obstructive sleep apnea)  EKG 05/26/2019: Normal sinus rhythm at 78 bpm, left atrial enlargement, normal axis, no evidence of ischemia.   Recommendations:   I have discussed recently obtained stress test and echocardiogram results with the patient. No evidence of ischemia and good exercise tolerance on stress testing; however, she continues to have concerning symptoms of angina that are nitroglycerin responsive.  In view of her family  history of early CAD, uncontrolled hyperlipidemia, history of TIA, and concerning symptoms, feel that patient should have further cardiac evaluation. I have discussed options including risk/benefits of Coronary CTA vs. Coronary angiogram. Patient is concerned about her symptoms and feels that something isn't right, she wishes to proceed with coronary angiogram. Schedule for cardiac catheterization, and possible angioplasty. We discussed regarding risks, benefits, alternatives to this including stress testing, CTA and continued medical therapy. Patient wants to proceed. Understands <1-2% risk of death, stroke, MI, urgent CABG, bleeding, infection, renal failure but not limited to these. I will start her on metoprolol succinate 25 mg daily for anti-anginal therapy. Advised her to continue with nitroglycerin as needed.   She is now taking Crestor regularly, encouraged her to continue with this and to also use CPAP daily. Her dyspnea may also be contributed by uncontrolled OSA and weight gain. I will see her back after the procedure for further recommendations and reevaluation.    Miquel Dunn, MSN, APRN, FNP-C [facility]. [place] Office: [phone] Fax: [phone]

## 2019-05-28 ENCOUNTER — Encounter: Payer: Self-pay | Admitting: Cardiology

## 2019-06-03 ENCOUNTER — Other Ambulatory Visit: Payer: Self-pay | Admitting: Cardiology

## 2019-06-03 DIAGNOSIS — I209 Angina pectoris, unspecified: Secondary | ICD-10-CM

## 2019-06-03 NOTE — Addendum Note (Signed)
Addended by: Gwinda Maine on: 06/03/2019 11:51 AM   Modules accepted: Orders

## 2019-06-03 NOTE — Progress Notes (Signed)
Pre-procedure lab orders placed

## 2019-06-10 DIAGNOSIS — I209 Angina pectoris, unspecified: Secondary | ICD-10-CM | POA: Diagnosis not present

## 2019-06-11 LAB — BASIC METABOLIC PANEL
BUN/Creatinine Ratio: 24 — ABNORMAL HIGH (ref 9–23)
BUN: 22 mg/dL (ref 6–24)
CO2: 23 mmol/L (ref 20–29)
Calcium: 9.6 mg/dL (ref 8.7–10.2)
Chloride: 106 mmol/L (ref 96–106)
Creatinine, Ser: 0.91 mg/dL (ref 0.57–1.00)
GFR calc Af Amer: 80 mL/min/{1.73_m2} (ref >59)
GFR calc non Af Amer: 69 mL/min/{1.73_m2} (ref >59)
Glucose: 94 mg/dL (ref 65–99)
Potassium: 4.5 mmol/L (ref 3.5–5.2)
Sodium: 140 mmol/L (ref 134–144)

## 2019-06-11 LAB — CBC
Hematocrit: 39.6 % (ref 34.0–46.6)
Hemoglobin: 13 g/dL (ref 11.1–15.9)
MCH: 29.2 pg (ref 26.6–33.0)
MCHC: 32.8 g/dL (ref 31.5–35.7)
MCV: 89 fL (ref 79–97)
Platelets: 249 10*3/uL (ref 150–450)
RBC: 4.45 x10E6/uL (ref 3.77–5.28)
RDW: 13 % (ref 11.7–15.4)
WBC: 5.7 10*3/uL (ref 3.4–10.8)

## 2019-06-20 ENCOUNTER — Other Ambulatory Visit (HOSPITAL_COMMUNITY)
Admission: RE | Admit: 2019-06-20 | Discharge: 2019-06-20 | Disposition: A | Discharge status: Home / Self Care | Payer: BC Managed Care – PPO | Source: Ambulatory Visit | Attending: Cardiology | Admitting: Cardiology

## 2019-06-20 DIAGNOSIS — Z1159 Encounter for screening for other viral diseases: Secondary | ICD-10-CM | POA: Insufficient documentation

## 2019-06-20 DIAGNOSIS — Z01812 Encounter for preprocedural laboratory examination: Secondary | ICD-10-CM | POA: Diagnosis not present

## 2019-06-20 LAB — SARS CORONAVIRUS 2 (TAT 6-24 HRS): SARS Coronavirus 2: NEGATIVE

## 2019-06-24 ENCOUNTER — Ambulatory Visit (HOSPITAL_COMMUNITY)
Admission: RE | Admit: 2019-06-24 | Discharge: 2019-06-24 | Disposition: A | Discharge status: Home / Self Care | Payer: BC Managed Care – PPO | Attending: Cardiology | Admitting: Cardiology

## 2019-06-24 ENCOUNTER — Encounter (HOSPITAL_COMMUNITY)
Admission: RE | Disposition: A | Discharge status: Home / Self Care | Payer: Self-pay | Source: Home / Self Care | Attending: Cardiology

## 2019-06-24 ENCOUNTER — Other Ambulatory Visit: Payer: Self-pay

## 2019-06-24 ENCOUNTER — Encounter (HOSPITAL_COMMUNITY): Payer: Self-pay | Admitting: Cardiology

## 2019-06-24 DIAGNOSIS — M1990 Unspecified osteoarthritis, unspecified site: Secondary | ICD-10-CM | POA: Insufficient documentation

## 2019-06-24 DIAGNOSIS — K589 Irritable bowel syndrome without diarrhea: Secondary | ICD-10-CM | POA: Diagnosis not present

## 2019-06-24 DIAGNOSIS — F988 Other specified behavioral and emotional disorders with onset usually occurring in childhood and adolescence: Secondary | ICD-10-CM | POA: Diagnosis not present

## 2019-06-24 DIAGNOSIS — Z8673 Personal history of transient ischemic attack (TIA), and cerebral infarction without residual deficits: Secondary | ICD-10-CM | POA: Diagnosis not present

## 2019-06-24 DIAGNOSIS — G43109 Migraine with aura, not intractable, without status migrainosus: Secondary | ICD-10-CM | POA: Diagnosis not present

## 2019-06-24 DIAGNOSIS — H9191 Unspecified hearing loss, right ear: Secondary | ICD-10-CM | POA: Diagnosis not present

## 2019-06-24 DIAGNOSIS — Z90710 Acquired absence of both cervix and uterus: Secondary | ICD-10-CM | POA: Insufficient documentation

## 2019-06-24 DIAGNOSIS — E785 Hyperlipidemia, unspecified: Secondary | ICD-10-CM | POA: Diagnosis not present

## 2019-06-24 DIAGNOSIS — Z8249 Family history of ischemic heart disease and other diseases of the circulatory system: Secondary | ICD-10-CM | POA: Diagnosis not present

## 2019-06-24 DIAGNOSIS — Z79899 Other long term (current) drug therapy: Secondary | ICD-10-CM | POA: Diagnosis not present

## 2019-06-24 DIAGNOSIS — G4700 Insomnia, unspecified: Secondary | ICD-10-CM | POA: Insufficient documentation

## 2019-06-24 DIAGNOSIS — E7800 Pure hypercholesterolemia, unspecified: Secondary | ICD-10-CM | POA: Insufficient documentation

## 2019-06-24 DIAGNOSIS — Z7982 Long term (current) use of aspirin: Secondary | ICD-10-CM | POA: Insufficient documentation

## 2019-06-24 DIAGNOSIS — G4733 Obstructive sleep apnea (adult) (pediatric): Secondary | ICD-10-CM | POA: Insufficient documentation

## 2019-06-24 DIAGNOSIS — R0609 Other forms of dyspnea: Secondary | ICD-10-CM | POA: Insufficient documentation

## 2019-06-24 DIAGNOSIS — R0789 Other chest pain: Secondary | ICD-10-CM | POA: Diagnosis not present

## 2019-06-24 DIAGNOSIS — R079 Chest pain, unspecified: Secondary | ICD-10-CM | POA: Diagnosis present

## 2019-06-24 HISTORY — PX: LEFT HEART CATH AND CORONARY ANGIOGRAPHY: CATH118249

## 2019-06-24 SURGERY — LEFT HEART CATH AND CORONARY ANGIOGRAPHY
Anesthesia: LOCAL

## 2019-06-24 MED ORDER — IOHEXOL 350 MG/ML SOLN
INTRAVENOUS | Status: DC | PRN
  Administered 2019-06-24: 40 mL via INTRA_ARTERIAL

## 2019-06-24 MED ORDER — LIDOCAINE HCL (PF) 1 % IJ SOLN
INTRAMUSCULAR | Status: AC
  Filled 2019-06-24: qty 30

## 2019-06-24 MED ORDER — SODIUM CHLORIDE 0.9% FLUSH: 3.0000 mL | Freq: Two times a day (BID) | INTRAVENOUS | Status: DC

## 2019-06-24 MED ORDER — VERAPAMIL HCL 2.5 MG/ML IV SOLN
INTRAVENOUS | Status: DC | PRN
  Administered 2019-06-24: 3 mL via INTRA_ARTERIAL

## 2019-06-24 MED ORDER — MIDAZOLAM HCL 2 MG/2ML IJ SOLN
INTRAMUSCULAR | Status: AC
  Filled 2019-06-24: qty 2

## 2019-06-24 MED ORDER — SODIUM CHLORIDE 0.9 % WEIGHT BASED INFUSION: 1.0000 mL/kg/h | INTRAVENOUS | Status: DC

## 2019-06-24 MED ORDER — SODIUM CHLORIDE 0.9% FLUSH: 3.0000 mL | INTRAVENOUS | Status: DC | PRN

## 2019-06-24 MED ORDER — SODIUM CHLORIDE 0.9 % WEIGHT BASED INFUSION
3.0000 mL/kg/h | INTRAVENOUS | Status: AC
  Administered 2019-06-24: 3 mL/kg/h via INTRAVENOUS

## 2019-06-24 MED ORDER — VERAPAMIL HCL 2.5 MG/ML IV SOLN
INTRAVENOUS | Status: AC
  Filled 2019-06-24: qty 2

## 2019-06-24 MED ORDER — FENTANYL CITRATE (PF) 100 MCG/2ML IJ SOLN
INTRAMUSCULAR | Status: AC
  Filled 2019-06-24: qty 2

## 2019-06-24 MED ORDER — LIDOCAINE HCL (PF) 1 % IJ SOLN
INTRAMUSCULAR | Status: DC | PRN
  Administered 2019-06-24: 2 mL

## 2019-06-24 MED ORDER — ASPIRIN 81 MG PO CHEW: 81.0000 mg | CHEWABLE_TABLET | ORAL | Status: DC

## 2019-06-24 MED ORDER — SODIUM CHLORIDE 0.9 % IV SOLN: 250.0000 mL | INTRAVENOUS | Status: DC | PRN

## 2019-06-24 MED ORDER — MIDAZOLAM HCL 2 MG/2ML IJ SOLN
INTRAMUSCULAR | Status: DC | PRN
  Administered 2019-06-24: 1 mg via INTRAVENOUS
  Administered 2019-06-24: 2 mg via INTRAVENOUS

## 2019-06-24 MED ORDER — FENTANYL CITRATE (PF) 100 MCG/2ML IJ SOLN
INTRAMUSCULAR | Status: DC | PRN
  Administered 2019-06-24: 25 ug via INTRAVENOUS

## 2019-06-24 MED ORDER — HEPARIN (PORCINE) IN NACL 1000-0.9 UT/500ML-% IV SOLN
INTRAVENOUS | Status: AC
  Filled 2019-06-24: qty 1000

## 2019-06-24 MED ORDER — HEPARIN (PORCINE) IN NACL 1000-0.9 UT/500ML-% IV SOLN
INTRAVENOUS | Status: DC | PRN
  Administered 2019-06-24 (×2): 500 mL

## 2019-06-24 MED ORDER — HEPARIN SODIUM (PORCINE) 1000 UNIT/ML IJ SOLN
INTRAMUSCULAR | Status: DC | PRN
  Administered 2019-06-24: 4500 [IU] via INTRAVENOUS

## 2019-06-24 MED ORDER — ASPIRIN 81 MG PO CHEW
CHEWABLE_TABLET | ORAL | Status: AC
  Administered 2019-06-24: 81 mg
  Filled 2019-06-24: qty 1

## 2019-06-24 SURGICAL SUPPLY — 11 items
CATH OPTITORQUE TIG 4.0 5F (CATHETERS) ×1 IMPLANT
COVER DOME SNAP 22 D (MISCELLANEOUS) ×1 IMPLANT
DEVICE RAD COMP TR BAND LRG (VASCULAR PRODUCTS) ×1 IMPLANT
GLIDESHEATH SLEND A-KIT 6F 22G (SHEATH) ×1 IMPLANT
GUIDEWIRE INQWIRE 1.5J.035X260 (WIRE) IMPLANT
INQWIRE 1.5J .035X260CM (WIRE) ×2
KIT HEART LEFT (KITS) ×2 IMPLANT
PACK CARDIAC CATHETERIZATION (CUSTOM PROCEDURE TRAY) ×2 IMPLANT
SHEATH PROBE COVER 6X72 (BAG) ×1 IMPLANT
TRANSDUCER W/STOPCOCK (MISCELLANEOUS) ×2 IMPLANT
TUBING CIL FLEX 10 FLL-RA (TUBING) ×2 IMPLANT

## 2019-06-24 NOTE — Discharge Instructions (Signed)
Radial Site Care ° °This sheet gives you information about how to care for yourself after your procedure. Your health care provider may also give you more specific instructions. If you have problems or questions, contact your health care provider. °What can I expect after the procedure? °After the procedure, it is common to have: °· Bruising and tenderness at the catheter insertion area. °Follow these instructions at home: °Medicines °· Take over-the-counter and prescription medicines only as told by your health care provider. °Insertion site care °· Follow instructions from your health care provider about how to take care of your insertion site. Make sure you: °? Wash your hands with soap and water before you change your bandage (dressing). If soap and water are not available, use hand sanitizer. °? Change your dressing as told by your health care provider. °? Leave stitches (sutures), skin glue, or adhesive strips in place. These skin closures may need to stay in place for 2 weeks or longer. If adhesive strip edges start to loosen and curl up, you may trim the loose edges. Do not remove adhesive strips completely unless your health care provider tells you to do that. °· Check your insertion site every day for signs of infection. Check for: °? Redness, swelling, or pain. °? Fluid or blood. °? Pus or a bad smell. °? Warmth. °· Do not take baths, swim, or use a hot tub until your health care provider approves. °· You may shower 24-48 hours after the procedure, or as directed by your health care provider. °? Remove the dressing and gently wash the site with plain soap and water. °? Pat the area dry with a clean towel. °? Do not rub the site. That could cause bleeding. °· Do not apply powder or lotion to the site. °Activity ° °· For 24 hours after the procedure, or as directed by your health care provider: °? Do not flex or bend the affected arm. °? Do not push or pull heavy objects with the affected arm. °? Do not  drive yourself home from the hospital or clinic. You may drive 24 hours after the procedure unless your health care provider tells you not to. °? Do not operate machinery or power tools. °· Do not lift anything that is heavier than 10 lb (4.5 kg), or the limit that you are told, until your health care provider says that it is safe. °· Ask your health care provider when it is okay to: °? Return to work or school. °? Resume usual physical activities or sports. °? Resume sexual activity. °General instructions °· If the catheter site starts to bleed, raise your arm and put firm pressure on the site. If the bleeding does not stop, get help right away. This is a medical emergency. °· If you went home on the same day as your procedure, a responsible adult should be with you for the first 24 hours after you arrive home. °· Keep all follow-up visits as told by your health care provider. This is important. °Contact a health care provider if: °· You have a fever. °· You have redness, swelling, or yellow drainage around your insertion site. °Get help right away if: °· You have unusual pain at the radial site. °· The catheter insertion area swells very fast. °· The insertion area is bleeding, and the bleeding does not stop when you hold steady pressure on the area. °· Your arm or hand becomes pale, cool, tingly, or numb. °These symptoms may represent a serious problem   that is an emergency. Do not wait to see if the symptoms will go away. Get medical help right away. Call your local emergency services (911 in the [place] Do not drive yourself to the hospital. °Summary °· After the procedure, it is common to have bruising and tenderness at the site. °· Follow instructions from your health care provider about how to take care of your radial site wound. Check the wound every day for signs of infection. °· Do not lift anything that is heavier than 10 lb (4.5 kg), or the limit that you are told, until your health care provider says  that it is safe. °This information is not intended to replace advice given to you by your health care provider. Make sure you discuss any questions you have with your health care provider. °Document Released: 01/06/2011 Document Revised: 01/09/2018 Document Reviewed: 01/09/2018 °Elsevier Patient Education © 2020 [employer] ° °

## 2019-06-24 NOTE — Interval H&P Note (Signed)
History and Physical Interval Note:  06/24/2019 9:02 AM  Natalie Le  has presented today for surgery, with the diagnosis of Unstable angina.  The various methods of treatment have been discussed with the patient and family. After consideration of risks, benefits and other options for treatment, the patient has consented to  Procedure(s): LEFT HEART CATH AND CORONARY ANGIOGRAPHY (N/A) as a surgical intervention.  The patient's history has been reviewed, patient examined, no change in status, stable for surgery.  I have reviewed the patient's chart and labs.  Questions were answered to the patient's satisfaction.   Symptom Status: Ischemic Symptoms Non-invasive Testing: Low risk If no or indeterminate stress test, FFR/iFR results in all diseased vessels: N/A Diabetes Mellitus: No S/P CABG: No Antianginal therapy (number of long-acting drugs): 1 Patient undergoing renal transplant: No Patient undergoing percutaneous valve procedure: No   1 Vessel Disease No proximal LAD involvement, No proximal left dominant LCX involvement  PCI: M (4);  Indication 1  CABG: R (3);  Indication 1 Proximal left dominant LCX involvement  PCI: M (5);  Indication 4  CABG: M (5);  Indication 4 Proximal LAD involvement  PCI: M (5);  Indication 4  CABG: M (5);  Indication 4  2 Vessel Disease No proximal LAD involvement  PCI: M (5);  Indication 7  CABG: M (4);  Indication 7 Proximal LAD involvement  PCI: M (6);  Indication 10  CABG: M (6);  Indication 10  3 Vessel Disease Low disease complexity (e.g., focal stenoses, SYNTAX <=22)  PCI: M (6);  Indication 16  CABG: M (6);  Indication 16 Intermediate or high disease complexity (e.g., SYNTAX >=23)  PCI: M (5);  Indication 20  CABG: A (7);  Indication 20  Left Main Disease Isolated LMCA disease: ostial or midshaft  PCI: A (7);  Indication 24  CABG: A (9);  Indication 24 Isolated LMCA disease: bifurcation involvement  PCI: M (5);  Indication  25  CABG: A (9);  Indication 25 LMCA ostial or midshaft, concurrent low disease burden multivessel disease (e.g., 1-2 additional focal stenoses, SYNTAX <=22)  PCI: A (7);  Indication 26  CABG: A (9);  Indication 26 LMCA ostial or midshaft, concurrent intermediate or high disease burden multivessel disease (e.g., 1-2 additional bifurcation stenoses, long stenoses, SYNTAX >=23)  PCI: M (4);  Indication 27  CABG: A (9);  Indication 27 LMCA bifurcation involvement, concurrent low disease burden multivessel disease (e.g., 1-2 additional focal stenoses, SYNTAX <=22)  PCI: M (5);  Indication 28  CABG: A (9);  Indication 28 LMCA bifurcation involvement, concurrent intermediate or high disease burden multivessel disease (e.g., 1-2 additional bifurcation stenoses, long stenoses, SYNTAX >=23)  PCI: R (3);  Indication 29  CABG: A (9);  Indication 29  Notes:  A indicates appropriate. M indicates may be appropriate. R indicates rarely appropriate. Number in parentheses is median score for that indication. Reclassify indicates number of functionally diseased vessels should be decreased given negative FFR/iFR. Re-evaluate the scenario interpreting any FFR/iFR negative vessel as being not significantly stenosed.  Disease means involved vessel provides flow to a sufficient amount of myocardium to be clinically important.  If FFR testing indicates a vessel is not significant, that vessel should not be considered diseased (and the patient should be reclassified with respect to extent of functionally significant disease).  Proximal LAD + proximal left dominant LCX is considered 3 vessel CAD  2 Vessel CAD with FFR/iFR abnormal in only 1 but not both is considered 1  vessel CAD  Disease complexity includes occlusion, bifurcation, trifurcation, ostial, >20 mm, tortuosity, calcification, thrombus  LMCA disease is >=50% by angiography, MLD <2.8 mm, MLA <6 mm2; MLA 6-7.5 mm2 requires further physiologic  See Table B for  risk stratification based on noninvasive testing  Journal of the [employer] of Cardiology Mar 2017, 23391; DOI: 10.1016/j.[email or web address] [email or web address].full-text.pdf This App  2018 by the Society for Cardiovascular Angiography and Interventions   Adrian Prows

## 2019-06-26 ENCOUNTER — Ambulatory Visit: Payer: BLUE CROSS/BLUE SHIELD | Admitting: Cardiology

## 2019-07-02 ENCOUNTER — Ambulatory Visit: Payer: BLUE CROSS/BLUE SHIELD | Admitting: Cardiology

## 2019-07-02 NOTE — Progress Notes (Deleted)
Primary Physician:  Leanna Battles, MD   [name], female    DOB: 06-04-1961, 58 y.o.   MRN: [id number]  Subjective:    No chief complaint on file.   HPI: Natalie Le  is a 58 y.o. female  with OSA on CPAP, migraines, hiatal hernia, history of upper GI bleed in July 2019, family history of CAD, questionable history of TIA in 2016, hyperlipidemia, depression, and bipolar disorder, recently evaluated by [place] for angina. She underwent echo and exercise nuclear stress test and now presents to discuss results.   Patient has had continued episodes of chest discomfort that radiates to her back associated with shortness of breath that can occur both with exertion or at rest. She has used nitroglycerin twice that did help her chest discomfort after a minute or two. She doesn't have chest pain every time she exerts herself, but is more frequent over the last few months. She does get short of breath with minimal activities such as walking back from her horse barn or walking up hill. She was also previously able to stack 15-20 bales of hay, but over the last 1 month is only able to stack 3 bales of hay before she has to rest.   Denies any history of hypertension, diabetes, or thyroid disorders. No former tobacco use. Does have hyperlipidemia that is uncontrolled as she had previously not been taking her statin regularly. She also has OSA, but does not use CPAP regularly.She has chronic right ear hearing loss from 2016. In 2016, she had an episode of slurred speech felt to be related to TIA. MRI and MRA of brain was negative.   She does not exercise, but is fairly active with taking care of her horses. Does mention having difficulty getting out of the bed and not wanting to do things.   Past Medical History:  Diagnosis Date  . ADD (attention deficit disorder)   . Allergy    takes Allegra D daily prn allergies and Flonase prn allergies  . Anxiety    takes Xanax prn anxiety  .  Arthritis   . Bipolar 2 disorder ([place])    takes Adderall daily  . 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 Chronic low back pain   . Depression    takes Prozac daily  . Dizziness   . Hearing impaired    deaf in right ear  . Hiatal hernia   . History of fracture of nasal bone 07/01/2007  . History of migraine    last one on 10/09/13  . Hyperlipidemia   . IBS (irritable bowel syndrome)   . Insomnia    takes Trazodone nightly prn sleep  . Laryngitis   . LVH (left ventricular hypertrophy) 04/05/2015   Mild, Noted on ECHO  . Muscle spasms of neck    takes Flexeril daily prn muscle spasms  . OSA on CPAP 05/14/2014   resolved after weight loss  . Pleurisy    when in college  . Pneumonia    walking pneumonia in college  . PONV (postoperative nausea and vomiting)   . Right ureteral stone    4 x 3 mm stone at the right ureterovesical junction   . Shortness of breath    with exertion/sitting  . TIA (transient ischemic attack) 03/2015  . Toe fracture, right 04/2017   great toe  . Urinary frequency   . Viral infection    09/25/13-was given an inhaler d/t SOB;was on ZPak  .  Weakness    both hands    Past Surgical History:  Procedure Laterality Date  . ABDOMINAL HERNIA REPAIR    . ABDOMINAL HYSTERECTOMY    . bladder  stretch    . BUNIONECTOMY    . COCHLEAR IMPLANT Right   . COLONOSCOPY    . cyst removed from left arm      as a child  . ELBOW FRACTURE SURGERY Right   . fx nose repair    . LEFT HEART CATH AND CORONARY ANGIOGRAPHY N/A 06/24/2019   Procedure: LEFT HEART CATH AND CORONARY ANGIOGRAPHY;  Surgeon: Adrian Prows, MD;  Location: [facility];  Service: Cardiovascular;  Laterality: N/A;  . NECK SURGERY  12/2015  . tumor removed from left breast  40yr ago  . UPPER GASTROINTESTINAL ENDOSCOPY      Social History   Socioeconomic History  . Marital status: Single    Spouse name: Not on file  . Number of children: 0  . Years of education: college  . Highest education  level: Not on file  Occupational History    Employer: [employer]    Comment: Sales  Social Needs  . Financial resource strain: Not on file  . Food insecurity    Worry: Not on file    Inability: Not on file  . Transportation needs    Medical: Not on file    Non-medical: Not on file  Tobacco Use  . Smoking status: Never Smoker  . Smokeless tobacco: Never Used  Substance and Sexual Activity  . Alcohol use: Yes    Alcohol/week: 1.0 standard drinks    Types: 1 Glasses of wine per week    Comment: rarely  . Drug use: No  . Sexual activity: Yes    Birth control/protection: Surgical  Lifestyle  . Physical activity    Days per week: Not on file    Minutes per session: Not on file  . Stress: Not on file  Relationships  . Social [place] phone: Not on file    Gets together: Not on file    Attends religious service: Not on file    Active member of club or organization: Not on file    Attends meetings of clubs or organizations: Not on file    Relationship status: Not on file  . Intimate partner violence    Fear of current or ex partner: Not on file    Emotionally abused: Not on file    Physically abused: Not on file    Forced sexual activity: Not on file  Other Topics Concern  . Not on file  Social History Narrative   Patient lives at home with her boyfriend.   Patient works full time sPress photographer   Education college degree   Right handed   Caffeine one cup of coffee daily sometimes tea.    Review of Systems  Constitution: Positive for malaise/fatigue and weight gain. Negative for decreased appetite and weight loss.  Eyes: Negative for visual disturbance.  Cardiovascular: Positive for chest pain, dyspnea on exertion and leg swelling. Negative for claudication, orthopnea, palpitations and syncope.  Respiratory: Negative for hemoptysis and wheezing.   Endocrine: Negative for cold intolerance and heat intolerance.  Hematologic/Lymphatic: Does not bruise/bleed  easily.  Skin: Negative for nail changes.  Musculoskeletal: Negative for muscle weakness and myalgias.  Gastrointestinal: Negative for abdominal pain, change in bowel habit, nausea and vomiting.  Neurological: Positive for headaches. Negative for difficulty with concentration, dizziness and  focal weakness.  Psychiatric/Behavioral: Negative for altered mental status and suicidal ideas.  All other systems reviewed and are negative.     Objective:  Last menstrual period 12/18/2005. There is no height or weight on file to calculate BMI.    Physical Exam  Constitutional: She is oriented to person, place, and time. Vital signs are normal. She appears well-developed and well-nourished.  HENT:  Head: Normocephalic and atraumatic.  Neck: Normal range of motion.  Cardiovascular: Normal rate, regular rhythm, normal heart sounds and intact distal pulses.  Pulmonary/Chest: Effort normal and breath sounds normal. No accessory muscle usage. No respiratory distress.  Abdominal: Soft. Bowel sounds are normal.  Musculoskeletal: Normal range of motion.  Neurological: She is alert and oriented to person, place, and time.  Skin: Skin is warm and dry.  Vitals reviewed.  Radiology: No results found.  Laboratory examination:   02/28/2019: HgbA1c 5.4%. Creatinine 0.8, eGFR 73/89, potassium 4.7, CMP normal. CBC normal. Cholesterol 242, triglycerides 83, HDL 58, LDL 167.   CMP Latest Ref Rng & Units 06/10/2019 08/26/2018 12/19/2016  Glucose 65 - 99 mg/dL 94 112(H) 97  BUN 6 - 24 mg/dL 22 20 15   Creatinine 0.57 - 1.00 mg/dL 0.91 0.85 0.76  Sodium 134 - 144 mmol/L 140 141 135  Potassium 3.5 - 5.2 mmol/L 4.5 4.4 3.6  Chloride 96 - 106 mmol/L 106 107 102  CO2 20 - 29 mmol/L 23 26 26   Calcium 8.7 - 10.2 mg/dL 9.6 9.1 9.4  Total Protein 6.5 - 8.1 g/dL - 6.7 7.1  Total Bilirubin 0.3 - 1.2 mg/dL - 0.6 0.9  Alkaline Phos 38 - 126 U/L - 65 75  AST 15 - 41 U/L - 13(L) 17  ALT 0 - 44 U/L - 13 17   CBC Latest Ref  Rng & Units 06/10/2019 08/26/2018 12/19/2016  WBC 3.4 - 10.8 x10E3/uL 5.7 12.8(H) 7.3  Hemoglobin 11.1 - 15.9 g/dL 13.0 13.2 12.3  Hematocrit 34.0 - 46.6 % 39.6 39.7 37.9  Platelets 150 - 450 x10E3/uL 249 244 257   Lipid Panel     Component Value Date/Time   CHOL 188 04/05/2015 0746   TRIG 36 04/05/2015 0746   HDL 62 04/05/2015 0746   CHOLHDL 3.0 04/05/2015 0746   VLDL 7 04/05/2015 0746   LDLCALC 119 (H) 04/05/2015 0746   HEMOGLOBIN A1C Lab Results  Component Value Date   HGBA1C 5.7 (H) 04/05/2015   MPG 117 04/05/2015   TSH No results for input(s): TSH in the last 8760 hours.  PRN Meds:. There are no discontinued medications. No outpatient medications have been marked as taking for the 07/02/19 encounter (Appointment) with Miquel Dunn, NP.    Cardiac Studies:   Coronary angiogram 06/24/2019: Normal coronary arteries. Normal LVEF and LV end diastolic pressures.   Echocardiogram 05/19/2019 :  Normal LV systolic function with EF 55%. Left ventricle cavity is normal in size. Mild to moderate concentric hypertrophy of the left ventricle. Normal global wall motion.  E/A 0.82. Diastolic function not completely assessed.  Calculated EF 55%. Left atrial cavity is normal in size. Interatrial septum is intact. Double contrast study negative for intra-cardiac shunt. No late appearance of bubbles also. Mild tricuspid regurgitation. No evidence of pulmonary hypertension.  Exercise Myoview stress test 05/16/2019: Exercise treadmill stress test was performed using Bruce protocol. Patient walked for 7.04 min, achieving 8.6 METS and a maximum heart rate of 138 beats per minute, which is 86% of maximum predicted heart rate. Hemodynamic response was  normal. Exercise capacity was normal. No ischemic changes noted on stress electrocardiogram. No sustained stress arrhythmias were noted. Myocardial pefusion imaging is normal. Left ventricular ejection fraction is 72% with normal wall motion. Low  risk study.   Assessment:   No diagnosis found.  EKG 05/26/2019: Normal sinus rhythm at 78 bpm, left atrial enlargement, normal axis, no evidence of ischemia.   Recommendations:   I have discussed recently obtained stress test and echocardiogram results with the patient. No evidence of ischemia and good exercise tolerance on stress testing; however, she continues to have concerning symptoms of angina that are nitroglycerin responsive.  In view of her family history of early CAD, uncontrolled hyperlipidemia, history of TIA, and concerning symptoms, feel that patient should have further cardiac evaluation. I have discussed options including risk/benefits of Coronary CTA vs. Coronary angiogram. Patient is concerned about her symptoms and feels that something isn't right, she wishes to proceed with coronary angiogram. Schedule for cardiac catheterization, and possible angioplasty. We discussed regarding risks, benefits, alternatives to this including stress testing, CTA and continued medical therapy. Patient wants to proceed. Understands <1-2% risk of death, stroke, MI, urgent CABG, bleeding, infection, renal failure but not limited to these. I will start her on metoprolol succinate 25 mg daily for anti-anginal therapy. Advised her to continue with nitroglycerin as needed.   She is now taking Crestor regularly, encouraged her to continue with this and to also use CPAP daily. Her dyspnea may also be contributed by uncontrolled OSA and weight gain. I will see her back after the procedure for further recommendations and reevaluation.    Miquel Dunn, MSN, APRN, FNP-C [facility]. [place] Office: [phone] Fax: [phone]

## 2019-07-03 ENCOUNTER — Ambulatory Visit (INDEPENDENT_AMBULATORY_CARE_PROVIDER_SITE_OTHER): Payer: BC Managed Care – PPO | Admitting: Cardiology

## 2019-07-03 ENCOUNTER — Other Ambulatory Visit: Payer: Self-pay

## 2019-07-03 ENCOUNTER — Encounter: Payer: Self-pay | Admitting: Cardiology

## 2019-07-03 VITALS — BP 125/85 | HR 90 | Ht 67.0 in | Wt 187.2 lb

## 2019-07-03 DIAGNOSIS — R5381 Other malaise: Secondary | ICD-10-CM

## 2019-07-03 DIAGNOSIS — R0609 Other forms of dyspnea: Secondary | ICD-10-CM

## 2019-07-03 DIAGNOSIS — G4733 Obstructive sleep apnea (adult) (pediatric): Secondary | ICD-10-CM

## 2019-07-03 DIAGNOSIS — R0600 Dyspnea, unspecified: Secondary | ICD-10-CM

## 2019-07-03 DIAGNOSIS — E7800 Pure hypercholesterolemia, unspecified: Secondary | ICD-10-CM | POA: Diagnosis not present

## 2019-07-03 DIAGNOSIS — R5383 Other fatigue: Secondary | ICD-10-CM

## 2019-07-03 DIAGNOSIS — R0789 Other chest pain: Secondary | ICD-10-CM | POA: Diagnosis not present

## 2019-07-03 NOTE — Progress Notes (Signed)
Primary Physician:  Leanna Battles, MD   [name], female    DOB: 29-Jul-1961, 58 y.o.   MRN: [id number]  Subjective:    Chief Complaint  Patient presents with  . Shortness of Breath  . Follow-up    post-cath    HPI: Natalie Le  is a 58 y.o. female  with OSA on CPAP, migraines, hiatal hernia, history of upper GI bleed in July 2019, family history of CAD, questionable history of TIA in 2016, hyperlipidemia, depression, and bipolar disorder, with symptoms concerning for angina.  Patient underwent exercise nuclear stress test on 05/16/2019 that was considered low risk study. Echocardiogram in June 2020 revealed normal LVEF, mild to moderate LVH. Due to continued concerning symptoms and her risk factors, it was felt that she best proceed with coronary angiogram in which was performed 06/24/2019 and was normal. She now presents for post cath follow up.  She is relieved; however, feels frustrated as she is still having symptoms like before. Chest pain that radiates to her back, dyspnea on exertion, and fatigue. She questions if related to GERD, fibromyalgia, or depression. States that she is scheduled for endoscopy in the next few weeks. No complications from right radial access site.   Denies any history of hypertension, diabetes, or thyroid disorders. No former tobacco use. Does have hyperlipidemia that is uncontrolled as she had previously not been taking her statin regularly, but is now compliant. She has been using CPAP regularly for the last 2 weeks. She has chronic right ear hearing loss from 2016. In 2016, she had an episode of slurred speech felt to be related to TIA. MRI and MRA of brain was negative.   She does not exercise, but is fairly active with taking care of her horses. Does mention having difficulty getting out of the bed and not wanting to do things.   Past Medical History:  Diagnosis Date  . ADD (attention deficit disorder)   . Allergy    takes  Allegra D daily prn allergies and Flonase prn allergies  . Anxiety    takes Xanax prn anxiety  . Arthritis   . Bipolar 2 disorder ([place])    takes Adderall daily  . 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 Chronic low back pain   . Depression    takes Prozac daily  . Dizziness   . Hearing impaired    deaf in right ear  . Hiatal hernia   . History of fracture of nasal bone 07/01/2007  . History of migraine    last one on 10/09/13  . Hyperlipidemia   . IBS (irritable bowel syndrome)   . Insomnia    takes Trazodone nightly prn sleep  . Laryngitis   . LVH (left ventricular hypertrophy) 04/05/2015   Mild, Noted on ECHO  . Muscle spasms of neck    takes Flexeril daily prn muscle spasms  . OSA on CPAP 05/14/2014   resolved after weight loss  . Pleurisy    when in college  . Pneumonia    walking pneumonia in college  . PONV (postoperative nausea and vomiting)   . Right ureteral stone    4 x 3 mm stone at the right ureterovesical junction   . Shortness of breath    with exertion/sitting  . TIA (transient ischemic attack) 03/2015  . Toe fracture, right 04/2017   great toe  . Urinary frequency   . Viral infection    09/25/13-was given an inhaler  d/t SOB;was on ZPak  . Weakness    both hands    Past Surgical History:  Procedure Laterality Date  . ABDOMINAL HERNIA REPAIR    . ABDOMINAL HYSTERECTOMY    . bladder  stretch    . BUNIONECTOMY    . COCHLEAR IMPLANT Right   . COLONOSCOPY    . cyst removed from left arm      as a child  . ELBOW FRACTURE SURGERY Right   . fx nose repair    . LEFT HEART CATH AND CORONARY ANGIOGRAPHY N/A 06/24/2019   Procedure: LEFT HEART CATH AND CORONARY ANGIOGRAPHY;  Surgeon: Adrian Prows, MD;  Location: [facility];  Service: Cardiovascular;  Laterality: N/A;  . NECK SURGERY  12/2015  . tumor removed from left breast  4yr ago  . UPPER GASTROINTESTINAL ENDOSCOPY      Social History   Socioeconomic History  . Marital status: Single    Spouse  name: Not on file  . Number of children: 0  . Years of education: college  . 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 Financial resource strain: Not on file  . Food insecurity    Worry: Not on file    Inability: Not on file  . Transportation needs    Medical: Not on file    Non-medical: Not on file  Tobacco Use  . Smoking status: Never Smoker  . Smokeless tobacco: Never Used  Substance and Sexual Activity  . Alcohol use: Yes    Alcohol/week: 1.0 standard drinks    Types: 1 Glasses of wine per week    Comment: rarely  . Drug use: No  . Sexual activity: Yes    Birth control/protection: Surgical  Lifestyle  . Physical activity    Days per week: Not on file    Minutes per session: Not on file  . Stress: Not on file  Relationships  . Social [place] phone: Not on file    Gets together: Not on file    Attends religious service: Not on file    Active member of club or organization: Not on file    Attends meetings of clubs or organizations: Not on file    Relationship status: Not on file  . Intimate partner violence    Fear of current or ex partner: Not on file    Emotionally abused: Not on file    Physically abused: Not on file    Forced sexual activity: Not on file  Other Topics Concern  . Not on file  Social History Narrative   Patient lives at home with her boyfriend.   Patient works full time sPress photographer   Education college degree   Right handed   Caffeine one cup of coffee daily sometimes tea.    Review of Systems  Constitution: Positive for malaise/fatigue and weight gain. Negative for decreased appetite and weight loss.  Eyes: Negative for visual disturbance.  Cardiovascular: Positive for chest pain, dyspnea on exertion and leg swelling. Negative for claudication, orthopnea, palpitations and syncope.  Respiratory: Negative for hemoptysis and wheezing.   Endocrine:  Negative for cold intolerance and heat intolerance.  Hematologic/Lymphatic: Does not bruise/bleed easily.  Skin: Negative for nail changes.  Musculoskeletal: Negative for muscle weakness and myalgias.  Gastrointestinal: Negative for abdominal pain, change in bowel habit, nausea and vomiting.  Neurological: Positive for headaches. Negative  for difficulty with concentration, dizziness and focal weakness.  Psychiatric/Behavioral: Negative for altered mental status and suicidal ideas.  All other systems reviewed and are negative.     Objective:  Blood pressure 125/85, pulse 90, height 5' 7" (1.702 m), weight 187 lb 3.2 oz (84.9 kg), last menstrual period 12/18/2005, SpO2 95 %. Body mass index is 29.32 kg/m.    Physical Exam  Constitutional: She is oriented to person, place, and time. Vital signs are normal. She appears well-developed and well-nourished.  HENT:  Head: Normocephalic and atraumatic.  Neck: Normal range of motion.  Cardiovascular: Normal rate, regular rhythm, normal heart sounds and intact distal pulses.  Pulses:      Radial pulses are 2+ on the right side.  Pulmonary/Chest: Effort normal and breath sounds normal. No accessory muscle usage. No respiratory distress.  Abdominal: Soft. Bowel sounds are normal.  Musculoskeletal: Normal range of motion.  Neurological: She is alert and oriented to person, place, and time.  Skin: Skin is warm and dry.  Vitals reviewed.  Radiology: No results found.  Laboratory examination:   02/28/2019: HgbA1c 5.4%. Creatinine 0.8, eGFR 73/89, potassium 4.7, CMP normal. CBC normal. Cholesterol 242, triglycerides 83, HDL 58, LDL 167.   CMP Latest Ref Rng & Units 06/10/2019 08/26/2018 12/19/2016  Glucose 65 - 99 mg/dL 94 112(H) 97  BUN 6 - 24 mg/dL _0 Creatinine 0.57 - 1.00 mg/dL 0.91 0.85 0.76  Sodium 134 - 144 mmol/L 140 141 135  Potassium 3.5 - 5.2 mmol/L 4.5 4.4 3.6  Chloride 96 - 106 mmol/L 106 107 102  CO2 20 - 29 mmol/L _1 Calcium 8.7 - 10.2 mg/dL 9.6 9.1 9.4  Total Protein 6.5 - 8.1 g/dL - 6.7 7.1  Total Bilirubin 0.3 - 1.2 mg/dL - 0.6 0.9  Alkaline Phos 38 - 126 U/L - 65 75  AST 15 - 41 U/L - 13(L) 17  ALT 0 - 44 U/L - 13 17   CBC Latest Ref Rng & Units 06/10/2019 08/26/2018 12/19/2016  WBC 3.4 - 10.8 x10E3/uL 5.7 12.8(H) 7.3  Hemoglobin 11.1 - 15.9 g/dL 13.0 13.2 12.3  Hematocrit 34.0 - 46.6 % 39.6 39.7 37.9  Platelets 150 - 450 x10E3/uL 249 244 257   Lipid Panel     Component Value Date/Time   CHOL 188 04/05/2015 0746   TRIG 36 04/05/2015 0746   HDL 62 04/05/2015 0746   CHOLHDL 3.0 04/05/2015 0746   VLDL 7 04/05/2015 0746   LDLCALC 119 (H) 04/05/2015 0746   HEMOGLOBIN A1C Lab Results  Component Value Date   HGBA1C 5.7 (H) 04/05/2015   MPG 117 04/05/2015   TSH No results for input(s): TSH in the last 8760 hours.  PRN Meds:. There are no discontinued medications. Current Meds  Medication Sig  . acetaminophen (TYLENOL) 500 MG tablet Take 500 mg by mouth every 6 (six) hours as needed (for pain.).  Marland Kitchen ALPRAZolam (XANAX) 0.5 MG tablet Take 0.5 mg by mouth 2 (two) times daily as needed for anxiety or sleep (panic attacks).   Marland Kitchen amphetamine-dextroamphetamine (ADDERALL) 20 MG tablet Take 20 mg by mouth 3 (three) times daily as needed (adhd).   . dicyclomine (BENTYL) 10 MG capsule Take 20 mg by mouth 3 (three) times daily as needed for spasms.   Marland Kitchen estazolam (PROSOM) 2 MG tablet Take 1-2 mg by mouth at bedtime.   Marland Kitchen FLUoxetine (PROZAC) 20 MG capsule Take 60 mg by mouth daily.  . fluticasone (FLONASE)  50 MCG/ACT nasal spray Place 1-2 sprays into both nostrils daily as needed for allergies.   Marland Kitchen ondansetron (ZOFRAN) 4 MG tablet Take 1 tablet (4 mg total) by mouth every 8 (eight) hours as needed for nausea or vomiting.  . pantoprazole (PROTONIX) 40 MG tablet Take 40 mg by mouth daily.   . pravastatin (PRAVACHOL) 40 MG tablet Take 40 mg by mouth daily.  . pseudoephedrine-acetaminophen (TYLENOL SINUS) 30-500  MG TABS tablet Take 1 tablet by mouth every 4 (four) hours as needed (nasal congestion).   . traZODone (DESYREL) 150 MG tablet Take 75-150 mg by mouth at bedtime as needed for sleep.     Cardiac Studies:   Coronary angiogram 06/24/2019: Normal coronary arteries. Normal LVEF and LV end diastolic pressures.   Echocardiogram 05/19/2019 :  Normal LV systolic function with EF 55%. Left ventricle cavity is normal in size. Mild to moderate concentric hypertrophy of the left ventricle. Normal global wall motion.  E/A 0.82. Diastolic function not completely assessed.  Calculated EF 55%. Left atrial cavity is normal in size. Interatrial septum is intact. Double contrast study negative for intra-cardiac shunt. No late appearance of bubbles also. Mild tricuspid regurgitation. No evidence of pulmonary hypertension.  Exercise Myoview stress test 05/16/2019: Exercise treadmill stress test was performed using Bruce protocol. Patient walked for 7.04 min, achieving 8.6 METS and a maximum heart rate of 138 beats per minute, which is 86% of maximum predicted heart rate. Hemodynamic response was normal. Exercise capacity was normal. No ischemic changes noted on stress electrocardiogram. No sustained stress arrhythmias were noted. Myocardial pefusion imaging is normal. Left ventricular ejection fraction is 72% with normal wall motion. Low risk study.   Assessment:     ICD-10-CM   1. Dyspnea on exertion  R06.09   2. Atypical chest pain  R07.89   3. Malaise and fatigue  R53.81    R53.83   4. Hypercholesteremia  E78.00   5. OSA (obstructive sleep apnea)  G47.33     EKG 05/26/2019: Normal sinus rhythm at 78 bpm, left atrial enlargement, normal axis, no evidence of ischemia.   Recommendations:   I have discussed her recent coronary angiogram findings, normal coronary arteries.  Patient feels frustrated as she is continued to have symptoms of fatigue, chest pain, and dyspnea on exertion.  Since her coronary  angiogram was normal, would look for noncardiac etiology for her symptoms.  I suspect her symptoms may be multifactorial.  Over the last few months she has gained weight, has had worsening depression, has just recently started using CPAP regularly, and also has history of GERD.  She is to have endoscopy in the next few weeks for further evaluation.  She does have risk factors for CAD and I have strongly encouraged her to continue with primary prevention measures with keeping cholesterol well controlled, maintaining stable weight, using CPAP, and if developing high blood pressure treating this.  She is without history of hypertension at this time.  I have encouraged her to continue with Crestor, she has a history of noncompliance with this.  Right radial access site is healing well with only minimal ecchymosis.  No hematoma is present.  As her symptoms are related to noncardiac etiology, she will continue work-up with her PCP for her symptoms and for primary prevention.  I will see her back on a as needed basis.  Encouraged her to contact me for any new concerns or worsening problems.  Miquel Dunn, MSN, APRN, FNP-C [facility]. [place] Office:  [phone] Fax: [phone]

## 2019-07-16 DIAGNOSIS — F3181 Bipolar II disorder: Secondary | ICD-10-CM | POA: Diagnosis not present

## 2019-07-16 DIAGNOSIS — F900 Attention-deficit hyperactivity disorder, predominantly inattentive type: Secondary | ICD-10-CM | POA: Diagnosis not present

## 2019-07-25 ENCOUNTER — Other Ambulatory Visit: Payer: Self-pay | Admitting: Cardiology

## 2019-09-02 DIAGNOSIS — Z1159 Encounter for screening for other viral diseases: Secondary | ICD-10-CM | POA: Diagnosis not present

## 2019-09-05 DIAGNOSIS — K648 Other hemorrhoids: Secondary | ICD-10-CM | POA: Diagnosis not present

## 2019-09-05 DIAGNOSIS — K582 Mixed irritable bowel syndrome: Secondary | ICD-10-CM | POA: Diagnosis not present

## 2019-09-05 DIAGNOSIS — D123 Benign neoplasm of transverse colon: Secondary | ICD-10-CM | POA: Diagnosis not present

## 2019-09-05 DIAGNOSIS — K2210 Ulcer of esophagus without bleeding: Secondary | ICD-10-CM | POA: Diagnosis not present

## 2019-09-05 DIAGNOSIS — K219 Gastro-esophageal reflux disease without esophagitis: Secondary | ICD-10-CM | POA: Diagnosis not present

## 2019-09-05 DIAGNOSIS — K625 Hemorrhage of anus and rectum: Secondary | ICD-10-CM | POA: Diagnosis not present

## 2019-12-03 DIAGNOSIS — L820 Inflamed seborrheic keratosis: Secondary | ICD-10-CM | POA: Diagnosis not present

## 2020-01-05 ENCOUNTER — Other Ambulatory Visit: Payer: Self-pay | Admitting: Cardiology

## 2020-01-05 NOTE — Telephone Encounter (Signed)
Is pt supposed to be on this? Please advise on refill

## 2020-01-08 DIAGNOSIS — Z1231 Encounter for screening mammogram for malignant neoplasm of breast: Secondary | ICD-10-CM | POA: Diagnosis not present

## 2020-01-08 DIAGNOSIS — Z130 Encounter for screening for diseases of the blood and blood-forming organs and certain disorders involving the immune mechanism: Secondary | ICD-10-CM | POA: Diagnosis not present

## 2020-01-08 DIAGNOSIS — Z6832 Body mass index (BMI) 32.0-32.9, adult: Secondary | ICD-10-CM | POA: Diagnosis not present

## 2020-01-08 DIAGNOSIS — Z780 Asymptomatic menopausal state: Secondary | ICD-10-CM | POA: Diagnosis not present

## 2020-01-08 DIAGNOSIS — Z01419 Encounter for gynecological examination (general) (routine) without abnormal findings: Secondary | ICD-10-CM | POA: Diagnosis not present

## 2020-01-28 DIAGNOSIS — N182 Chronic kidney disease, stage 2 (mild): Secondary | ICD-10-CM | POA: Diagnosis not present

## 2020-01-28 DIAGNOSIS — G43909 Migraine, unspecified, not intractable, without status migrainosus: Secondary | ICD-10-CM | POA: Diagnosis not present

## 2020-01-28 DIAGNOSIS — F319 Bipolar disorder, unspecified: Secondary | ICD-10-CM | POA: Diagnosis not present

## 2020-03-02 DIAGNOSIS — Z0000 Encounter for general adult medical examination without abnormal findings: Secondary | ICD-10-CM | POA: Diagnosis not present

## 2020-03-02 DIAGNOSIS — R739 Hyperglycemia, unspecified: Secondary | ICD-10-CM | POA: Diagnosis not present

## 2020-03-02 DIAGNOSIS — E7849 Other hyperlipidemia: Secondary | ICD-10-CM | POA: Diagnosis not present

## 2020-03-08 DIAGNOSIS — F900 Attention-deficit hyperactivity disorder, predominantly inattentive type: Secondary | ICD-10-CM | POA: Diagnosis not present

## 2020-03-08 DIAGNOSIS — N1831 Chronic kidney disease, stage 3a: Secondary | ICD-10-CM | POA: Insufficient documentation

## 2020-03-08 DIAGNOSIS — Z0000 Encounter for general adult medical examination without abnormal findings: Secondary | ICD-10-CM | POA: Diagnosis not present

## 2020-03-08 DIAGNOSIS — F319 Bipolar disorder, unspecified: Secondary | ICD-10-CM | POA: Diagnosis not present

## 2020-03-08 DIAGNOSIS — R413 Other amnesia: Secondary | ICD-10-CM | POA: Diagnosis not present

## 2020-03-08 DIAGNOSIS — Z1331 Encounter for screening for depression: Secondary | ICD-10-CM | POA: Diagnosis not present

## 2020-03-08 DIAGNOSIS — G43909 Migraine, unspecified, not intractable, without status migrainosus: Secondary | ICD-10-CM | POA: Diagnosis not present

## 2020-04-08 DIAGNOSIS — L718 Other rosacea: Secondary | ICD-10-CM | POA: Diagnosis not present

## 2020-04-08 DIAGNOSIS — D2271 Melanocytic nevi of right lower limb, including hip: Secondary | ICD-10-CM | POA: Diagnosis not present

## 2020-04-08 DIAGNOSIS — L905 Scar conditions and fibrosis of skin: Secondary | ICD-10-CM | POA: Diagnosis not present

## 2020-04-08 DIAGNOSIS — D225 Melanocytic nevi of trunk: Secondary | ICD-10-CM | POA: Diagnosis not present

## 2020-05-09 ENCOUNTER — Other Ambulatory Visit: Payer: Self-pay | Admitting: Cardiology

## 2020-05-10 DIAGNOSIS — F900 Attention-deficit hyperactivity disorder, predominantly inattentive type: Secondary | ICD-10-CM | POA: Diagnosis not present

## 2020-05-10 DIAGNOSIS — F3181 Bipolar II disorder: Secondary | ICD-10-CM | POA: Diagnosis not present

## 2020-06-24 DIAGNOSIS — J020 Streptococcal pharyngitis: Secondary | ICD-10-CM | POA: Insufficient documentation

## 2020-06-24 DIAGNOSIS — R05 Cough: Secondary | ICD-10-CM | POA: Diagnosis not present

## 2020-06-24 DIAGNOSIS — Z1152 Encounter for screening for COVID-19: Secondary | ICD-10-CM | POA: Diagnosis not present

## 2020-06-24 DIAGNOSIS — J029 Acute pharyngitis, unspecified: Secondary | ICD-10-CM | POA: Insufficient documentation

## 2020-10-05 DIAGNOSIS — R350 Frequency of micturition: Secondary | ICD-10-CM | POA: Diagnosis not present

## 2020-11-04 DIAGNOSIS — F3181 Bipolar II disorder: Secondary | ICD-10-CM | POA: Diagnosis not present

## 2020-11-04 DIAGNOSIS — F900 Attention-deficit hyperactivity disorder, predominantly inattentive type: Secondary | ICD-10-CM | POA: Diagnosis not present

## 2020-12-16 ENCOUNTER — Encounter: Payer: Self-pay | Admitting: Podiatry

## 2020-12-16 ENCOUNTER — Other Ambulatory Visit: Payer: Self-pay

## 2020-12-16 ENCOUNTER — Ambulatory Visit (INDEPENDENT_AMBULATORY_CARE_PROVIDER_SITE_OTHER): Payer: BC Managed Care – PPO

## 2020-12-16 ENCOUNTER — Ambulatory Visit (INDEPENDENT_AMBULATORY_CARE_PROVIDER_SITE_OTHER): Payer: BC Managed Care – PPO | Admitting: Podiatry

## 2020-12-16 DIAGNOSIS — M722 Plantar fascial fibromatosis: Secondary | ICD-10-CM

## 2020-12-16 DIAGNOSIS — M729 Fibroblastic disorder, unspecified: Secondary | ICD-10-CM

## 2020-12-16 MED ORDER — TRIAMCINOLONE ACETONIDE 40 MG/ML IJ SUSP
20.0000 mg | Freq: Once | INTRAMUSCULAR | Status: AC
  Administered 2020-12-16: 20 mg

## 2020-12-16 MED ORDER — MELOXICAM 15 MG PO TABS: 15.0000 mg | ORAL_TABLET | Freq: Every day | ORAL | 3 refills | Status: DC

## 2020-12-16 MED ORDER — METHYLPREDNISOLONE 4 MG PO TBPK: ORAL_TABLET | ORAL | 0 refills | Status: DC

## 2020-12-16 NOTE — Progress Notes (Signed)
Subjective:  [name], female    DOB: Jan 28, 1961,  MRN: [id number] HPI Chief Complaint  Patient presents with  . Foot Pain    Plantar heel right - aching x 6 weeks, AM pain, tried stretching and wearing good sneakers, trips recently and did lots of walking  . New Patient (Initial Visit)    Est pt 07/2017    59 y.o. female presents with the above complaint.   ROS: Denies fever chills nausea vomiting muscle aches pains calf pain back pain chest pain shortness of breath.  Past Medical History:  Diagnosis Date  . ADD (attention deficit disorder)   . Allergy    takes Allegra D daily prn allergies and Flonase prn allergies  . Anxiety    takes Xanax prn anxiety  . Arthritis   . Bipolar 2 disorder ([place])    takes Adderall daily  . 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 Chronic low back pain   . Depression    takes Prozac daily  . Dizziness   . Hearing impaired    deaf in right ear  . Hiatal hernia   . History of fracture of nasal bone 07/01/2007  . History of migraine    last one on 10/09/13  . Hyperlipidemia   . IBS (irritable bowel syndrome)   . Insomnia    takes Trazodone nightly prn sleep  . Laryngitis   . LVH (left ventricular hypertrophy) 04/05/2015   Mild, Noted on ECHO  . Muscle spasms of neck    takes Flexeril daily prn muscle spasms  . OSA on CPAP 05/14/2014   resolved after weight loss  . Pleurisy    when in college  . Pneumonia    walking pneumonia in college  . PONV (postoperative nausea and vomiting)   . Right ureteral stone    4 x 3 mm stone at the right ureterovesical junction   . Shortness of breath    with exertion/sitting  . TIA (transient ischemic attack) 03/2015  . Toe fracture, right 04/2017   great toe  . Urinary frequency   . Viral infection    09/25/13-was given an inhaler d/t SOB;was on ZPak  . Weakness    both hands   Past Surgical History:  Procedure Laterality Date  . ABDOMINAL HERNIA REPAIR    . ABDOMINAL  HYSTERECTOMY    . bladder  stretch    . BUNIONECTOMY    . COCHLEAR IMPLANT Right   . COLONOSCOPY    . cyst removed from left arm      as a child  . ELBOW FRACTURE SURGERY Right   . fx nose repair    . LEFT HEART CATH AND CORONARY ANGIOGRAPHY N/A 06/24/2019   Procedure: LEFT HEART CATH AND CORONARY ANGIOGRAPHY;  Surgeon: Natalie Prows, MD;  Location: [facility];  Service: Cardiovascular;  Laterality: N/A;  . NECK SURGERY  12/2015  . 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 UPPER GASTROINTESTINAL ENDOSCOPY      Current Outpatient Medications:  .  meloxicam (MOBIC) 15 MG tablet, Take 1 tablet (15 mg total) by mouth daily., Disp: 30 tablet, Rfl: 3 .  methylPREDNISolone (MEDROL DOSEPAK) 4 MG TBPK tablet, 6 day dose pack - take as directed, Disp: 21 tablet, Rfl: 0 .  acetaminophen (TYLENOL) 500 MG tablet, Take 500 mg by mouth every 6 (six) hours as needed (for pain.)., Disp: , Rfl:  .  ALPRAZolam (XANAX) 0.5 MG tablet, Take  0.5 mg by mouth 2 (two) times daily as needed for anxiety or sleep (panic attacks). , Disp: , Rfl: 1 .  amphetamine-dextroamphetamine (ADDERALL) 20 MG tablet, Take 20 mg by mouth 3 (three) times daily as needed (adhd). , Disp: , Rfl:  .  dicyclomine (BENTYL) 10 MG capsule, Take 20 mg by mouth 3 (three) times daily as needed for spasms. , Disp: , Rfl: 1 .  estazolam (PROSOM) 2 MG tablet, Take 1-2 mg by mouth at bedtime. , Disp: , Rfl:  .  FLUoxetine (PROZAC) 20 MG capsule, Take 60 mg by mouth daily., Disp: , Rfl:  .  fluticasone (FLONASE) 50 MCG/ACT nasal spray, Place 1-2 sprays into both nostrils daily as needed for allergies. , Disp: , Rfl:  .  ondansetron (ZOFRAN) 4 MG tablet, Take 1 tablet (4 mg total) by mouth every 8 (eight) hours as needed for nausea or vomiting., Disp: 6 tablet, Rfl: 0 .  pantoprazole (PROTONIX) 40 MG tablet, Take 40 mg by mouth daily. , Disp: , Rfl: 1 .  pravastatin (PRAVACHOL) 40 MG tablet, Take 40 mg by mouth daily., Disp: , Rfl:  .   traZODone (DESYREL) 150 MG tablet, Take 75-150 mg by mouth at bedtime as needed for sleep. , Disp: , Rfl: 1  Allergies  Allergen Reactions  . Demerol Nausea And Vomiting  . Topamax [Topiramate] Other (See Comments)    Fluctuating blood pressure, increased heart rate  . Lamictal [Lamotrigine] Rash   Review of Systems Objective:  There were no vitals filed for this visit.  General: Well developed, nourished, in no acute distress, alert and oriented x3   Dermatological: Skin is warm, dry and supple bilateral. Nails x 10 are well maintained; remaining integument appears unremarkable at this time. There are no open sores, no preulcerative lesions, no rash or signs of infection present.  Vascular: Dorsalis Pedis artery and Posterior Tibial artery pedal pulses are 2/4 bilateral with immedate capillary fill time. Pedal hair growth present. No varicosities and no lower extremity edema present bilateral.   Neruologic: Grossly intact via light touch bilateral. Vibratory intact via tuning fork bilateral. Protective threshold with Semmes Wienstein monofilament intact to all pedal sites bilateral. Patellar and Achilles deep tendon reflexes 2+ bilateral. No Babinski or clonus noted bilateral.   Musculoskeletal: No gross boney pedal deformities bilateral. No pain, crepitus, or limitation noted with foot and ankle range of motion bilateral. Muscular strength 5/5 in all groups tested bilateral.  Pain on palpation medial calcaneal tubercle of the right heel.  Gait: Unassisted, Nonantalgic.    Radiographs:  Radiographs taken today demonstrated rectus foot soft tissue increase in density the plantar fashion calcaneal insertion site healed fracture hallux.  Assessment & Plan:   Assessment: Plantar fasciitis right.  Plan: Discussed etiology pathology conservative surgical therapies injected the right heel today 20 mg Kenalog 5 mg Marcaine for maximal tenderness.  Start her on a Medrol Dosepak to be  followed by meloxicam.  Placed on plantar fascial brace discussed appropriate shoe gear stretching exercises and shoe gear modifications.  I will follow-up with her in 1 month     Marico Buckle T. [place], [place]

## 2020-12-16 NOTE — Patient Instructions (Signed)

## 2020-12-20 ENCOUNTER — Other Ambulatory Visit: Payer: Self-pay | Admitting: Podiatry

## 2020-12-20 DIAGNOSIS — M722 Plantar fascial fibromatosis: Secondary | ICD-10-CM

## 2021-01-20 ENCOUNTER — Ambulatory Visit: Payer: BC Managed Care – PPO | Admitting: Podiatry

## 2021-01-31 DIAGNOSIS — R1310 Dysphagia, unspecified: Secondary | ICD-10-CM | POA: Diagnosis not present

## 2021-02-02 DIAGNOSIS — K582 Mixed irritable bowel syndrome: Secondary | ICD-10-CM | POA: Diagnosis not present

## 2021-02-02 DIAGNOSIS — K219 Gastro-esophageal reflux disease without esophagitis: Secondary | ICD-10-CM | POA: Diagnosis not present

## 2021-02-02 DIAGNOSIS — R499 Unspecified voice and resonance disorder: Secondary | ICD-10-CM | POA: Diagnosis not present

## 2021-02-02 DIAGNOSIS — R070 Pain in throat: Secondary | ICD-10-CM | POA: Diagnosis not present

## 2021-02-08 DIAGNOSIS — R1310 Dysphagia, unspecified: Secondary | ICD-10-CM | POA: Diagnosis not present

## 2021-02-08 DIAGNOSIS — J3489 Other specified disorders of nose and nasal sinuses: Secondary | ICD-10-CM | POA: Diagnosis not present

## 2021-02-08 DIAGNOSIS — K219 Gastro-esophageal reflux disease without esophagitis: Secondary | ICD-10-CM | POA: Diagnosis not present

## 2021-02-08 DIAGNOSIS — R490 Dysphonia: Secondary | ICD-10-CM | POA: Diagnosis not present

## 2021-02-08 DIAGNOSIS — R070 Pain in throat: Secondary | ICD-10-CM | POA: Insufficient documentation

## 2021-03-09 DIAGNOSIS — E785 Hyperlipidemia, unspecified: Secondary | ICD-10-CM | POA: Diagnosis not present

## 2021-04-11 ENCOUNTER — Encounter: Payer: Self-pay | Admitting: Gastroenterology

## 2021-04-19 DIAGNOSIS — E785 Hyperlipidemia, unspecified: Secondary | ICD-10-CM | POA: Diagnosis not present

## 2021-04-19 DIAGNOSIS — Z0000 Encounter for general adult medical examination without abnormal findings: Secondary | ICD-10-CM | POA: Diagnosis not present

## 2021-04-30 ENCOUNTER — Other Ambulatory Visit: Payer: Self-pay | Admitting: Podiatry

## 2021-05-02 NOTE — Telephone Encounter (Signed)
Please advise 

## 2021-05-31 DIAGNOSIS — J069 Acute upper respiratory infection, unspecified: Secondary | ICD-10-CM | POA: Diagnosis not present

## 2021-05-31 DIAGNOSIS — Z1152 Encounter for screening for COVID-19: Secondary | ICD-10-CM | POA: Diagnosis not present

## 2021-06-08 DIAGNOSIS — H903 Sensorineural hearing loss, bilateral: Secondary | ICD-10-CM | POA: Diagnosis not present

## 2021-06-09 DIAGNOSIS — H6122 Impacted cerumen, left ear: Secondary | ICD-10-CM | POA: Insufficient documentation

## 2021-06-09 DIAGNOSIS — H903 Sensorineural hearing loss, bilateral: Secondary | ICD-10-CM | POA: Diagnosis not present

## 2021-06-09 DIAGNOSIS — Z9889 Other specified postprocedural states: Secondary | ICD-10-CM | POA: Diagnosis not present

## 2021-06-09 DIAGNOSIS — Z9621 Cochlear implant status: Secondary | ICD-10-CM | POA: Diagnosis not present

## 2021-06-29 DIAGNOSIS — L821 Other seborrheic keratosis: Secondary | ICD-10-CM | POA: Diagnosis not present

## 2021-06-29 DIAGNOSIS — L570 Actinic keratosis: Secondary | ICD-10-CM | POA: Diagnosis not present

## 2021-06-29 DIAGNOSIS — L814 Other melanin hyperpigmentation: Secondary | ICD-10-CM | POA: Diagnosis not present

## 2021-06-29 DIAGNOSIS — L820 Inflamed seborrheic keratosis: Secondary | ICD-10-CM | POA: Diagnosis not present

## 2021-06-29 DIAGNOSIS — D2371 Other benign neoplasm of skin of right lower limb, including hip: Secondary | ICD-10-CM | POA: Diagnosis not present

## 2021-06-29 DIAGNOSIS — D1801 Hemangioma of skin and subcutaneous tissue: Secondary | ICD-10-CM | POA: Diagnosis not present

## 2021-07-25 ENCOUNTER — Encounter: Payer: Self-pay | Admitting: *Deleted

## 2021-07-26 ENCOUNTER — Encounter: Payer: Self-pay | Admitting: Neurology

## 2021-07-26 ENCOUNTER — Other Ambulatory Visit: Payer: Self-pay

## 2021-07-26 ENCOUNTER — Ambulatory Visit (INDEPENDENT_AMBULATORY_CARE_PROVIDER_SITE_OTHER): Payer: BC Managed Care – PPO | Admitting: Neurology

## 2021-07-26 VITALS — BP 128/87 | HR 71 | Ht 67.0 in | Wt 196.0 lb

## 2021-07-26 DIAGNOSIS — R519 Headache, unspecified: Secondary | ICD-10-CM | POA: Diagnosis not present

## 2021-07-26 DIAGNOSIS — R635 Abnormal weight gain: Secondary | ICD-10-CM

## 2021-07-26 DIAGNOSIS — G4733 Obstructive sleep apnea (adult) (pediatric): Secondary | ICD-10-CM | POA: Diagnosis not present

## 2021-07-26 DIAGNOSIS — Z789 Other specified health status: Secondary | ICD-10-CM

## 2021-07-26 DIAGNOSIS — G4700 Insomnia, unspecified: Secondary | ICD-10-CM | POA: Diagnosis not present

## 2021-07-26 DIAGNOSIS — R351 Nocturia: Secondary | ICD-10-CM | POA: Diagnosis not present

## 2021-07-26 DIAGNOSIS — Z9989 Dependence on other enabling machines and devices: Secondary | ICD-10-CM

## 2021-07-26 DIAGNOSIS — E669 Obesity, unspecified: Secondary | ICD-10-CM

## 2021-07-26 NOTE — Progress Notes (Signed)
Subjective:    [name] is a 60 y.o. female.  HPI    Star Age, MD, PhD [facility] [address]O. [place], [place] [address]  Dear Dr. Philip Aspen,   I saw your patient, Natalie Le, upon your kind request, in my sleep clinic today for re-evaluation of her obstructive sleep apnea.  The patient is unaccompanied today.  As you know, Natalie Le is a 60 year old right-handed woman with an underlying medical history of reflux disease, ADD, irritable bowel syndrome, migraine headaches, interstitial cystitis, chest pain, depression, low back pain, hearing loss, history of upper GI bleed, kidney stone, and borderline obesity, who reports difficulty using her CPAP machine.  She has discomfort with the mask.  She has a tendency to pull the mask off in the middle of the night.  She did not bring her machine today and a download is not available for review.  She reports that she has an older machine and that the humidifier recently stopped working.  This causes additional discomfort.  In the past, she had consulted with her dentist about an oral appliance but she is not sure if this was a bite guard or an actual treatment appliance for sleep apnea.  She reports that the cost was too high and she did not pursue it at the time.  She has a friend who has the inspire implanted device, she would be open to considering other treatment options.  She has had some weight fluctuation.  She was treated with steroids recently and had some weight gain.  She is currently working on weight loss.  She has a variable sleep schedule, tries to be in bed generally between 9 and 10, she may go to bed even earlier around 7 PM at times.  She has a rise time between 4 AM and 7 AM, typically around 630 on most mornings.  She has nocturia about once or twice per average night and has woken up with a headache.  She reports a history of migraines.  She was recently given a  prescription through your office for [place] but reports that she could not fill the prescription because of cost.  She takes trazodone at night.  She sees behavioral health.  She recently ran out of her prescription for ProSom and Adderall. I reviewed your office note from 08/09/2021.  I had evaluated her over 4 years ago for sleep apnea.  She was on CPAP therapy at the time.  A sleep study from 2015 had shown an AHI of 52/h, O2 nadir 89%.  A home sleep test from 07/04/2017 had shown an AHI of 1.8/h, O2 nadir 83% with time below 88% saturation of 1 minute.  Mild and intermittent snoring was detected. Her Epworth sleepiness score is 4 out of 24, fatigue severity score is 61 out of 63.  She has noticed intermittent dizziness in the past couple of weeks.  She has not contacted your office for the symptoms yet.    Previously:   03/26/2017: 60 year old right-handed woman with an underlying medical history of ADD, hiatal hernia, irritable bowel syndrome, migraine headaches, chronic low back pain, insomnia, deafness R ear, migraine headaches, and hearing loss, and bipolar disorder, who presents for follow-up consultation of her OSA, episode of word finding difficulty. I last saw her on 04/26/2016 at which time she reported confusion and episodic word finding difficulty. I suggested we proceed with an EEG. She did not have it done. She was  adequately compliant with her CPAP at the time. She had recent neck surgery under Dr. Sherwood Gambler in January 2017.   I reviewed her CPAP compliance data from 02/20/2017 through 03/21/2017, which is a total of 30 days, during which time she used her machine 20 days with percent used days greater than 4 hours at 50%, indicating suboptimal compliance with an average usage of 6 hours and 6 minutes, residual AHI 7.3 per hour, leak on the high side for the 95th percentile at 42.9 L/m on a pressure of 10 cm with EPR of 1. She reports that she does not like to use her CPAP. Of note, she has  lost weight. At the time of her sleep study she weighed almost 30 pounds more. 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 She travels for work, but took it on a cruise even, but finds it hard to use, FFM causes dry mouth. She has been struggling with it for a long time. She decided not to pursue with EEG. She feels stable otherwise.   04/26/2016: She reports getting easily jumbled with her words and confused, sometimes disoriented, no seizures as in convulsions. I reviewed her CPAP compliance data from 03/26/2016 through 04/24/2016 which is a total of 30 days during which time she used her machine 27 days with percent used days greater than 4 hours at 70%, indicating adequate compliance with an average usage of 5 hours and 57 minutes, residual AHI suboptimal at 12 per hour, obstructive apnea index appears to be 5 per hour, central apnea index appears to be 2.6 per hour, pressure at 9 cm. Leak at times high with the 95th percentile at 29.8 L/m.    She had neck surgery under Dr. Sherwood Gambler in 1/17 and has been doing okay, healed okay, back to work. Has short term memory issues, and has a FHx of AD in mother, who lived to be 13, MA had PD, who passed away at 69, MGM had brain aneurysms, lived to be 62.    I reviewed your office note from 04/19/16, which you kindly included. She reported intermittent aphasia, confusion, an episode of facial droop in the past week. She had had more stressors, she and her husband separated this year. She reported a recent tick bite. You treated her with doxycycline 100 mg twice daily for 10 days. You also ordered vitamin D level and B12 level at the time, also TSH, free T4, CBC and BMP. I reviewed lab test results from your office, vitamin B-12 was 531, BMP unremarkable, TSH normal, free T4 normal, CBC unremarkable.    Of note, in the interim, she was admitted to the hospital on 04/04/2015 for concern for TIA, including speech difficulty, and left lower extremity weakness. Workup included  head CT without contrast, brain MRI, and echocardiogram. Workup was negative for stroke. No additional testing or stroke follow-up was recommended at the time. She did have a neurological consultation during her stay. She was discharged on 04/05/2015.   She had a more recent brain MRI with and without contrast on 04/19/2016, secondary to speech impairment and confusion:  IMPRESSION: 1. Single prominent subcortical T2 hyperintensity in the right frontal operculum. The finding is nonspecific but can be seen in the setting of chronic microvascular ischemia, a demyelinating process such as multiple sclerosis, vasculitis, complicated migraine headaches, or as the sequelae of a prior infectious or inflammatory process. 2. Otherwise normal MRI of the brain for age.   In addition, I personally reviewed the images through  the PACS system.    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 She was being followed at [facility]. She reported some cold and congestion symptoms which made it difficult for her to be fully compliant with CPAP therapy at times. Overall, she was sleeping fairly well with it.    I saw her on 05/14/2014, at which time she reported sleeping better, and she felt her memory was better. She was still struggling with sleep maintenance and sleep onset issues. I suggested she try melatonin, 3-5 mg.    I reviewed her compliance data from 10/11/2014 through 11/09/2014 which is a total of 30 days during which time she used her machine 26 days. Percent used days greater than 4 hours was 73%, indicating adequate compliance, average usage of 5 hours and 35 minutes. Residual AHI at 2.8 per hour and leak  was acceptable. Pressure at 9 cm with EPR of 1.   I first met her on 03/17/2014 at the request of her primary care physician, at which time she reported memory problems particularly with short-term memory issues and lapses in memory in the context of poor quality sleep. We talked about her prior sleep study from 2009 reviewed the test results. She had at the time no significant obstructive sleep apnea. She also had a sleep study when she was in [place] some 11 or 12 years ago which per her report had shown some sleep apnea. She reported weight gain after her hysterectomy 4 years ago. She reported frequent morning headaches, nonrestorative sleep and difficulty with sleep maintenance as well as excessive daytime somnolence. I invited her back for sleep study.    She had a split-night sleep study on 03/18/2014. Baseline sleep efficiency was 86.2% with a latency to sleep of 6.5 minutes and wake after sleep onset of 14.5 minutes with moderate to severe sleep fragmentation noted. She had a elevated arousal index. She had increased percentages of stage I and stage II sleep, a normal percentage of slow-wave sleep, and absence of REM sleep. She had no significant EKG changes. She had no significant periodic leg movements of sleep. She had mild to moderate and rare loud snoring. She had a total AHI of 52 per hour. Baseline oxygen saturation was 95%, nadir was 89%. She was therefore titrated on CPAP from 5-9 cm. Sleep efficiency during the second portion of the study was increased at 93.1%. Arousal index was normal. She had an increased percentage of stage I and stage II sleep, a very small percentage of slow-wave sleep and 17.5% of REM sleep. Average oxygen saturation was 96%, nadir was 93%. She felt that she had slept about the same as usual but did not wake up with a headache which was better. I reviewed her CPAP compliance data from 04/01/2014 through 04/30/2014 which is 30 days during which time she uses CPAP every night  except for one night. Percent used days greater than 4 hours was 77%, indicating good compliance. Residual AHI was 2.9 per hour indicating inadequate pressure of 9 cm with EPR of 1. Average usage for all night sweats 5 hours and 21 minutes. Leak was low with the 95th percentile at 13.5 L per minute.   I reviewed  her compliance data from 03/31/2014 through 05/13/2014 which is a total of 44 days during which time she used CPAP every night except for 2 nights. Percent used days greater than 4 hours was 70%, indicating adequate compliance. Average usage for all nights was 5 hours and 3 minutes. Residual AHI acceptable at 2.6 per hour on a pressure of 9 cm with EPR of 1, leak was low with 13.4 L per minute at the 95th percentile.  Her Past Medical History Is Significant For: Past Medical History:  Diagnosis Date   ADD (attention deficit disorder)    Allergy    takes Allegra D daily prn allergies and Flonase prn allergies   Anxiety    takes Xanax prn anxiety   Arthritis    Bipolar 2 disorder (HCC)    takes Adderall daily   Chronic deafness in right ear    Chronic low back pain    Depression    takes Prozac daily   Dizziness    Hearing impaired    deaf in right ear   Hiatal hernia    History of fracture of nasal bone 07/01/2007   History of migraine    last one on 10/09/13   Hyperlipidemia    IBS (irritable bowel syndrome)    Insomnia    takes Trazodone nightly prn sleep   Laryngitis    LVH (left ventricular hypertrophy) 04/05/2015   Mild, Noted on ECHO   Muscle spasms of neck    takes Flexeril daily prn muscle spasms   OSA on CPAP 05/14/2014   resolved after weight loss   Pleurisy    when in college   Pneumonia    walking pneumonia in college   PONV (postoperative nausea and vomiting)    Right ureteral stone    4 x 3 mm stone at the right ureterovesical junction    Shortness of breath    with exertion/sitting   TIA (transient ischemic attack) 03/2015   Toe fracture, right  04/2017   great toe   Urinary frequency    Viral infection    09/25/13-was given an inhaler d/t SOB;was on ZPak   Weakness    both hands    Her Past Surgical History Is Significant For: Past Surgical History:  Procedure Laterality Date   ABDOMINAL HERNIA REPAIR     ABDOMINAL HYSTERECTOMY     bladder  stretch     BUNIONECTOMY     COCHLEAR IMPLANT Right    COLONOSCOPY     cyst removed from left arm      as a child   ELBOW FRACTURE SURGERY Right    fx nose repair     LEFT HEART CATH AND CORONARY ANGIOGRAPHY N/A 06/24/2019   Procedure: LEFT HEART CATH AND CORONARY ANGIOGRAPHY;  Surgeon: Adrian Prows, MD;  Location: [facility];  Service: Cardiovascular;  Laterality: N/A;   NECK SURGERY  12/2015   tumor removed from left breast  17yr ago   UPPER GASTROINTESTINAL ENDOSCOPY      Her Family History Is Significant For: Family History  Problem Relation Age of Onset   Colon cancer Maternal Grandfather    Hypertension Maternal Grandfather    Hyperlipidemia Maternal Grandfather    Diabetes Maternal Grandfather    Alzheimer's disease Mother    Hypertension Mother    Diabetes Mother    Hypertension Father    Heart Problems Father        quadruple bypass   Hyperlipidemia Father    Diabetes  Sister    Hypertension Sister    Hyperlipidemia Sister    Hyperlipidemia Brother    Hypertension Brother    Anuerysm Maternal Grandmother    Diabetes Paternal Grandmother    Hypertension Paternal Grandmother    Hyperlipidemia Paternal Grandmother    Lung cancer Paternal Grandfather     Her Social History Is Significant For: Social History   Socioeconomic History   Marital status: Single    Spouse name: Not on file   Number of children: 0   Years of education: college   Highest education level: Not on file  Occupational History    Employer: [employer]    Comment: Sales  Tobacco Use   Smoking status: Never   Smokeless tobacco: Never  Vaping Use   Vaping Use: Never used   Substance and Sexual Activity   Alcohol use: Yes    Alcohol/week: 1.0 standard drink    Types: 1 Glasses of wine per week    Comment: rarely   Drug use: No   Sexual activity: Yes    Birth control/protection: Surgical  Other Topics Concern   Not on file  Social History Narrative   Patient lives at home with her boyfriend.   Patient works full time Press photographer.   Education college degree   Right handed   Caffeine one cup of coffee daily sometimes tea.   Social Determinants of Health   Financial Resource Strain: Not on file  Food Insecurity: Not on file  Transportation Needs: Not on file  Physical Activity: Not on file  Stress: Not on file  Social Connections: Not on file    Her Allergies Are:  Allergies  Allergen Reactions   Codeine    Demerol Nausea And Vomiting   Rosuvastatin    Topamax [Topiramate] Other (See Comments)    Fluctuating blood pressure, increased heart rate   Lamictal [Lamotrigine] Rash  :   Her Current Medications Are:  Outpatient Encounter Medications as of 07/26/2021  Medication Sig   ALPRAZolam (XANAX) 0.5 MG tablet Take 0.5 mg by mouth 2 (two) times daily as needed for anxiety or sleep (panic attacks).    amphetamine-dextroamphetamine (ADDERALL) 20 MG tablet Take 20 mg by mouth 3 (three) times daily as needed (adhd).    dicyclomine (BENTYL) 10 MG capsule Take 20 mg by mouth 3 (three) times daily as needed for spasms.    estazolam (PROSOM) 2 MG tablet Take 1-2 mg by mouth at bedtime.    FLUoxetine (PROZAC) 20 MG capsule Take 60 mg by mouth daily.   fluticasone (FLONASE) 50 MCG/ACT nasal spray Place 1-2 sprays into both nostrils daily as needed for allergies.    meloxicam (MOBIC) 15 MG tablet TAKE 1 TABLET (15 MG TOTAL) BY MOUTH DAILY.   pantoprazole (PROTONIX) 40 MG tablet Take 40 mg by mouth daily.    pravastatin (PRAVACHOL) 40 MG tablet Take 40 mg by mouth daily.   traZODone (DESYREL) 150 MG tablet Take 75-150 mg by mouth at bedtime as needed for sleep.     acetaminophen (TYLENOL) 500 MG tablet Take 500 mg by mouth every 6 (six) hours as needed (for pain.). (Patient not taking: Reported on 07/26/2021)   [DISCONTINUED] methylPREDNISolone (MEDROL DOSEPAK) 4 MG TBPK tablet 6 day dose pack - take as directed   [DISCONTINUED] ondansetron (ZOFRAN) 4 MG tablet Take 1 tablet (4 mg total) by mouth every 8 (eight) hours as needed for nausea or vomiting.   No facility-administered encounter medications on file as of 07/26/2021.  :  Review of Systems:  Out of a complete 14 point review of systems, all are reviewed and negative with the exception of these symptoms as listed below:  Review of Systems  Neurological:        Patient reports that other than her CPAP no longer working, she is also having severe dizziness and migraines. This past Friday it was so severe that she was also vomiting. She reports that the humidifier in her CPAP is not working and it causes her mouth to get very dry.  Epworth Sleepiness Scale 0= would never doze 1= slight chance of dozing 2= moderate chance of dozing 3= high chance of dozing  Sitting and reading: 1 Watching TV: 1 Sitting inactive in a public place (ex. Theater or meeting): 1 As a passenger in a car for an hour without a break: 1 Lying down to rest in the afternoon: 0 Sitting and talking to someone: 0 Sitting quietly after lunch (no alcohol): 0 In a car, while stopped in traffic: 0 Total: 4    Objective:  Neurological Exam  Physical Exam Physical Examination:   Vitals:   07/26/21 0913  BP: 128/87  Pulse: 71    General Examination: The patient is a very pleasant 60 y.o. female in no acute distress. She appears well-developed and well-nourished and well groomed.   HEENT: Normocephalic, atraumatic, pupils are equal, round and reactive to light, extraocular tracking is well-preserved, hearing is impaired.  Speech is clear without dysarthria.  Airway examination reveals mild to moderate mouth dryness.   Mild to moderate airway crowding noted.  Neck circumference of 15-1/2 inches.  Mild overbite.  Chest: Clear to auscultation without wheezing, rhonchi or crackles noted.   Heart: S1+S2+0, regular and normal without murmurs, rubs or gallops noted.   Abdomen: Soft, non-tender and non-distended with normal bowel sounds appreciated on auscultation.   Extremities: There is no pitting edema in the distal lower extremities bilaterally.    Skin: Warm and dry without trophic changes noted.   Musculoskeletal: exam reveals no obvious joint deformities.   Neurologically: Mental status: The patient is awake, alert and oriented in all 4 spheres. Her immediate and remote memory, attention, language skills and fund of knowledge are appropriate. There is no evidence of aphasia, agnosia, apraxia or anomia. Speech is clear with normal prosody and enunciation. Thought process is linear. Mood is normal and affect is normal.    Cranial nerves II - XII are as described above under HEENT exam.  Motor exam: Normal bulk, strength and tone is noted. There is no tremor.  Romberg is negative, no orthostatic lightheadedness. Fine motor skills and coordination: intact grossly.   Cerebellar testing: No dysmetria or intention tremor. There is no truncal or gait ataxia. Sensory exam: intact to light touch in the upper and lower extremities. Gait, station and balance: She stands with no difficulty. No veering to one side is noted. No leaning to one side is noted. Posture is age-appropriate and stance is narrow based. Gait shows normal stride length and normal pace. No problems turning are noted. Tandem walk is unremarkable.             Assessment and Plan:  In summary, Natalie Le is a 60 year old female with an underlying medical history of reflux disease, ADD, irritable bowel syndrome, migraine headaches, interstitial cystitis, chest pain, depression, low back pain, hearing loss, history of upper GI bleed, kidney stone,  and borderline obesity, who presents for evaluation of her obstructive sleep apnea.  She has been on CPAP therapy.  She has had trouble tolerating the mask and has a tendency to take the mask off in the middle of the night.  Recently, her humidifier has stopped working and she has an older machine.  She is advised to proceed with reevaluation.  She would prefer an overnight laboratory attended sleep study.  She had a home sleep test in 2018 which did not indicate any significant sleep apnea.  Sleep apnea was severe by number of events in April 2015.  She has had some weight fluctuation as well.  We talked about alternative treatment options including a dental device versus a implantable surgical device called inspire.  She would be open to exploring other options.  She is advised to proceed with a sleep study for reevaluation and we will pick up our discussion about treatment options.  She may be eligible for a new machine as well.  I plan to see her back after testing and we will keep her posted as to the results by phone call as well.  She is advised to talk to your office about her new symptoms of dizziness.  She may benefit from seeing ENT.  She has a history of migraines and has a prescription for Aimovig per your office records.  She reports that she could not fill it due to cost but she is advised that there may be an option of using a co-pay card.  She is advised to talk to your office again about her prescription. I answered all her questions today and she was in agreement with the plan. Thank you very much for allowing me to participate in the care of this patient.  Sincerely,  Star Age, MD, PhD

## 2021-07-26 NOTE — Patient Instructions (Addendum)
It was nice to see you again today!   Here is what we discussed today and what we came up with as our plan for you:    Based on your symptoms and your exam I believe you are still at risk for obstructive sleep apnea and would benefit from re-evaluation as it has been several years and you may qualify for a new CPAP machine.  We can also discuss alternative treatment options as you have had trouble tolerating your CPAP in the mask.  You may be a candidate for an oral appliance or an implantable device called inspire.  We will pick up our discussion after testing.    Your primary care physician had prescribed Aimovig injections for migraines.  Your increase in headaches may tie in with undertreated sleep apnea.  Please talk to Dr. Philip Aspen about not being able to fill the prescription for Aimovig injections due to cost.  You may be eligible for a co-pay card and that typically brings down the cost to an affordable price for most patients.  Please also talk to your primary care physician about your recent dizziness.  You may need further work-up and cardiac examination, you may benefit from seeing an ENT specialist for inner ear trouble.  You have had hearing loss in the recent past.

## 2021-08-15 DIAGNOSIS — G43909 Migraine, unspecified, not intractable, without status migrainosus: Secondary | ICD-10-CM | POA: Diagnosis not present

## 2021-08-15 DIAGNOSIS — R42 Dizziness and giddiness: Secondary | ICD-10-CM | POA: Diagnosis not present

## 2021-08-15 DIAGNOSIS — R0789 Other chest pain: Secondary | ICD-10-CM | POA: Diagnosis not present

## 2021-09-06 ENCOUNTER — Ambulatory Visit (INDEPENDENT_AMBULATORY_CARE_PROVIDER_SITE_OTHER): Payer: BC Managed Care – PPO | Admitting: Podiatry

## 2021-09-06 ENCOUNTER — Other Ambulatory Visit: Payer: Self-pay

## 2021-09-06 DIAGNOSIS — G5761 Lesion of plantar nerve, right lower limb: Secondary | ICD-10-CM | POA: Diagnosis not present

## 2021-09-06 DIAGNOSIS — G5760 Lesion of plantar nerve, unspecified lower limb: Secondary | ICD-10-CM

## 2021-09-06 DIAGNOSIS — G5780 Other specified mononeuropathies of unspecified lower limb: Secondary | ICD-10-CM

## 2021-09-06 MED ORDER — TRIAMCINOLONE ACETONIDE 40 MG/ML IJ SUSP
20.0000 mg | Freq: Once | INTRAMUSCULAR | Status: AC
  Administered 2021-09-06: 20 mg

## 2021-09-06 NOTE — Progress Notes (Signed)
She presents today with chief complaint of pain to the third fourth and fifth digits of the right foot.  States that she still has occasional pain to her heel.  States that the pain in the forefoot appears to be at like a nerve pain.  Objective: Vital signs are stable alert oriented x3 pulses are palpable.  She has tenderness on palpation of the third interdigital space with a palpable neuroma..  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  Plan: I injected 10 mg of Kenalog 5 mg Marcaine to the point of maximal tenderness today tolerated procedure well without complications follow-up with her in 1 month if necessary.

## 2021-09-07 ENCOUNTER — Ambulatory Visit (INDEPENDENT_AMBULATORY_CARE_PROVIDER_SITE_OTHER): Payer: BC Managed Care – PPO | Admitting: Neurology

## 2021-09-07 DIAGNOSIS — Z789 Other specified health status: Secondary | ICD-10-CM

## 2021-09-07 DIAGNOSIS — E669 Obesity, unspecified: Secondary | ICD-10-CM

## 2021-09-07 DIAGNOSIS — G478 Other sleep disorders: Secondary | ICD-10-CM

## 2021-09-07 DIAGNOSIS — G4733 Obstructive sleep apnea (adult) (pediatric): Secondary | ICD-10-CM | POA: Diagnosis not present

## 2021-09-07 DIAGNOSIS — R519 Headache, unspecified: Secondary | ICD-10-CM

## 2021-09-07 DIAGNOSIS — R351 Nocturia: Secondary | ICD-10-CM

## 2021-09-07 DIAGNOSIS — R635 Abnormal weight gain: Secondary | ICD-10-CM

## 2021-09-07 DIAGNOSIS — G4700 Insomnia, unspecified: Secondary | ICD-10-CM

## 2021-09-07 DIAGNOSIS — R0683 Snoring: Secondary | ICD-10-CM

## 2021-09-07 DIAGNOSIS — G4720 Circadian rhythm sleep disorder, unspecified type: Secondary | ICD-10-CM

## 2021-09-15 ENCOUNTER — Telehealth: Payer: Self-pay | Admitting: Neurology

## 2021-09-15 NOTE — Telephone Encounter (Signed)
Called patient to discuss sleep study results. No answer at this time. LVM for the patient to call back.   

## 2021-09-15 NOTE — Procedures (Signed)
PATIENT'S NAME:  Natalie Le, Natalie Le DOB:      Dec 04, 1961      MR#:    [id number]     DATE OF RECORDING: 09/07/2021 REFERRING M.D.:  Leanna Battles, MD Study Performed:   Baseline Polysomnogram HISTORY: 60 year old woman with a history of reflux disease, ADD, irritable bowel syndrome, migraine headaches, interstitial cystitis, chest pain, depression, low back pain, hearing loss, history of upper GI bleed, kidney stone, and borderline obesity, who reports difficulty using her CPAP machine. The patient endorsed the Epworth Sleepiness Scale at 4 points. The patient's weight 196 pounds with a height of 67 (inches), resulting in a BMI of 30.8 kg/m2. The patient's neck circumference measured 15.5 inches.  CURRENT MEDICATIONS: Xanax, Adderall, Bentyl, Prosom, Prozac, Flonase, Mobic, Protonix, Pravachol, Desyrel, Tylenol   PROCEDURE:  This is a multichannel digital polysomnogram utilizing the Somnostar 11.2 system.  Electrodes and sensors were applied and monitored per AASM Specifications.   EEG, EOG, Chin and Limb EMG, were sampled at 200 Hz.  ECG, Snore and Nasal Pressure, Thermal Airflow, Respiratory Effort, CPAP Flow and Pressure, Oximetry was sampled at 50 Hz. Digital video and audio were recorded.      BASELINE STUDY  Lights Out was at 21:37 and Lights On at 05:15.  Total recording time (TRT) was 459 minutes, with a total sleep time (TST) of 253.5 minutes.   The patient's sleep latency was 172.5 minutes, which is delayed. REM sleep was absent. The sleep efficiency was 55.2 %, which is reduced.     SLEEP ARCHITECTURE: WASO (Wake after sleep onset) was 139.5 minutes with severe sleep fragmentation noted.  There were 89.5 minutes in Stage N1, 143 minutes Stage N2, 21 minutes Stage N3 and 0 minutes in Stage REM.  The percentage of Stage N1 was 35.3%, which is markedly increased, Stage N2 was 56.4%, which is mildly increased, Stage N3 was 8.3% and Stage R (REM sleep) was absent. The arousals were noted as: 152  were spontaneous, 0 were associated with PLMs, 1 were associated with respiratory events.  RESPIRATORY ANALYSIS:  There were a total of 1 respiratory events:  0 obstructive apneas, 0 central apneas and 0 mixed apneas with a total of 0 apneas and an apnea index (AI) of 0 /hour. There were 1 hypopneas with a hypopnea index of .2 /hour. The patient also had 0 respiratory event related arousals (RERAs).      The total APNEA/HYPOPNEA INDEX (AHI) was .2/hour and the total RESPIRATORY DISTURBANCE INDEX was  .2 /hour.  0 events occurred in REM sleep and 2 events in NREM. The REM AHI was n/a, versus a non-REM AHI of .2. The patient spent 153.5 minutes of total sleep time in the supine position and 100 minutes in non-supine.. The supine AHI was 0.0 versus a non-supine AHI of 0.6.  OXYGEN SATURATION & C02:  The Wake baseline 02 saturation was 96%, with the lowest being 91%. Time spent below 89% saturation equaled 0 minutes. PERIODIC LIMB MOVEMENTS: The patient had a total of 0 Periodic Limb Movements.  The Periodic Limb Movement (PLM) index was 0 and the PLM Arousal index was 0/hour.  Audio and video analysis did not show any abnormal or unusual movements, behaviors, phonations or vocalizations.  The patient took 1 bathroom break. Mild to moderate snoring was noted. The EKG was in keeping with normal sinus rhythm (NSR).  Post-study, the patient indicated that sleep was the same as or worse than usual.   IMPRESSION:  Primary Snoring Dysfunctions associated  with sleep stages or arousal from sleep Poor sleep pattern  RECOMMENDATIONS:  This study does not demonstrate any significant obstructive or central sleep disordered breathing. The study was limited due to decrease in sleep efficiency with poor sleep consolidation and absence of REM sleep. Overall AHI was less than 5/hour, O2 nadir 91%. Based on current results, treatment with positive airway pressure is not warranted, but, again, her sleep disordered  breathing may very well be underestimated. The patient was noted to snore. For disturbing snoring, an oral appliance (through a qualified dentist) can be considered.  This study shows significant sleep fragmentation and abnormal sleep stage percentages; these are nonspecific findings and per se do not signify an intrinsic sleep disorder or a cause for the patient's sleep-related symptoms. Causes include (but are not limited to) the first night effect of the sleep study, circadian rhythm disturbances, medication effect or an underlying mood disorder or medical problem.  The patient should be cautioned not to drive, work at heights, or operate dangerous or heavy equipment when tired or sleepy. Review and reiteration of good sleep hygiene measures should be pursued with any patient. The patient will be advised to follow up with the referring provider, who will also be notified of the test results.  I certify that I have reviewed the entire raw data recording prior to the issuance of this report in accordance with the Standards of Accreditation of the American Academy of Sleep Medicine (AASM)  Star Age, MD, PhD Diplomat, American Board of Neurology and Sleep Medicine (Neurology and Sleep Medicine)

## 2021-09-15 NOTE — Telephone Encounter (Signed)
Pt returned call. I was able to review the sleep study in detail with her.  Informed her there was no concern of sleep apnea but it was noted that she had limited amount of sleep. Advised that for treatment  of snoring we could refer to dentist but they typically don't cover for snoring alone. Pt verbalized understanding. Pt states that her sleep has been really restless and that she is not getting more than 4 hrs a night of sleep. She asked what could be done. Advised that we were able to at least rule out a organic sleep disorder reason such as sleep apnea. Advised at this point following up with primary care to discuss treatment to help with sleeping is best course. Sometimes a referral for cognitive behavior therapy is ordered and they can help with sleep promoting behaviors. Pt verbalized understanding. She will touch base with PCP and contact us if she need anything

## 2021-09-15 NOTE — Telephone Encounter (Signed)
-----   Message from Star Age, MD sent at 09/15/2021  2:27 PM EDT ----- Patient referred by Dr. Philip Aspen for re-eval of her OSA, she has had difficulty tolerating her CPAP from years ago. I saw her on 07/26/21 and she had a PSG on 09/07/21.  Please call and notify the patient that the recent sleep study did not show any significant obstructive sleep apnea. Unfortunately, she did not sleep well and the study was limited due to decrease in sleep efficiency with poor sleep consolidation and absence of REM/dream sleep. Overall AHI was less than 5/hour, O2 nadir 91%. Based on current results, treatment with positive airway pressure (such as a CPAP or autoPAP) is not warranted, but, again, her sleep disordered breathing, ie sleep apnea, may very well be underestimated. The patient was noted to snore. For disturbing snoring, an oral appliance (through a qualified dentist) can be considered. If she would like to get a referral to dentistry, we can certainly facilitate that.  At this juncture, I recommend she FU with her primary care. She may not need to use her CPAP any longer and indicated, she has not been doing well with it.   Thanks, Star Age, MD, PhD [facility])

## 2021-09-20 DIAGNOSIS — G4700 Insomnia, unspecified: Secondary | ICD-10-CM | POA: Diagnosis not present

## 2021-09-30 DIAGNOSIS — D101 Benign neoplasm of tongue: Secondary | ICD-10-CM | POA: Diagnosis not present

## 2021-10-11 ENCOUNTER — Encounter: Payer: Self-pay | Admitting: Podiatry

## 2021-10-11 ENCOUNTER — Other Ambulatory Visit: Payer: Self-pay

## 2021-10-11 ENCOUNTER — Ambulatory Visit (INDEPENDENT_AMBULATORY_CARE_PROVIDER_SITE_OTHER): Payer: BC Managed Care – PPO | Admitting: Podiatry

## 2021-10-11 DIAGNOSIS — Z0000 Encounter for general adult medical examination without abnormal findings: Secondary | ICD-10-CM | POA: Insufficient documentation

## 2021-10-11 DIAGNOSIS — K2281 Esophageal polyp: Secondary | ICD-10-CM | POA: Insufficient documentation

## 2021-10-11 DIAGNOSIS — G5781 Other specified mononeuropathies of right lower limb: Secondary | ICD-10-CM | POA: Diagnosis not present

## 2021-10-11 DIAGNOSIS — G5780 Other specified mononeuropathies of unspecified lower limb: Secondary | ICD-10-CM

## 2021-10-11 DIAGNOSIS — R42 Dizziness and giddiness: Secondary | ICD-10-CM | POA: Insufficient documentation

## 2021-10-11 DIAGNOSIS — J302 Other seasonal allergic rhinitis: Secondary | ICD-10-CM | POA: Insufficient documentation

## 2021-10-11 DIAGNOSIS — H903 Sensorineural hearing loss, bilateral: Secondary | ICD-10-CM | POA: Insufficient documentation

## 2021-10-12 DIAGNOSIS — G5781 Other specified mononeuropathies of right lower limb: Secondary | ICD-10-CM | POA: Diagnosis not present

## 2021-10-12 MED ORDER — DEXAMETHASONE SODIUM PHOSPHATE 120 MG/30ML IJ SOLN
2.0000 mg | Freq: Once | INTRAMUSCULAR | Status: AC
  Administered 2021-10-12: 2 mg via INTRA_ARTICULAR

## 2021-10-12 NOTE — Progress Notes (Signed)
She presents today for follow-up of her neuroma third interdigital space right foot.  States that is doing fine I really have not had any trouble.  Objective: Vital signs are stable oriented x3 has pain on palpation third interspace of the right foot.  Assessment: Neuroma resolving third interdigital space.  Plan: Injected dexamethasone 2 mg to the point of maximal tenderness.

## 2021-10-12 NOTE — Addendum Note (Signed)
Addended by: Rip Harbour on: 10/12/2021 08:14 AM   Modules accepted: Orders

## 2021-11-03 DIAGNOSIS — F900 Attention-deficit hyperactivity disorder, predominantly inattentive type: Secondary | ICD-10-CM | POA: Diagnosis not present

## 2021-11-03 DIAGNOSIS — F410 Panic disorder [episodic paroxysmal anxiety] without agoraphobia: Secondary | ICD-10-CM | POA: Diagnosis not present

## 2021-11-03 DIAGNOSIS — F3181 Bipolar II disorder: Secondary | ICD-10-CM | POA: Diagnosis not present

## 2021-11-15 ENCOUNTER — Ambulatory Visit: Payer: BC Managed Care – PPO | Admitting: Podiatry

## 2021-12-06 DIAGNOSIS — Z1231 Encounter for screening mammogram for malignant neoplasm of breast: Secondary | ICD-10-CM | POA: Diagnosis not present

## 2021-12-06 DIAGNOSIS — Z6831 Body mass index (BMI) 31.0-31.9, adult: Secondary | ICD-10-CM | POA: Diagnosis not present

## 2021-12-06 DIAGNOSIS — Z780 Asymptomatic menopausal state: Secondary | ICD-10-CM | POA: Diagnosis not present

## 2021-12-06 DIAGNOSIS — Z130 Encounter for screening for diseases of the blood and blood-forming organs and certain disorders involving the immune mechanism: Secondary | ICD-10-CM | POA: Diagnosis not present

## 2021-12-06 DIAGNOSIS — Z01419 Encounter for gynecological examination (general) (routine) without abnormal findings: Secondary | ICD-10-CM | POA: Diagnosis not present

## 2021-12-14 DIAGNOSIS — R051 Acute cough: Secondary | ICD-10-CM | POA: Diagnosis not present

## 2021-12-14 DIAGNOSIS — J069 Acute upper respiratory infection, unspecified: Secondary | ICD-10-CM | POA: Diagnosis not present

## 2021-12-14 DIAGNOSIS — R0981 Nasal congestion: Secondary | ICD-10-CM | POA: Diagnosis not present

## 2022-01-10 ENCOUNTER — Other Ambulatory Visit: Payer: Self-pay

## 2022-01-10 ENCOUNTER — Ambulatory Visit (INDEPENDENT_AMBULATORY_CARE_PROVIDER_SITE_OTHER): Payer: BC Managed Care – PPO | Admitting: Neurology

## 2022-01-10 ENCOUNTER — Encounter: Payer: Self-pay | Admitting: Neurology

## 2022-01-10 ENCOUNTER — Other Ambulatory Visit: Payer: Self-pay | Admitting: Neurology

## 2022-01-10 VITALS — BP 125/79 | HR 77 | Ht 67.0 in | Wt 205.0 lb

## 2022-01-10 DIAGNOSIS — H9191 Unspecified hearing loss, right ear: Secondary | ICD-10-CM

## 2022-01-10 DIAGNOSIS — H538 Other visual disturbances: Secondary | ICD-10-CM | POA: Diagnosis not present

## 2022-01-10 DIAGNOSIS — G43709 Chronic migraine without aura, not intractable, without status migrainosus: Secondary | ICD-10-CM | POA: Diagnosis not present

## 2022-01-10 DIAGNOSIS — R519 Headache, unspecified: Secondary | ICD-10-CM | POA: Diagnosis not present

## 2022-01-10 DIAGNOSIS — R51 Headache with orthostatic component, not elsewhere classified: Secondary | ICD-10-CM

## 2022-01-10 DIAGNOSIS — H539 Unspecified visual disturbance: Secondary | ICD-10-CM

## 2022-01-10 DIAGNOSIS — R42 Dizziness and giddiness: Secondary | ICD-10-CM

## 2022-01-10 MED ORDER — NURTEC 75 MG PO TBDP: 75.0000 mg | ORAL_TABLET | Freq: Every day | ORAL | 0 refills | Status: DC | PRN

## 2022-01-10 MED ORDER — ONDANSETRON 4 MG PO TBDP: 4.0000 mg | ORAL_TABLET | Freq: Three times a day (TID) | ORAL | 3 refills | Status: AC | PRN

## 2022-01-10 MED ORDER — EMGALITY 120 MG/ML ~~LOC~~ SOAJ: 120.0000 mg | SUBCUTANEOUS | 11 refills | Status: DC

## 2022-01-10 MED ORDER — RIZATRIPTAN BENZOATE 10 MG PO TBDP: 10.0000 mg | ORAL_TABLET | ORAL | 11 refills | Status: DC | PRN

## 2022-01-10 NOTE — Progress Notes (Addendum)
[facility]    Provider:  Dr Jaynee Eagles Requesting Provider: Noemi Chapel, NP Primary Care Provider:  Donnajean Lopes, MD  CC:  migraines  HPI:  Natalie Le is a 61 y.o. female here as requested by Noemi Chapel, NP for migraines. She has been getting headaches since college. It has been so bad she has been to the ED. At least 8 migraines a month, at least 15 headache days a month. She was recenty re-tested and does not have sleep apnea. Weather pressure changes cause a headache or migraine. She tries to drink enough water, she has examined her lifestyle, they are pulsating/poundig/throbbing, nausea, light and sound sensitivity, closng the lights and a dark room helps, cold helps, nausea and also vomits. 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 She tried topiramate and she had side effects and that is what they think caused her right ear hearing loss, she has extensively evaluated and seen Dr. Roland Earl and had an MRI of the brain. She last had an MRI and in 2017 there was an unusual lesion. She has morning and positional headaches. Hearing loss right ear. Headache can be worse positionally in the middle of the night has woken with severe headache, also dizziness and blurry vision. Headaches are worsening. No other focal neurologic deficits, associated symptoms, inciting events or modifiable factors.  Reviewed notes, labs and imaging from outside physicians, which showed:   05/2019: BUN 22, creat 0.91  Medications tried: Topiramate(cognitive problems), excedrin, propranolol contraindicated due to hypotension (she usually run about 154 systolic), verapamil(with hypotension), aspirin, celexa, amitriptyline/nortriptyline(se effects of sedation and cognitive problems), celexa, flexeril, prozac, mobic, medrol dosepak, metoprolol(hypotension), zofran, trazodone. Sumatriptan, Rizatriptan, candesartan(ontraindicated due to hypotension (she usually run about 008  systolic and she has had symptomatic hypotension with every other blood pressure medication she has tried even low dose)  IMPRESSION: personally reviewed images and with patient 1. Single prominent subcortical T2 hyperintensity in the right frontal operculum. The finding is nonspecific but can be seen in the setting of chronic microvascular ischemia, a demyelinating process such as multiple sclerosis, vasculitis, complicated migraine headaches, or as the sequelae of a prior infectious or inflammatory process. 2. Otherwise normal MRI of the brain for age.  Review of Systems: Patient complains of symptoms per HPI as well as the following symptoms headaches. Pertinent negatives and positives per HPI. All others negative.   Social History   Socioeconomic History   Marital status: Single    Spouse name: Not on file   Number of children: 0   Years of education: college   Highest education level: Not on file  Occupational History    Employer: [employer]    Comment: Sales  Tobacco Use   Smoking status: Never   Smokeless tobacco: Never  Vaping Use   Vaping Use: Never used  Substance and Sexual Activity   Alcohol use: Yes    Alcohol/week: 1.0 standard drink    Types: 1 Glasses of wine per week    Comment: rarely   Drug use: No   Sexual activity: Yes    Birth control/protection: Surgical  Other Topics Concern   Not on file  Social History Narrative   Patient lives at home alone.   Patient works full time Press photographer.   Education college degree   Right handed   Caffeine one cup of coffee daily sometimes tea.   Social Determinants of Health   Financial Resource Strain: Not on file  Food Insecurity: Not  on file  Transportation Needs: Not on file  Physical Activity: Not on file  Stress: Not on file  Social Connections: Not on file  Intimate Partner Violence: Not on file    Family History  Problem Relation Age of Onset   Alzheimer's disease Mother    Hypertension Mother     Diabetes Mother    Hypertension Father    Heart Problems Father        quadruple bypass   Hyperlipidemia Father    Migraines Sister    Diabetes Sister    Hypertension Sister    Hyperlipidemia Sister    Hyperlipidemia Brother    Hypertension Brother    Migraines Maternal Grandmother    Anuerysm Maternal Grandmother    Colon cancer Maternal Grandfather    Hypertension Maternal Grandfather    Hyperlipidemia Maternal Grandfather    Diabetes Maternal Grandfather    Diabetes Paternal Grandmother    Hypertension Paternal Grandmother    Hyperlipidemia Paternal Grandmother    Lung cancer Paternal Grandfather     Past Medical History:  Diagnosis Date   ADD (attention deficit disorder)    Allergy    takes Allegra D daily prn allergies and Flonase prn allergies   Anxiety    takes Xanax prn anxiety   Arthritis    Bipolar 2 disorder (HCC)    takes Adderall daily   Chronic deafness in right ear    Chronic low back pain    Depression    takes Prozac daily   Dizziness    Hearing impaired    deaf in right ear   Hiatal hernia    History of fracture of nasal bone 07/01/2007   History of migraine    last one on 10/09/13   Hyperlipidemia    IBS (irritable bowel syndrome)    Insomnia    takes Trazodone nightly prn sleep   Laryngitis    LVH (left ventricular hypertrophy) 04/05/2015   Mild, Noted on ECHO   Muscle spasms of neck    takes Flexeril daily prn muscle spasms   OSA on CPAP 05/14/2014   resolved after weight loss   Pleurisy    when in college   Pneumonia    walking pneumonia in college   PONV (postoperative nausea and vomiting)    Right ureteral stone    4 x 3 mm stone at the right ureterovesical junction    Shortness of breath    with exertion/sitting   TIA (transient ischemic attack) 03/2015   Toe fracture, right 04/2017   great toe   Urinary frequency    Viral infection    09/25/13-was given an inhaler d/t SOB;was on ZPak   Weakness    both hands     Patient Active Problem List   Diagnosis Date Noted   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   Dizzy spells 10/11/2021   Encounter for general adult medical examination without abnormal findings 10/11/2021   Esophageal polyp 10/11/2021   Other seasonal allergic rhinitis 10/11/2021   Fibroma of tongue 09/30/2021   Impacted cerumen of left ear 06/09/2021   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) 06/09/2021   Dysphagia 02/08/2021   Hoarseness 02/08/2021   Laryngopharyngeal reflux 02/08/2021   Throat discomfort 02/08/2021   Acute pharyngitis 06/24/2020   Streptococcal pharyngitis 06/24/2020   Chronic kidney disease, stage 3a ([place]) 03/08/2020   Migraine 01/28/2020   Chest pain of uncertain etiology 82/95/6213   Hyperglycemia 03/05/2017   Disorientation 04/19/2016  Facial weakness 04/19/2016   Fatigue 04/19/2016   Tinnitus 04/19/2016   Cough 12/02/2015   DDD (degenerative disc disease), cervical 07/02/2015   Anxiety disorder 04/19/2015   Palpitations 04/19/2015   Bipolar disorder ([place]) 04/05/2015   TIA (transient ischemic attack) 04/04/2015   Aphasia 04/04/2015   OSA on CPAP 05/14/2014   Neck pain 06/26/2013   Hyperlipidemia 12/25/2012   Amnesia 04/11/2012   Hearing loss 06/12/2011   Skin sensation disturbance 05/12/2010   Attention deficit hyperactivity disorder, 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   Chronic interstitial cystitis 04/18/2010   Diaphragmatic hernia 04/18/2010   Insomnia 04/18/2010   Irritable bowel syndrome 04/18/2010   Major depression, single episode 04/18/2010    Past Surgical History:  Procedure Laterality Date   ABDOMINAL HERNIA REPAIR     ABDOMINAL HYSTERECTOMY     bladder  stretch     BUNIONECTOMY     COCHLEAR IMPLANT Right    COLONOSCOPY     cyst removed from left arm      as a child   ELBOW FRACTURE SURGERY Right    fx nose repair     LEFT HEART CATH AND CORONARY ANGIOGRAPHY N/A 06/24/2019    Procedure: LEFT HEART CATH AND CORONARY ANGIOGRAPHY;  Surgeon: Adrian Prows, MD;  Location: [facility];  Service: Cardiovascular;  Laterality: N/A;   NECK SURGERY  12/2015   tumor removed from left breast  48yrs ago   UPPER GASTROINTESTINAL ENDOSCOPY      Current Outpatient Medications  Medication Sig Dispense Refill   ALPRAZolam (XANAX) 0.5 MG tablet Take 0.5 mg by mouth 2 (two) times daily as needed for anxiety or sleep (panic attacks).   1   amphetamine-dextroamphetamine (ADDERALL) 20 MG tablet Take 20 mg by mouth 3 (three) times daily as needed (adhd).      aspirin-acetaminophen-caffeine (EXCEDRIN MIGRAINE) 250-250-65 MG tablet Take 1-2 tablets by mouth every 6 (six) hours as needed for headache.     dicyclomine (BENTYL) 10 MG capsule Take 20 mg by mouth 3 (three) times daily as needed for spasms.   1   fluticasone (FLONASE) 50 MCG/ACT nasal spray Place 1-2 sprays into both nostrils daily as needed for allergies.      Galcanezumab-gnlm (EMGALITY) 120 MG/ML SOAJ Inject 120 mg into the skin every 30 (thirty) days. 1.12 mL 11   ondansetron (ZOFRAN-ODT) 4 MG disintegrating tablet Take 1-2 tablets (4-8 mg total) by mouth every 8 (eight) hours as needed. 30 tablet 3   pravastatin (PRAVACHOL) 40 MG tablet Take 40 mg by mouth daily.     Rimegepant Sulfate (NURTEC) 75 MG TBDP Take 75 mg by mouth daily as needed. For migraines. Take as close to onset of migraine as possible. One daily maximum. 16 tablet 0   rizatriptan (MAXALT-MLT) 10 MG disintegrating tablet Take 1 tablet (10 mg total) by mouth as needed for migraine. May repeat in 2 hours if needed 9 tablet 11   zolpidem (AMBIEN) 10 MG tablet Take 5-10 mg by mouth at bedtime as needed.     citalopram (CELEXA) 20 MG tablet citalopram 20 mg tablet     No current facility-administered medications for this visit.    Allergies as of 01/10/2022 - Review Complete 01/10/2022  Allergen Reaction Noted   Codeine  07/25/2021   Demerol Nausea And  Vomiting 03/29/2011   Rosuvastatin  07/25/2021   Tetanus-diphtheria toxoids td  09/21/2021   Topamax [topiramate] Other (See Comments) 07/01/2012   Lamictal [lamotrigine] Rash 03/29/2011  Vitals: BP 125/79    Pulse 77    Ht 5\' 7"  (1.702 m)    Wt 205 lb (93 kg)    LMP 12/18/2005    BMI 32.11 kg/m  Last Weight:  Wt Readings from Last 1 Encounters:  01/10/22 205 lb (93 kg)   Last Height:   Ht Readings from Last 1 Encounters:  01/10/22 5\' 7"  (1.702 m)     Physical exam: Exam: Gen: NAD, conversant, well nourised, obese, well groomed                     CV: RRR, no MRG. No Carotid Bruits. No peripheral edema, warm, nontender Eyes: Conjunctivae clear without exudates or hemorrhage  Neuro: Detailed Neurologic Exam  Speech:    Speech is normal; fluent and spontaneous with normal comprehension.  Cognition:    The patient is oriented to person, place, and time;     recent and remote memory intact;     language fluent;     normal attention, concentration,     fund of knowledge Cranial Nerves:    The pupils are equal, round, and reactive to light. Pupils too small to visualize fundi. Visual fields are full to finger confrontation. Extraocular movements are intact. Trigeminal sensation is intact and the muscles of mastication are normal. The face is symmetric. The palate elevates in the midline. Hearing loss right ear. Voice is normal. Shoulder shrug is normal. The tongue has normal motion without fasciculations.   Coordination:    Normal .   Gait:     normal.   Motor Observation:    No asymmetry, no atrophy, and no involuntary movements noted. Tone:    Normal muscle tone.    Posture:    Posture is normal. normal erect    Strength:    Strength is V/V in the upper and lower limbs.      Sensation: intact to LT     Reflex Exam:  DTR's:    Deep tendon reflexes in the upper and lower extremities are normal bilaterally.   Toes:    The toes are downgoing bilaterally.    Clonus:    Clonus is absent.    Assessment/Plan:  Absolutely lovely patient with chronic migraines. But given some concerning symptoms nad demyelinatng plaque seen on last MRI in 2017 she needs repeat MRI  MS PROTOCOL PLEASE DR Felecia Shelling TO READ MRI brain MS protocol due to concerning symptoms of morning headaches,  nocturnal headaches, dizziness, vision changes, worsening headaches, prior demyelinating lesion  to look for space occupying mass, chiari or intracranial hypertension (pseudotumor), multiple sclerosis, schwannoma, stroke or other.  She sees dr Sharlett Iles yearly for blood work, I don't have those but ensure he checked a TSH sees him again in a few months  Start Emgality for prevention one injection a month - stays in the [place] as needed for acute/emergent: Take once daily AS NEEDED for migraine Start Rizatriptan: Please take one tablet at the onset of your headache. If it does not improve the symptoms please take one additional tablet. Do not take more then 2 tablets in 24hrs. Do not take use more then 2 to 3 times in a week. Nausea/dizziness: Ondansetron MRI of the Brain w/wo contrast Blood work with dr Sharlett Iles ensure checked thyroid  Mulat  Orders Placed This Encounter  Procedures   MR BRAIN W WO CONTRAST   Meds ordered this encounter  Medications   rizatriptan (MAXALT-MLT) 10  MG disintegrating tablet    Sig: Take 1 tablet (10 mg total) by mouth as needed for migraine. May repeat in 2 hours if needed    Dispense:  9 tablet    Refill:  11   ondansetron (ZOFRAN-ODT) 4 MG disintegrating tablet    Sig: Take 1-2 tablets (4-8 mg total) by mouth every 8 (eight) hours as needed.    Dispense:  30 tablet    Refill:  3   Rimegepant Sulfate (NURTEC) 75 MG TBDP    Sig: Take 75 mg by mouth daily as needed. For migraines. Take as close to onset of migraine as possible. One daily maximum.    Dispense:  16 tablet    Refill:  0    [id number] 04-2024    Galcanezumab-gnlm (EMGALITY) 120 MG/ML SOAJ    Sig: Inject 120 mg into the skin every 30 (thirty) days.    Dispense:  1.12 mL    Refill:  11    Cc: Noemi Chapel, NP,  Donnajean Lopes, MD  Sarina Ill, MD  [facility] [address] [place], [place] [address]  Phone [phone] Fax [phone]  I spent 72 minutes of face-to-face and non-face-to-face time with patient on the  1. Chronic migraine without aura without status migrainosus, not intractable   2. Positional headache   3. Worsening headaches   4. Blurred vision   5. Vision changes   6. Nocturnal headaches   7. Dizziness   8. Complete hearing loss, right   9. Morning headache    diagnosis.  This included previsit chart review, lab review, study review, order entry, electronic health record documentation, patient education on the different diagnostic and therapeutic options, counseling and coordination of care, risks and benefits of management, compliance, or risk factor reduction

## 2022-01-10 NOTE — Patient Instructions (Addendum)
Start Emgality for prevention one injection a month - stays in the [facility] as needed for acute/emergent: Take once daily AS NEEDED for migraine Start Rizatriptan: Please take one tablet at the onset of your headache. If it does not improve the symptoms please take one additional tablet. Do not take more then 2 tablets in 24hrs. Do not take use more then 2 to 3 times in a week. Nausea/dizziness: Ondansetron MRI of the Brain w/wo contrast Blood work with dr Sharlett Iles ensure checked thyroid  [place] injection What is this medication? GALCANEZUMAB (gal ka NEZ ue mab) is used to prevent migraines and treat cluster headaches. This medicine may be used for other purposes; ask your health care provider or pharmacist if you have questions. COMMON BRAND NAME(S): Emgality What should I tell my care team before I take this medication? They need to know if you have any of these conditions: an unusual or allergic reaction to galcanezumab, other medicines, foods, dyes, or preservatives pregnant or trying to get pregnant breast-feeding How should I use this medication? This medicine is for injection under the skin. You will be taught how to prepare and give this medicine. Use exactly as directed. Take your medicine at regular intervals. Do not take your medicine more often than directed. It is important that you put your used needles and syringes in a special sharps container. Do not put them in a trash can. If you do not have a sharps container, call your pharmacist or healthcare provider to get one. Talk to your pediatrician regarding the use of this medicine in children. Special care may be needed. Overdosage: If you think you have taken too much of this medicine contact a poison control center or emergency room at once. NOTE: This medicine is only for you. Do not share this medicine with others. What if I miss a dose? If you miss a dose, take it as soon  as you can. If it is almost time for your next dose, take only that dose. Do not take double or extra doses. What may interact with this medication? Interactions are not expected. This list may not describe all possible interactions. Give your health care provider a list of all the medicines, herbs, non-prescription drugs, or dietary supplements you use. Also tell them if you smoke, drink alcohol, or use illegal drugs. Some items may interact with your medicine. What should I watch for while using this medication? Tell your doctor or healthcare professional if your symptoms do not start to get better or if they get worse. What side effects may I notice from receiving this medication? Side effects that you should report to your doctor or health care professional as soon as possible: allergic reactions like skin rash, itching or hives, swelling of the face, lips, or tongue Side effects that usually do not require medical attention (report these to your doctor or health care professional if they continue or are bothersome): pain, redness, or irritation at site where injected This list may not describe all possible side effects. Call your doctor for medical advice about side effects. You may report side effects to FDA at 1-800-FDA-1088. Where should I keep my medication? Keep out of the reach of children. You will be instructed on how to store this medicine. Throw away any unused medicine after the expiration date on the label. NOTE: This sheet is a summary. It may not cover all possible information. If you have questions about this medicine,  talk to your doctor, pharmacist, or health care provider.  2022 Elsevier/Gold Standard (2018-05-24 00:00:00)  Rimegepant oral dissolving tablet What is this medication? RIMEGEPANT (ri ME je pant) is used to treat migraine headaches with or without aura. An aura is a strange feeling or visual disturbance that warns you of an attack. It is also used to prevent  migraine headaches. This medicine may be used for other purposes; ask your health care provider or pharmacist if you have questions. COMMON BRAND NAME(S): NURTEC ODT What should I tell my care team before I take this medication? They need to know if you have any of these conditions: kidney disease liver disease an unusual or allergic reaction to rimegepant, other medicines, foods, dyes, or preservatives pregnant or trying to get pregnant breast-feeding How should I use this medication? Take the medicine by mouth. Follow the directions on the prescription label. Leave the tablet in the sealed blister pack until you are ready to take it. With dry hands, open the blister and gently remove the tablet. If the tablet breaks or crumbles, throw it away and take a new tablet out of the blister pack. Place the tablet in the mouth and allow it to dissolve, and then swallow. Do not cut, crush, or chew this medicine. You do not need water to take this medicine. Talk to your pediatrician about the use of this medicine in children. Special care may be needed. Overdosage: If you think you have taken too much of this medicine contact a poison control center or emergency room at once. NOTE: This medicine is only for you. Do not share this medicine with others. What if I miss a dose? This does not apply. This medicine is not for regular use. What may interact with this medication? This medicine may interact with the following medications: certain medicines for fungal infections like fluconazole, itraconazole rifampin This list may not describe all possible interactions. Give your health care provider a list of all the medicines, herbs, non-prescription drugs, or dietary supplements you use. Also tell them if you smoke, drink alcohol, or use illegal drugs. Some items may interact with your medicine. What should I watch for while using this medication? Visit your health care professional for regular checks on your  progress. Tell your health care professional if your symptoms do not start to get better or if they get worse. What side effects may I notice from receiving this medication? Side effects that you should report to your doctor or health care professional as soon as possible: allergic reactions like skin rash, itching or hives; swelling of the face, lips, or tongue Side effects that usually do not require medical attention (report these to your doctor or health care professional if they continue or are bothersome): nausea This list may not describe all possible side effects. Call your doctor for medical advice about side effects. You may report side effects to FDA at 1-800-FDA-1088. Where should I keep my medication? Keep out of the reach of children and pets. Store at room temperature between 20 and 25 degrees C (68 and 77 degrees F). Get rid of any unused medicine after the expiration date. To get rid of medicines that are no longer needed or have expired: Take the medicine to a medicine take-back program. Check with your pharmacy or law enforcement to find a location. If you cannot return the medicine, check the label or package insert to see if the medicine should be thrown out in the garbage or flushed down  the toilet. If you are not sure, ask your health care provider. If it is safe to put it in the trash, take the medicine out of the container. Mix the medicine with cat litter, dirt, coffee grounds, or other unwanted substance. Seal the mixture in a bag or container. Put it in the trash. NOTE: This sheet is a summary. It may not cover all possible information. If you have questions about this medicine, talk to your doctor, pharmacist, or health care provider.  2022 Elsevier/Gold Standard (2020-05-18 00:00:00)

## 2022-01-12 ENCOUNTER — Telehealth: Payer: Self-pay | Admitting: Neurology

## 2022-01-12 NOTE — Telephone Encounter (Signed)
Patient returned my call she is scheduled at [facility] for 01/17/22.

## 2022-01-12 NOTE — Telephone Encounter (Signed)
LVM for pt to call back to schedule  [employer] auth: [id number] (exp. 01/12/22 to 02/11/22)

## 2022-01-17 ENCOUNTER — Ambulatory Visit: Payer: BC Managed Care – PPO

## 2022-01-17 DIAGNOSIS — R51 Headache with orthostatic component, not elsewhere classified: Secondary | ICD-10-CM | POA: Diagnosis not present

## 2022-01-17 DIAGNOSIS — R519 Headache, unspecified: Secondary | ICD-10-CM

## 2022-01-17 DIAGNOSIS — H538 Other visual disturbances: Secondary | ICD-10-CM | POA: Diagnosis not present

## 2022-01-17 DIAGNOSIS — H9191 Unspecified hearing loss, right ear: Secondary | ICD-10-CM

## 2022-01-17 DIAGNOSIS — H539 Unspecified visual disturbance: Secondary | ICD-10-CM

## 2022-01-17 DIAGNOSIS — R42 Dizziness and giddiness: Secondary | ICD-10-CM

## 2022-01-17 MED ORDER — GADOBENATE DIMEGLUMINE 529 MG/ML IV SOLN
20.0000 mL | Freq: Once | INTRAVENOUS | Status: AC | PRN
  Administered 2022-01-17: 20 mL via INTRAVENOUS

## 2022-01-24 ENCOUNTER — Telehealth: Payer: Self-pay | Admitting: *Deleted

## 2022-01-24 NOTE — Telephone Encounter (Signed)
Called pt & LVM (ok per DPR) advising pt of MRI results as noted below by Dr Jaynee Eagles. Included details that the white spot has not changed, overall no changes on MRI from 04/19/16. Advised unlikely MS or anything otherwise abnormal. Left office number for call back if needed. Pt has viewed results in mychart.

## 2022-01-24 NOTE — Telephone Encounter (Signed)
-----   Message from Melvenia Beam, MD sent at 01/19/2022  5:42 PM EST ----- Patients MRI had no changes,  Overall no change from 04/19/2016. That is great news, we saw one 6-25mm right-sided white spot on the MRI in 2017 and that has not changed so very unlikely to be multiple sclerosis or anything otherwise abnormal. thanks

## 2022-02-01 ENCOUNTER — Encounter: Payer: Self-pay | Admitting: Neurology

## 2022-02-02 ENCOUNTER — Encounter: Payer: Self-pay | Admitting: *Deleted

## 2022-02-02 ENCOUNTER — Telehealth: Payer: Self-pay | Admitting: *Deleted

## 2022-02-02 NOTE — Telephone Encounter (Signed)
Completed Emgality PA on Cover My Meds. Key: [id number]. Awaiting determination from [facility].

## 2022-03-02 DIAGNOSIS — F410 Panic disorder [episodic paroxysmal anxiety] without agoraphobia: Secondary | ICD-10-CM | POA: Diagnosis not present

## 2022-03-02 DIAGNOSIS — F900 Attention-deficit hyperactivity disorder, predominantly inattentive type: Secondary | ICD-10-CM | POA: Diagnosis not present

## 2022-03-02 DIAGNOSIS — F3181 Bipolar II disorder: Secondary | ICD-10-CM | POA: Diagnosis not present

## 2022-03-08 NOTE — Telephone Encounter (Addendum)
The denial was based on our criteria for Emgality Inj '120mg'$ /Ml.  ?Per your health plan's criteria, this drug is covered if you meet the following: ?(1) Your doctor provides your medical records (for example: chart notes) showing that you have a type ?of headache condition (chronic migraines). ?(2) Your doctor provides your medical records (for example: chart notes) showing that you have greater ?than or equal to 15 headache days per month, of which at least 8 must be migraine days for at least 3 ?months. ?(3) There are paid claims or your doctor provides medical records (for example: chart notes) showing ?two of the following: ?(A) You have tried amitriptyline or venlafaxine for at least two months or cannot use the drugs. ?(B) You have tried divalproex sodium or topiramate for at least two months or cannot use the drugs. ?(C) You have tried one beta blocker drug (that is, atenolol, propranolol, nadolol, timolol, or ?metoprolol) for at least two months or cannot use the drugs. ?(D) You have tried candesartan for at least two months or cannot use the drug. ?(4) There are paid claims or your doctor provides medical records (for example: chart notes) showing ?that you have tried or cannot use both Aimovig and Ajovy. ?The information provided does not show that you meet the criteria listed above. ?Please speak with your doctor about your choices. ?Reviewed by: [email or web address], R.Ph. ?**Please note: The drug(s) listed above may require additional review. ?Case number: [id number] ? ?Appeals within 180 calendar days, Phone: [phone] ?Fax: [phone] ? ?

## 2022-03-09 MED ORDER — AJOVY 225 MG/1.5ML ~~LOC~~ SOAJ: 225.0000 mg | SUBCUTANEOUS | 3 refills | Status: DC

## 2022-03-09 NOTE — Telephone Encounter (Signed)
Ok patient has been notified, Ajovy ordered, and Emgality canceled.  ?

## 2022-03-21 ENCOUNTER — Ambulatory Visit (INDEPENDENT_AMBULATORY_CARE_PROVIDER_SITE_OTHER): Payer: BC Managed Care – PPO | Admitting: Podiatry

## 2022-03-21 ENCOUNTER — Encounter: Payer: Self-pay | Admitting: Podiatry

## 2022-03-21 DIAGNOSIS — G5780 Other specified mononeuropathies of unspecified lower limb: Secondary | ICD-10-CM

## 2022-03-21 DIAGNOSIS — G5782 Other specified mononeuropathies of left lower limb: Secondary | ICD-10-CM | POA: Diagnosis not present

## 2022-03-21 DIAGNOSIS — M79673 Pain in unspecified foot: Secondary | ICD-10-CM | POA: Diagnosis not present

## 2022-03-21 DIAGNOSIS — G5781 Other specified mononeuropathies of right lower limb: Secondary | ICD-10-CM

## 2022-03-21 MED ORDER — TRIAMCINOLONE ACETONIDE 40 MG/ML IJ SUSP
40.0000 mg | Freq: Once | INTRAMUSCULAR | Status: AC
  Administered 2022-03-21: 40 mg

## 2022-03-21 NOTE — Progress Notes (Signed)
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  I would like for these feet to feel better. ? ?Objective: Vital signs are stable she is alert and oriented x3.  Mild ecchymosis on dorsal aspect of the right foot due to dropping a hair dryer on it.  States that is mildly tender right over the deep peroneal nerve as she points to the area.  Pulses are strongly palpable bilaterally.  She does have pain on palpation to the third interdigital space bilateral.  Palpable Mulder's click is noted. ? ?Assessment: Contusion right foot due to hairdryer within neuroma third interdigital space bilateral. ? ?Plan: Discussed etiology pathology conservative surgical therapies at this point we went ahead and injected the third interdigital space today with 10 mg Kenalog 5 mg of Marcaine.  I like to follow-up with her the third week of May before she leaves for [place]. ?

## 2022-03-22 ENCOUNTER — Other Ambulatory Visit: Payer: Self-pay | Admitting: Podiatry

## 2022-03-22 ENCOUNTER — Telehealth: Payer: Self-pay | Admitting: *Deleted

## 2022-03-22 MED ORDER — MELOXICAM 15 MG PO TABS: 15.0000 mg | ORAL_TABLET | Freq: Every day | ORAL | 3 refills | Status: DC

## 2022-03-22 NOTE — Telephone Encounter (Signed)
Patient forgot to ask during yesterday but wanted to know if she could get a prescription for Meloxicam. Please send to pharmacy on file. ?

## 2022-03-23 NOTE — Telephone Encounter (Signed)
Patient notified

## 2022-03-29 DIAGNOSIS — M542 Cervicalgia: Secondary | ICD-10-CM | POA: Diagnosis not present

## 2022-05-09 ENCOUNTER — Ambulatory Visit (INDEPENDENT_AMBULATORY_CARE_PROVIDER_SITE_OTHER): Payer: BC Managed Care – PPO | Admitting: Podiatry

## 2022-05-09 ENCOUNTER — Encounter: Payer: Self-pay | Admitting: Podiatry

## 2022-05-09 DIAGNOSIS — G5780 Other specified mononeuropathies of unspecified lower limb: Secondary | ICD-10-CM

## 2022-05-09 DIAGNOSIS — N760 Acute vaginitis: Secondary | ICD-10-CM | POA: Insufficient documentation

## 2022-05-09 DIAGNOSIS — G5781 Other specified mononeuropathies of right lower limb: Secondary | ICD-10-CM | POA: Diagnosis not present

## 2022-05-09 DIAGNOSIS — G5763 Lesion of plantar nerve, bilateral lower limbs: Secondary | ICD-10-CM

## 2022-05-09 DIAGNOSIS — G5782 Other specified mononeuropathies of left lower limb: Secondary | ICD-10-CM | POA: Diagnosis not present

## 2022-05-09 DIAGNOSIS — B9689 Other specified bacterial agents as the cause of diseases classified elsewhere: Secondary | ICD-10-CM | POA: Insufficient documentation

## 2022-05-09 DIAGNOSIS — N898 Other specified noninflammatory disorders of vagina: Secondary | ICD-10-CM | POA: Insufficient documentation

## 2022-05-09 DIAGNOSIS — N951 Menopausal and female climacteric states: Secondary | ICD-10-CM | POA: Insufficient documentation

## 2022-05-09 DIAGNOSIS — N952 Postmenopausal atrophic vaginitis: Secondary | ICD-10-CM | POA: Insufficient documentation

## 2022-05-09 MED ORDER — TRIAMCINOLONE ACETONIDE 40 MG/ML IJ SUSP
40.0000 mg | Freq: Once | INTRAMUSCULAR | Status: AC
  Administered 2022-05-09: 40 mg

## 2022-05-09 NOTE — Progress Notes (Signed)
She presents today for follow-up of neuroma third interspace bilaterally she is about to leave for [place] in 2 weeks and she like to make sure this is not bothering her.  Objective: Vital signs are stable alert oriented x3 there is no erythema edema salines drainage noted that she does have palpable Mulder's click third and digital space but has much improved since previous evaluation.  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  Plan: Injection dexamethasone 2 mg bilateral third interdigital space with local anesthetic.  Tolerated procedure well without complications we will follow-up with her when she returns from [place].

## 2022-05-10 ENCOUNTER — Encounter: Payer: Self-pay | Admitting: Neurology

## 2022-05-10 ENCOUNTER — Ambulatory Visit (INDEPENDENT_AMBULATORY_CARE_PROVIDER_SITE_OTHER): Payer: BC Managed Care – PPO | Admitting: Neurology

## 2022-05-10 ENCOUNTER — Telehealth: Payer: Self-pay | Admitting: *Deleted

## 2022-05-10 VITALS — BP 140/90 | HR 86 | Ht 67.0 in | Wt 204.0 lb

## 2022-05-10 DIAGNOSIS — G43009 Migraine without aura, not intractable, without status migrainosus: Secondary | ICD-10-CM

## 2022-05-10 DIAGNOSIS — G43709 Chronic migraine without aura, not intractable, without status migrainosus: Secondary | ICD-10-CM

## 2022-05-10 NOTE — Patient Instructions (Addendum)
[email or web address] Nurtec as needed for migraine/headache - look for a call from specialty pharmacy

## 2022-05-10 NOTE — Telephone Encounter (Signed)
Completed Ajovy PA on Cover My Meds requesting urgent review. KeyHollie Salk - PA Case ID: [id number] - Rx #: [id number]. Awaiting determination from [facility].

## 2022-05-10 NOTE — Telephone Encounter (Signed)
Approved today Request Reference Number: [id number]. AJOVY INJ 225/1.5 is approved through 11/10/2022. Your patient may now fill this prescription and it will be covered.  Updated pt and pharmacy. Received a receipt of confirmation.

## 2022-05-10 NOTE — Progress Notes (Signed)
[facility]    Provider:  Dr Jaynee Eagles Requesting Provider: Donnajean Lopes, MD Primary Care Provider:  Donnajean Lopes, MD  CC:  migraines  Follow up 05/10/2022: Emgality samples have significantly helped. "Life changing".  Baseline headaches were At least 8 moderate to severe migraines a month, at least 15 headache days a month or more. She has had only had slight headache days since starting Emgality. When she does get a migraine it is mild. 4 migraine days a month and <7 total headache days a month. Emgality declined, trying to get Ajovy approved now. Gave her Ajovy samples. We are trying to get Ajovy now approved.   MRI brain: MRI brain (with and without) demonstrating; -Stable right frontal subcortical T2 hyperintensity measuring 6 to 7 mm.  Few other punctate foci of bilateral periventricular and subcortical T2 hyperintensities also noted and stable from prior study 2017. Findings are non-specific.  -No acute findings.  No abnormal enhancing lesions.  Overall no change from 04/19/2016.   Patient complains of symptoms per HPI as well as the following symptoms: mgraines . Pertinent negatives and positives per HPI. All others negative  (01/19/2022: Patients MRI had no changes,  Overall no change from 04/19/2016. That is great news, we saw one 6-59m right-sided white spot on the MRI in 2017 and that has not changed so very unlikely to be multiple sclerosis or anything otherwise abnormal). Emgality was declined. Ajovy was ordered but we did not get the PA, we are filling it out right now. If we can get it approved she can also [place] the [place]. Significant improvement on Ajovy, we gave hr samples. No side effects.   HPI:  Natalie VELIEis a 61y.o. female here as requested by PDonnajean Lopes MD for migraines. She has been getting headaches since college. It has been so bad she has been to the ED. At least 8 migraines a month, at least 15  headache days a month. She was recenty re-tested and does not have sleep apnea. Weather pressure changes cause a headache or migraine. She tries to drink enough water, she has examined her lifestyle, they are pulsating/poundig/throbbing, nausea, light and sound sensitivity, closng the lights and a dark room helps, cold helps, nausea and also vomits. 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 She tried topiramate and she had side effects and that is what they think caused her right ear hearing loss, she has extensively evaluated and seen Dr. KRoland Earland had an MRI of the brain. She last had an MRI and in 2017 there was an unusual lesion. She has morning and positional headaches. Hearing loss right ear. Headache can be worse positionally in the middle of the night has woken with severe headache, also dizziness and blurry vision. Headaches are worsening. No other focal neurologic deficits, associated symptoms, inciting events or modifiable factors.  Reviewed notes, labs and imaging from outside physicians, which showed:   05/2019: BUN 22, creat 0.91  Medications tried: Topiramate(cognitive problems), excedrin, propranolol contraindicated due to hypotension (she usually run about 1176systolic), verapamil(with hypotension), aspirin, celexa, amitriptyline/nortriptyline(se effects of sedation and cognitive problems), celexa, flexeril, prozac, mobic, medrol dosepak, metoprolol(hypotension), zofran, trazodone. Sumatriptan, Rizatriptan, candesartan(ontraindicated due to hypotension (she usually run about 1160systolic and she has had symptomatic hypotension with every other blood pressure medication she has tried even low dose), aimovig contraindicated due to constipation(IBS).   IMPRESSION: personally reviewed images and with patient 1.  Single prominent subcortical T2 hyperintensity in the right frontal operculum. The finding is nonspecific but can be seen in the setting of chronic microvascular  ischemia, a demyelinating process such as multiple sclerosis, vasculitis, complicated migraine headaches, or as the sequelae of a prior infectious or inflammatory process. 2. Otherwise normal MRI of the brain for age.  Review of Systems: Patient complains of symptoms per HPI as well as the following symptoms headaches. Pertinent negatives and positives per HPI. All others negative.   Social History   Socioeconomic History   Marital status: Single    Spouse name: Not on file   Number of children: 0   Years of education: college   Highest education level: Not on file  Occupational History    Employer: [employer]    Comment: Sales  Tobacco Use   Smoking status: Never   Smokeless tobacco: Never  Vaping Use   Vaping Use: Never used  Substance and Sexual Activity   Alcohol use: Yes    Alcohol/week: 1.0 standard drink    Types: 1 Glasses of wine per week    Comment: rarely   Drug use: No   Sexual activity: Yes    Birth control/protection: Surgical  Other Topics Concern   Not on file  Social History Narrative   Patient lives at home alone.   Patient works full time Press photographer.   Education college degree   Right handed   Caffeine one cup of coffee daily sometimes tea.   Social Determinants of Health   Financial Resource Strain: Not on file  Food Insecurity: Not on file  Transportation Needs: Not on file  Physical Activity: Not on file  Stress: Not on file  Social Connections: Not on file  Intimate Partner Violence: Not on file    Family History  Problem Relation Age of Onset   Alzheimer's disease Mother    Hypertension Mother    Diabetes Mother    Hypertension Father    Heart Problems Father        quadruple bypass   Hyperlipidemia Father    Migraines Sister    Diabetes Sister    Hypertension Sister    Hyperlipidemia Sister    Hyperlipidemia Brother    Hypertension Brother    Migraines Maternal Grandmother    Anuerysm Maternal Grandmother    Colon cancer  Maternal Grandfather    Hypertension Maternal Grandfather    Hyperlipidemia Maternal Grandfather    Diabetes Maternal Grandfather    Diabetes Paternal Grandmother    Hypertension Paternal Grandmother    Hyperlipidemia Paternal Grandmother    Lung cancer Paternal Grandfather     Past Medical History:  Diagnosis Date   ADD (attention deficit disorder)    Allergy    takes Allegra D daily prn allergies and Flonase prn allergies   Anxiety    takes Xanax prn anxiety   Arthritis    Bipolar 2 disorder (HCC)    takes Adderall daily   Chronic deafness in right ear    Chronic low back pain    Depression    takes Prozac daily   Dizziness    Hearing impaired    deaf in right ear   Hiatal hernia    History of fracture of nasal bone 07/01/2007   History of migraine    last one on 10/09/13   Hyperlipidemia    IBS (irritable bowel syndrome)    Insomnia    takes Trazodone nightly prn sleep   Laryngitis    LVH (  left ventricular hypertrophy) 04/05/2015   Mild, Noted on ECHO   Muscle spasms of neck    takes Flexeril daily prn muscle spasms   OSA on CPAP 05/14/2014   resolved after weight loss   Pleurisy    when in college   Pneumonia    walking pneumonia in college   PONV (postoperative nausea and vomiting)    Right ureteral stone    4 x 3 mm stone at the right ureterovesical junction    Shortness of breath    with exertion/sitting   TIA (transient ischemic attack) 03/2015   Toe fracture, right 04/2017   great toe   Urinary frequency    Viral infection    09/25/13-was given an inhaler d/t SOB;was on ZPak   Weakness    both hands    Patient Active Problem List   Diagnosis Date Noted   Migraine without aura and without status migrainosus, not intractable 05/12/2022   Atrophy of vagina 05/09/2022   Bacterial vaginosis 05/09/2022   Menopausal flushing 05/09/2022   Vaginal discharge 05/09/2022   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   Dizzy spells 10/11/2021    Encounter for general adult medical examination without abnormal findings 10/11/2021   Esophageal polyp 10/11/2021   Other seasonal allergic rhinitis 10/11/2021   Fibroma of tongue 09/30/2021   Impacted cerumen of left ear 06/09/2021   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) 06/09/2021   Dysphagia 02/08/2021   Hoarseness 02/08/2021   Laryngopharyngeal reflux 02/08/2021   Throat discomfort 02/08/2021   Acute pharyngitis 06/24/2020   Streptococcal pharyngitis 06/24/2020   Chronic kidney disease, stage 3a ([place]) 03/08/2020   Migraine 01/28/2020   Chest pain of uncertain etiology 44/12/270   Hyperglycemia 03/05/2017   Disorientation 04/19/2016   Facial weakness 04/19/2016   Fatigue 04/19/2016   Tinnitus 04/19/2016   Cough 12/02/2015   DDD (degenerative disc disease), cervical 07/02/2015   Anxiety disorder 04/19/2015   Palpitations 04/19/2015   Bipolar disorder ([place]) 04/05/2015   TIA (transient ischemic attack) 04/04/2015   Aphasia 04/04/2015   OSA on CPAP 05/14/2014   Neck pain 06/26/2013   Hyperlipidemia 12/25/2012   Amnesia 04/11/2012   Hearing loss 06/12/2011   Skin sensation disturbance 05/12/2010   Attention deficit hyperactivity disorder, 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   Chronic interstitial cystitis 04/18/2010   Diaphragmatic hernia 04/18/2010   Insomnia 04/18/2010   Irritable bowel syndrome 04/18/2010   Major depression, single episode 04/18/2010    Past Surgical History:  Procedure Laterality Date   ABDOMINAL HERNIA REPAIR     ABDOMINAL HYSTERECTOMY     bladder  stretch     BUNIONECTOMY     COCHLEAR IMPLANT Right    COLONOSCOPY     cyst removed from left arm      as a child   ELBOW FRACTURE SURGERY Right    fx nose repair     LEFT HEART CATH AND CORONARY ANGIOGRAPHY N/A 06/24/2019   Procedure: LEFT HEART CATH AND CORONARY ANGIOGRAPHY;  Surgeon: Adrian Prows, MD;  Location: [facility];  Service: Cardiovascular;  Laterality:  N/A;   NECK SURGERY  12/2015   tumor removed from left breast  57yr ago   UPPER GASTROINTESTINAL ENDOSCOPY      Current Outpatient Medications  Medication Sig Dispense Refill   ALPRAZolam (XANAX) 0.5 MG tablet Take 0.5 mg by mouth 2 (two) times daily as needed for anxiety or sleep (panic attacks).   1   amphetamine-dextroamphetamine (ADDERALL) 20  MG tablet Take 20 mg by mouth 3 (three) times daily as needed (adhd).      aspirin-acetaminophen-caffeine (EXCEDRIN MIGRAINE) 250-250-65 MG tablet Take 1-2 tablets by mouth every 6 (six) hours as needed for headache.     citalopram (CELEXA) 20 MG tablet citalopram 20 mg tablet     dicyclomine (BENTYL) 10 MG capsule Take 20 mg by mouth 3 (three) times daily as needed for spasms.   1   fluticasone (FLONASE) 50 MCG/ACT nasal spray Place 1-2 sprays into both nostrils daily as needed for allergies.      Fremanezumab-vfrm (AJOVY) 225 MG/1.5ML SOAJ Inject 225 mg into the skin every 30 (thirty) days. 1.5 mL 3   meloxicam (MOBIC) 15 MG tablet Take 1 tablet (15 mg total) by mouth daily. 30 tablet 3   ondansetron (ZOFRAN-ODT) 4 MG disintegrating tablet Take 1-2 tablets (4-8 mg total) by mouth every 8 (eight) hours as needed. 30 tablet 3   pravastatin (PRAVACHOL) 40 MG tablet Take 40 mg by mouth daily.     Rimegepant Sulfate (NURTEC) 75 MG TBDP Take 75 mg by mouth daily as needed. For migraines. Take as close to onset of migraine as possible. One daily maximum. 16 tablet 0   rizatriptan (MAXALT-MLT) 10 MG disintegrating tablet Take 1 tablet (10 mg total) by mouth as needed for migraine. May repeat in 2 hours if needed 9 tablet 11   zolpidem (AMBIEN) 10 MG tablet Take 5-10 mg by mouth at bedtime as needed.     No current facility-administered medications for this visit.    Allergies as of 05/10/2022 - Review Complete 05/10/2022  Allergen Reaction Noted   Oxycodone-acetaminophen  05/09/2022   Codeine  07/25/2021   Demerol Nausea And Vomiting 03/29/2011    Rosuvastatin  07/25/2021   Tetanus-diphtheria toxoids td  09/21/2021   Topamax [topiramate] Other (See Comments) 07/01/2012   Lamictal [lamotrigine] Rash 03/29/2011    Vitals: BP 140/90   Pulse 86   Ht '5\' 7"'$  (1.702 m)   Wt 204 lb (92.5 kg)   LMP 12/18/2005   BMI 31.95 kg/m  Last Weight:  Wt Readings from Last 1 Encounters:  05/12/22 202 lb 13.2 oz (92 kg)   Last Height:   Ht Readings from Last 1 Encounters:  05/12/22 '5\' 7"'$  (1.702 m)   Exam: NAD, pleasant                  Speech:    Speech is normal; fluent and spontaneous with normal comprehension.  Cognition:    The patient is oriented to person, place, and time;     recent and remote memory intact;     language fluent;    Cranial Nerves:    The pupils are equal, round, and reactive to light.Trigeminal sensation is intact and the muscles of mastication are normal. The face is symmetric. The palate elevates in the midline. Hearing intact. Voice is normal. Shoulder shrug is normal. The tongue has normal motion without fasciculations.   Coordination:  No dysmetria  Motor Observation:    No asymmetry, no atrophy, and no involuntary movements noted. Tone:    Normal muscle tone.     Strength:    Strength is V/V in the upper and lower limbs.      Sensation: intact to LT   Assessment/Plan:  Absolutely lovely patient with chronic migraines.   Follow up 05/10/2022: Emgality samples have significantly helped. "Life changing".  Baseline headaches were At least 8 moderate to severe migraines a  month, at least 15 headache days a month or more. She has had only had slight headache days since starting Emgality. When she does get a migraine it is mild. 4 migraine days a month and <7 total headache days a month. Emgality declined, trying to get Ajovy approved now. Gave her Ajovy samples. We are trying to get Ajovy now approved.  [email or web address] as needed for migraine/headache - look for a call from [place], also trying to get approved.   MRI brain (with and without) demonstrating; -Stable right frontal subcortical T2 hyperintensity measuring 6 to 7 mm.  Few other punctate foci of bilateral periventricular and subcortical T2 hyperintensities also noted and stable from prior study 2017. Findings are non-specific.  -No acute findings.  No abnormal enhancing lesions.  Overall no change from 04/19/2016.  She sees dr Sharlett Iles yearly for blood work, I don't have those but ensure he checked a TSH sees him again in a few months  Recommended [place]  I spent over 30 minutes of face-to-face and non-face-to-face time with patient on the  1. Chronic migraine without aura without status migrainosus, not intractable   2. Migraine without aura and without status migrainosus, not intractable    diagnosis.  This included previsit chart review, lab review, study review, order entry, electronic health record documentation, patient education on the different diagnostic and therapeutic options, counseling and coordination of care, risks and benefits of management, compliance, or risk factor reduction   Cc: Donnajean Lopes, MD,  Donnajean Lopes, MD  Sarina Ill, MD  [facility] [address] [place], [place] [address]  Phone [phone] Fax [phone]  I spent 72 minutes of face-to-face and non-face-to-face time with patient on the  1. Chronic migraine without aura without status migrainosus, not intractable   2. Migraine without aura and without status migrainosus, not intractable     diagnosis.  This included previsit chart review, lab review, study review, order entry, electronic health record documentation, patient education on the different diagnostic and therapeutic options, counseling and coordination of care, risks and benefits of management, compliance, or risk factor reduction

## 2022-05-12 ENCOUNTER — Encounter (HOSPITAL_COMMUNITY): Payer: Self-pay | Admitting: Emergency Medicine

## 2022-05-12 ENCOUNTER — Other Ambulatory Visit: Payer: Self-pay

## 2022-05-12 ENCOUNTER — Emergency Department (HOSPITAL_COMMUNITY)
Admission: EM | Admit: 2022-05-12 | Discharge: 2022-05-12 | Disposition: A | Discharge status: Home / Self Care | Payer: BC Managed Care – PPO | Attending: Emergency Medicine | Admitting: Emergency Medicine

## 2022-05-12 ENCOUNTER — Emergency Department (HOSPITAL_COMMUNITY): Payer: BC Managed Care – PPO

## 2022-05-12 DIAGNOSIS — I10 Essential (primary) hypertension: Secondary | ICD-10-CM | POA: Insufficient documentation

## 2022-05-12 DIAGNOSIS — R0602 Shortness of breath: Secondary | ICD-10-CM | POA: Insufficient documentation

## 2022-05-12 DIAGNOSIS — R079 Chest pain, unspecified: Secondary | ICD-10-CM | POA: Diagnosis not present

## 2022-05-12 DIAGNOSIS — R519 Headache, unspecified: Secondary | ICD-10-CM | POA: Insufficient documentation

## 2022-05-12 DIAGNOSIS — M549 Dorsalgia, unspecified: Secondary | ICD-10-CM | POA: Diagnosis not present

## 2022-05-12 DIAGNOSIS — M5489 Other dorsalgia: Secondary | ICD-10-CM | POA: Insufficient documentation

## 2022-05-12 DIAGNOSIS — F419 Anxiety disorder, unspecified: Secondary | ICD-10-CM | POA: Diagnosis not present

## 2022-05-12 DIAGNOSIS — R0789 Other chest pain: Secondary | ICD-10-CM | POA: Diagnosis not present

## 2022-05-12 DIAGNOSIS — G43009 Migraine without aura, not intractable, without status migrainosus: Secondary | ICD-10-CM | POA: Insufficient documentation

## 2022-05-12 LAB — BASIC METABOLIC PANEL
Anion gap: 11 (ref 5–15)
BUN: 15 mg/dL (ref 8–23)
CO2: 24 mmol/L (ref 22–32)
Calcium: 9.4 mg/dL (ref 8.9–10.3)
Chloride: 103 mmol/L (ref 98–111)
Creatinine, Ser: 0.97 mg/dL (ref 0.44–1.00)
GFR, Estimated: >60 mL/min (ref >60)
Glucose, Bld: 100 mg/dL — ABNORMAL HIGH (ref 70–99)
Potassium: 3.7 mmol/L (ref 3.5–5.1)
Sodium: 138 mmol/L (ref 135–145)

## 2022-05-12 LAB — CBC
HCT: 46.4 % — ABNORMAL HIGH (ref 36.0–46.0)
Hemoglobin: 14.6 g/dL (ref 12.0–15.0)
MCH: 29.3 pg (ref 26.0–34.0)
MCHC: 31.5 g/dL (ref 30.0–36.0)
MCV: 93 fL (ref 80.0–100.0)
Platelets: 324 10*3/uL (ref 150–400)
RBC: 4.99 MIL/uL (ref 3.87–5.11)
RDW: 13 % (ref 11.5–15.5)
WBC: 8.5 10*3/uL (ref 4.0–10.5)
nRBC: 0 % (ref 0.0–0.2)

## 2022-05-12 LAB — TROPONIN I (HIGH SENSITIVITY)
Troponin I (High Sensitivity): 5 ng/L (ref <18)
Troponin I (High Sensitivity): 3 ng/L (ref <18)

## 2022-05-12 MED ORDER — NURTEC 75 MG PO TBDP: 75.0000 mg | ORAL_TABLET | Freq: Every day | ORAL | 11 refills | Status: AC | PRN

## 2022-05-12 MED ORDER — LORAZEPAM 2 MG/ML IJ SOLN
0.5000 mg | Freq: Once | INTRAMUSCULAR | Status: AC
  Administered 2022-05-12: 0.5 mg via INTRAVENOUS
  Filled 2022-05-12: qty 1

## 2022-05-12 NOTE — Discharge Instructions (Addendum)
You were seen today for chest pain.  Your EKG and blood work was reassuring for no heart attack at this time.  This pain seems likely related to both musculoskeletal pain and anxiety.  You may use over-the-counter pain medication including ibuprofen or Tylenol as needed for pain.  I recommend that you follow-up with your primary care provider for further evaluation and management

## 2022-05-12 NOTE — ED Triage Notes (Addendum)
Patient here POV with intermittent chest pain that she noticed last week but went away over the weekend. Chest pain, sharp upper back pain middle to left ,headache, SHOB started again last night. Pt also does endorse recent stressors in her life and feels it could be related to this. Noticed BP has been elevated the last two days as well.

## 2022-05-12 NOTE — ED Provider Notes (Signed)
[facility] EMERGENCY DEPARTMENT Provider Note   CSN: [id number] Arrival date & time: 05/12/22  1025     History  Chief Complaint  Patient presents with   Chest Pain   Shortness of Breath    Natalie Le is a 61 y.o. female.  Patient presents to the emergency department with a chief complaint of intermittent chest pain that began approximately 1 week ago.  She states that she has chest pain in the upper left chest, occasional upper left-sided back pain, intermittent headache, and shortness of breath resumed last night.  The patient also complains of anxiety due to recent stressors in her life and feels this could potentially be causing her other symptoms.  She recently began taking Ajovy for her migraines.  She does endorse tenderness to palpation of the chest.  Also endorses pain in the back is exacerbated by movement.  Patient complains of chest pain, back pain, headache, shortness of breath, anxiety.  Denies abdominal pain, nausea, vomiting.  Past medical history significant for history of TIA, history of weakness, history of migraine, muscle spasms of the neck, bipolar 2 disorder, anxiety, LVH, history of dizziness, history of shortness of breath, depression, hearing impairment  HPI     Home Medications Prior to Admission medications   Medication Sig Start Date End Date Taking? Authorizing Provider  ALPRAZolam Duanne Moron) 0.5 MG tablet Take 0.5 mg by mouth 2 (two) times daily as needed for anxiety or sleep (panic attacks).  08/26/14   [provider]  amphetamine-dextroamphetamine (ADDERALL) 20 MG tablet Take 20 mg by mouth 3 (three) times daily as needed (adhd).     [provider]  aspirin-acetaminophen-caffeine (EXCEDRIN MIGRAINE) [phone] MG tablet Take 1-2 tablets by mouth every 6 (six) hours as needed for headache.    [provider]  citalopram (CELEXA) 20 MG tablet citalopram 20 mg tablet    [provider]  dicyclomine  (BENTYL) 10 MG capsule Take 20 mg by mouth 3 (three) times daily as needed for spasms.  08/15/18   [provider]  fluticasone (FLONASE) 50 MCG/ACT nasal spray Place 1-2 sprays into both nostrils daily as needed for allergies.  03/04/19   [provider]  Fremanezumab-vfrm (AJOVY) 225 MG/1.5ML SOAJ Inject 225 mg into the skin every 30 (thirty) days. 03/09/22   Melvenia Beam, MD  meloxicam (MOBIC) 15 MG tablet Take 1 tablet (15 mg total) by mouth daily. 03/22/22   Hyatt, Max T, DPM  ondansetron (ZOFRAN-ODT) 4 MG disintegrating tablet Take 1-2 tablets (4-8 mg total) by mouth every 8 (eight) hours as needed. 01/10/22   Melvenia Beam, MD  pravastatin (PRAVACHOL) 40 MG tablet Take 40 mg by mouth daily. 05/03/19   [provider]  Rimegepant Sulfate (NURTEC) 75 MG TBDP Take 75 mg by mouth daily as needed. For migraines. Take as close to onset of migraine as possible. One daily maximum. 01/10/22   Melvenia Beam, MD  rizatriptan (MAXALT-MLT) 10 MG disintegrating tablet Take 1 tablet (10 mg total) by mouth as needed for migraine. May repeat in 2 hours if needed 01/10/22   Melvenia Beam, MD  zolpidem (AMBIEN) 10 MG tablet Take 5-10 mg by mouth at bedtime as needed. 09/21/21   [provider]      Allergies    Oxycodone-acetaminophen, Codeine, Demerol, Rosuvastatin, Tetanus-diphtheria toxoids td, Topamax [topiramate], and Lamictal [lamotrigine]    Review of Systems   Review of Systems  Constitutional:  Negative for fever.  Eyes:  Negative for photophobia.  Respiratory:  Positive for shortness of breath.   Cardiovascular:  Positive for chest pain.  Gastrointestinal:  Negative for abdominal pain, constipation, diarrhea, nausea and vomiting.  Musculoskeletal:  Positive for back pain.  Neurological:  Positive for headaches.       Intermittent headaches   Physical Exam Updated Vital Signs BP 122/88 (BP Location: Right Arm)   Pulse 60   Temp (!) 97.5 F (36.4 C)    Resp 12   Ht '5\' 7"'$  (1.702 m)   Wt 92 kg   LMP 12/18/2005   SpO2 98%   BMI 31.77 kg/m  Physical Exam Vitals and nursing note reviewed.  Constitutional:      General: She is not in acute distress. HENT:     Head: Normocephalic and atraumatic.  Eyes:     Extraocular Movements: Extraocular movements intact.  Cardiovascular:     Rate and Rhythm: Normal rate and regular rhythm.     Heart sounds: Normal heart sounds.  Pulmonary:     Effort: Pulmonary effort is normal.     Breath sounds: Normal breath sounds.  Chest:     Chest wall: Tenderness present.     Comments: Mild tenderness to palpation of her left chest Abdominal:     Palpations: Abdomen is soft.     Tenderness: There is no abdominal tenderness.  Musculoskeletal:        General: Normal range of motion.     Cervical back: Normal range of motion and neck supple.     Right lower leg: No edema.     Left lower leg: No edema.     Comments: Patient endorses recreating pain in left back/shoulder region with movement of left arm  Skin:    General: Skin is warm and dry.  Neurological:     General: No focal deficit present.     Mental Status: She is alert.    ED Results / Procedures / Treatments   Labs (all labs ordered are listed, but only abnormal results are displayed) Labs Reviewed  BASIC METABOLIC PANEL - Abnormal; Notable for the following components:      Result Value   Glucose, Bld 100 (*)    All other components within normal limits  CBC - Abnormal; Notable for the following components:   HCT 46.4 (*)    All other components within normal limits  TROPONIN I (HIGH SENSITIVITY)  TROPONIN I (HIGH SENSITIVITY)    EKG None  Radiology DG Chest 2 View  Result Date: 05/12/2022 CLINICAL DATA:  Chest pain. EXAM: CHEST - 2 VIEW COMPARISON:  November 09, 2016. FINDINGS: The heart size and mediastinal contours are within normal limits. Both lungs are clear. The visualized skeletal structures are unremarkable.  IMPRESSION: No active cardiopulmonary disease. Electronically Signed   By: Marijo Conception M.D.   On: 05/12/2022 10:56    Procedures Procedures    Medications Ordered in ED Medications  LORazepam (ATIVAN) injection 0.5 mg (0.5 mg Intravenous Given 05/12/22 1345)    ED Course/ Medical Decision Making/ A&P                           Medical Decision Making Amount and/or Complexity of Data Reviewed Labs: ordered. Radiology: ordered.  Risk Prescription drug management.   This patient presents to the ED for concern of chest pain, this involves an extensive number of treatment options, and is a complaint that carries with it  a high risk of complications and morbidity.  The differential diagnosis includes ACS, PE, musculoskeletal pain, anxiety, and others   Co morbidities that complicate the patient evaluation  History of TIA, hypertension, anxiety   Additional history obtained:   External records from outside source obtained and reviewed including neurology notes documenting migraines   Lab Tests:  I Ordered, and personally interpreted labs.  The pertinent results include: Initial troponin 5, repeat troponin 3, CBC and BMP grossly normal   Imaging Studies ordered:  I ordered imaging studies including chest x-ray I independently visualized and interpreted imaging which showed no active cardiopulmonary disease I agree with the radiologist interpretation   Cardiac Monitoring: / EKG:  The patient was maintained on a cardiac monitor.  I personally viewed and interpreted the cardiac monitored which showed an underlying rhythm of: Sinus rhythm   Problem List / ED Course / Critical interventions / Medication management   I ordered medication including Ativan for anxiety Reevaluation of the patient after these medicines showed that the patient improved I have reviewed the patients home medicines and have made adjustments as needed   Test / Admission - Considered:  The  patient's pain improved drastically after Ativan administration.  This leads me to believe this is more than likely at least partially anxiety related.  She does have some tenderness with palpation and some pain with movement, which leads me to believe there is also a musculoskeletal component.  ACS is very unlikely given the troponins of 3 and EKG showing no acute ischemia.  The patient feels better on like to discharge home.  I see no indication for admission at this time.  This seems reasonable.  She may use NSAIDs and Tylenol as needed to help with pain.  Recommend follow-up with her primary care provider for further evaluation and management as needed. } Final Clinical Impression(s) / ED Diagnoses Final diagnoses:  Chest wall pain  Anxiety  Other acute back pain    Rx / DC Orders ED Discharge Orders     None         Ronny Bacon 05/12/22 1407    Milton Ferguson, MD 05/13/22 1049

## 2022-05-16 ENCOUNTER — Telehealth: Payer: Self-pay | Admitting: *Deleted

## 2022-05-16 NOTE — Telephone Encounter (Signed)
Completed Nurtec PA on Cover My Meds. Key: [id number]. Awaiting determination from [facility].

## 2022-05-16 NOTE — Telephone Encounter (Signed)
Approved today Request Reference Number: [id number]. NURTEC TAB '75MG'$  ODT is approved through 08/16/2022. Your patient may now fill this prescription and it will be covered.   Approval letter faxed to [place].

## 2022-06-14 DIAGNOSIS — F3181 Bipolar II disorder: Secondary | ICD-10-CM | POA: Diagnosis not present

## 2022-06-14 DIAGNOSIS — F410 Panic disorder [episodic paroxysmal anxiety] without agoraphobia: Secondary | ICD-10-CM | POA: Diagnosis not present

## 2022-06-14 DIAGNOSIS — F900 Attention-deficit hyperactivity disorder, predominantly inattentive type: Secondary | ICD-10-CM | POA: Diagnosis not present

## 2022-06-15 DIAGNOSIS — Z9621 Cochlear implant status: Secondary | ICD-10-CM | POA: Diagnosis not present

## 2022-06-15 DIAGNOSIS — H6122 Impacted cerumen, left ear: Secondary | ICD-10-CM | POA: Diagnosis not present

## 2022-06-15 DIAGNOSIS — H903 Sensorineural hearing loss, bilateral: Secondary | ICD-10-CM | POA: Diagnosis not present

## 2022-07-18 ENCOUNTER — Encounter: Payer: Self-pay | Admitting: Neurology

## 2022-07-27 ENCOUNTER — Encounter: Payer: Self-pay | Admitting: Podiatry

## 2022-07-27 ENCOUNTER — Other Ambulatory Visit: Payer: Self-pay | Admitting: Podiatry

## 2022-07-27 ENCOUNTER — Ambulatory Visit (INDEPENDENT_AMBULATORY_CARE_PROVIDER_SITE_OTHER): Payer: BC Managed Care – PPO | Admitting: Podiatry

## 2022-07-27 DIAGNOSIS — G5781 Other specified mononeuropathies of right lower limb: Secondary | ICD-10-CM

## 2022-07-27 DIAGNOSIS — G5780 Other specified mononeuropathies of unspecified lower limb: Secondary | ICD-10-CM

## 2022-07-27 DIAGNOSIS — G5782 Other specified mononeuropathies of left lower limb: Secondary | ICD-10-CM

## 2022-07-27 NOTE — Progress Notes (Signed)
She presents today for follow-up of her neuromas bilaterally.  She states that she is having some trouble with his left foot and this really been tingling.  She enjoyed her trip to [place].  Her toes did fine during that time.  Objective: Vital signs are stable alert oriented x 3 pulses are palpable.  She has still has palpable neuromas to the third interspace bilateral foot.  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  Plan: Inject another dose of dehydrated alcohol today bilaterally 4% dehydrated alcohol a total of 1.5 cc each was injected.  We would like to try this for the next 3 months to see if we get any headway if not then we will consider neurectomy by the end of the year.

## 2022-08-08 ENCOUNTER — Other Ambulatory Visit: Payer: Self-pay

## 2022-08-08 ENCOUNTER — Encounter (HOSPITAL_COMMUNITY): Payer: Self-pay

## 2022-08-08 ENCOUNTER — Emergency Department (HOSPITAL_COMMUNITY)
Admission: EM | Admit: 2022-08-08 | Discharge: 2022-08-08 | Disposition: A | Discharge status: Home / Self Care | Payer: BC Managed Care – PPO | Attending: Emergency Medicine | Admitting: Emergency Medicine

## 2022-08-08 ENCOUNTER — Emergency Department (HOSPITAL_COMMUNITY): Payer: BC Managed Care – PPO

## 2022-08-08 ENCOUNTER — Ambulatory Visit (HOSPITAL_COMMUNITY)
Admission: EM | Admit: 2022-08-08 | Discharge: 2022-08-08 | Disposition: A | Discharge status: Home / Self Care | Payer: BC Managed Care – PPO

## 2022-08-08 DIAGNOSIS — R0602 Shortness of breath: Secondary | ICD-10-CM | POA: Diagnosis not present

## 2022-08-08 DIAGNOSIS — R6884 Jaw pain: Secondary | ICD-10-CM

## 2022-08-08 DIAGNOSIS — R0789 Other chest pain: Secondary | ICD-10-CM | POA: Diagnosis not present

## 2022-08-08 DIAGNOSIS — R079 Chest pain, unspecified: Secondary | ICD-10-CM | POA: Diagnosis not present

## 2022-08-08 DIAGNOSIS — R110 Nausea: Secondary | ICD-10-CM | POA: Diagnosis not present

## 2022-08-08 DIAGNOSIS — R42 Dizziness and giddiness: Secondary | ICD-10-CM | POA: Insufficient documentation

## 2022-08-08 LAB — BASIC METABOLIC PANEL
Anion gap: 8 (ref 5–15)
BUN: 11 mg/dL (ref 8–23)
CO2: 23 mmol/L (ref 22–32)
Calcium: 9.2 mg/dL (ref 8.9–10.3)
Chloride: 108 mmol/L (ref 98–111)
Creatinine, Ser: 0.88 mg/dL (ref 0.44–1.00)
GFR, Estimated: >60 mL/min (ref >60)
Glucose, Bld: 104 mg/dL — ABNORMAL HIGH (ref 70–99)
Potassium: 4.2 mmol/L (ref 3.5–5.1)
Sodium: 139 mmol/L (ref 135–145)

## 2022-08-08 LAB — CBC
HCT: 41.9 % (ref 36.0–46.0)
Hemoglobin: 13.7 g/dL (ref 12.0–15.0)
MCH: 29.7 pg (ref 26.0–34.0)
MCHC: 32.7 g/dL (ref 30.0–36.0)
MCV: 90.9 fL (ref 80.0–100.0)
Platelets: 279 10*3/uL (ref 150–400)
RBC: 4.61 MIL/uL (ref 3.87–5.11)
RDW: 13.2 % (ref 11.5–15.5)
WBC: 6.2 10*3/uL (ref 4.0–10.5)
nRBC: 0 % (ref 0.0–0.2)

## 2022-08-08 LAB — LIPASE, BLOOD: Lipase: 25 U/L (ref 11–51)

## 2022-08-08 LAB — HEPATIC FUNCTION PANEL
ALT: 16 U/L (ref 0–44)
AST: 17 U/L (ref 15–41)
Albumin: 4 g/dL (ref 3.5–5.0)
Alkaline Phosphatase: 68 U/L (ref 38–126)
Bilirubin, Direct: 0.1 mg/dL (ref 0.0–0.2)
Indirect Bilirubin: 1.2 mg/dL — ABNORMAL HIGH (ref 0.3–0.9)
Total Bilirubin: 1.3 mg/dL — ABNORMAL HIGH (ref 0.3–1.2)
Total Protein: 6.9 g/dL (ref 6.5–8.1)

## 2022-08-08 LAB — TROPONIN I (HIGH SENSITIVITY)
Troponin I (High Sensitivity): 3 ng/L (ref <18)
Troponin I (High Sensitivity): 3 ng/L (ref <18)

## 2022-08-08 MED ORDER — ONDANSETRON 4 MG PO TBDP
4.0000 mg | ORAL_TABLET | Freq: Once | ORAL | Status: AC
Start: 2022-08-08 — End: 2022-08-08
  Administered 2022-08-08: 4 mg via ORAL
  Filled 2022-08-08: qty 1

## 2022-08-08 MED ORDER — FAMOTIDINE 20 MG PO TABS
20.0000 mg | ORAL_TABLET | Freq: Once | ORAL | Status: AC
  Administered 2022-08-08: 20 mg via ORAL
  Filled 2022-08-08: qty 1

## 2022-08-08 NOTE — Discharge Instructions (Addendum)
D/C to ED via POV

## 2022-08-08 NOTE — ED Triage Notes (Signed)
Pt woke up with jaw pain and chest pain today

## 2022-08-08 NOTE — ED Provider Notes (Signed)
[facility] EMERGENCY DEPARTMENT Provider Note   CSN: [id number] Arrival date & time: 08/08/22  0844     History  Chief Complaint  Patient presents with   Chest Pain    Natalie Le is a 61 y.o. female.  Patient is a 61 year old female with a past medical history of anxiety and peptic ulcer disease presenting to the emergency department with chest pain.  The patient states that she woke up this morning and initially felt well and after about 30 minutes started to get chest pressure in her epigastrium through her mid chest that radiated to the right side of her jaw.  She states she had associated lightheadedness and nausea.  She denies any vomiting or shortness of breath, fevers or chills or cough.  She states that she initially went to urgent care who recommended that she come to the ER for further evaluation.  She states that her pain lasted for about 7 minutes and resolved on its own but every time she lays flat she does feel some mild pain and nausea.  The history is provided by the patient and the spouse.  Chest Pain      Home Medications Prior to Admission medications   Medication Sig Start Date End Date Taking? Authorizing Provider  ALPRAZolam Duanne Moron) 0.5 MG tablet Take 0.5 mg by mouth 2 (two) times daily as needed for anxiety or sleep (panic attacks).  08/26/14   [provider]  amphetamine-dextroamphetamine (ADDERALL) 20 MG tablet Take 20 mg by mouth 3 (three) times daily as needed (adhd).     [provider]  aspirin-acetaminophen-caffeine (EXCEDRIN MIGRAINE) [phone] MG tablet Take 1-2 tablets by mouth every 6 (six) hours as needed for headache.    [provider]  citalopram (CELEXA) 20 MG tablet citalopram 20 mg tablet    [provider]  dicyclomine (BENTYL) 10 MG capsule Take 20 mg by mouth 3 (three) times daily as needed for spasms.  08/15/18   [provider]  fluticasone (FLONASE) 50 MCG/ACT nasal  spray Place 1-2 sprays into both nostrils daily as needed for allergies.  03/04/19   [provider]  Fremanezumab-vfrm (AJOVY) 225 MG/1.5ML SOAJ Inject 225 mg into the skin every 30 (thirty) days. 03/09/22   Melvenia Beam, MD  meloxicam (MOBIC) 15 MG tablet TAKE 1 TABLET BY MOUTH EVERY DAY 07/27/22   Hyatt, Max T, DPM  ondansetron (ZOFRAN-ODT) 4 MG disintegrating tablet Take 1-2 tablets (4-8 mg total) by mouth every 8 (eight) hours as needed. 01/10/22   Melvenia Beam, MD  pravastatin (PRAVACHOL) 40 MG tablet Take 40 mg by mouth daily. 05/03/19   [provider]  Rimegepant Sulfate (NURTEC) 75 MG TBDP Take 75 mg by mouth daily as needed. For migraines. Take as close to onset of migraine as possible. One daily maximum. 05/12/22   Melvenia Beam, MD  rizatriptan (MAXALT-MLT) 10 MG disintegrating tablet Take 1 tablet (10 mg total) by mouth as needed for migraine. May repeat in 2 hours if needed 01/10/22   Melvenia Beam, MD  zolpidem (AMBIEN) 10 MG tablet Take 5-10 mg by mouth at bedtime as needed. 09/21/21   [provider]      Allergies    Oxycodone-acetaminophen, Codeine, Demerol, Rosuvastatin, Tetanus-diphtheria toxoids td, Topamax [topiramate], and Lamictal [lamotrigine]    Review of Systems   Review of Systems  Cardiovascular:  Positive for chest pain.    Physical Exam Updated Vital Signs BP (!) 142/91 (BP  Location: Right Arm)   Pulse 65   Temp 97.7 F (36.5 C) (Oral)   Resp 16   Ht '5\' 7"'$  (1.702 m)   Wt 90.7 kg   LMP 12/18/2005   SpO2 100%   BMI 31.32 kg/m  Physical Exam Vitals and nursing note reviewed.  Constitutional:      General: She is not in acute distress.    Appearance: She is well-developed.  HENT:     Head: Normocephalic.  Eyes:     Extraocular Movements: Extraocular movements intact.  Cardiovascular:     Rate and Rhythm: Normal rate and regular rhythm.     Pulses:          Radial pulses are 2+ on the right side and 2+ on the left  side.       Dorsalis pedis pulses are 2+ on the right side and 2+ on the left side.     Heart sounds: Normal heart sounds.  Pulmonary:     Effort: Pulmonary effort is normal.     Breath sounds: Normal breath sounds.  Chest:     Chest wall: No tenderness.  Abdominal:     Palpations: Abdomen is soft.     Tenderness: There is no abdominal tenderness.  Musculoskeletal:     Cervical back: Normal range of motion and neck supple.     Right lower leg: No edema.     Left lower leg: No edema.  Skin:    General: Skin is warm and dry.  Neurological:     General: No focal deficit present.     Mental Status: She is alert and oriented to person, place, and time.  Psychiatric:        Mood and Affect: Mood normal.        Behavior: Behavior normal.     ED Results / Procedures / Treatments   Labs (all labs ordered are listed, but only abnormal results are displayed) Labs Reviewed  BASIC METABOLIC PANEL - Abnormal; Notable for the following components:      Result Value   Glucose, Bld 104 (*)    All other components within normal limits  HEPATIC FUNCTION PANEL - Abnormal; Notable for the following components:   Total Bilirubin 1.3 (*)    Indirect Bilirubin 1.2 (*)    All other components within normal limits  CBC  LIPASE, BLOOD  TROPONIN I (HIGH SENSITIVITY)  TROPONIN I (HIGH SENSITIVITY)    EKG EKG Interpretation  Date/Time:  Tuesday August 08 2022 08:52:27 EDT Ventricular Rate:  92 PR Interval:  150 QRS Duration: 66 QT Interval:  364 QTC Calculation: 450 R Axis:   -19 Text Interpretation: Normal sinus rhythm Inferior infarct , age undetermined Anterior infarct , age undetermined Abnormal ECG No significant change since last tracing Confirmed by Oneal Deputy [phone]) on 08/08/2022 10:53:52 AM  Radiology DG Chest 2 View  Result Date: 08/08/2022 CLINICAL DATA:  Chest pain EXAM: CHEST - 2 VIEW COMPARISON:  05/12/2022 FINDINGS: The heart size and mediastinal contours are within  normal limits. Both lungs are clear. The visualized skeletal structures are unremarkable. IMPRESSION: No active cardiopulmonary disease. Electronically Signed   By: Davina Poke D.O.   On: 08/08/2022 11:30    Procedures Procedures    Medications Ordered in ED Medications  famotidine (PEPCID) tablet 20 mg (20 mg Oral Given 08/08/22 1137)  ondansetron (ZOFRAN-ODT) disintegrating tablet 4 mg (4 mg Oral Given 08/08/22 1137)    ED Course/ Medical Decision Making/ A&P Clinical Course  as of 08/08/22 1312  Tue Aug 08, 2022  1236 LFTs show mildly elevated bili of 1.3, otherwise normal and lactate is normal.  Repeat troponin is negative making ACS unlikely cause of her chest pain. [VK]  1311 Patient reports improvement of her chest pain and nausea after the Zofran and Pepcid.  She is amenable to discharge with outpatient primary care follow-up and her questions were answered. [VK]    Clinical Course User Index [VK] Ottie Glazier, DO                           Medical Decision Making This patient presents to the ED with chief complaint(s) of chest pain with pertinent past medical history of anxiety, PUD which further complicates the presenting complaint. The complaint involves an extensive differential diagnosis and also carries with it a high risk of complications and morbidity.    The differential diagnosis includes ACS, arrhythmia, gastritis, GERD, PUD, pneumonia or infectious etiology unlikely due to no recent fever or cough, no signs of volume overload making CHF or volume overload unlikely, unlikely pneumothorax as patient is pain resolved on its own  Additional history obtained: Additional history obtained from family Records reviewed urgent care records  ED Course and Reassessment: Patient was initially evaluated by triage and had labs, EKG and chest x-ray performed.  EKG had no acute ischemic changes and initial troponin is negative.  Patient's chest x-ray showed no acute  disease.  She will repeat troponin as well as LFTs and lipase to evaluate for pancreatitis, hepatitis as causes of her pain.  Hemoglobin is stable making a bleed peptic ulcer unlikely.  She will be trialed Pepcid and Zofran for her symptoms and be reassessed.  Independent labs interpretation:  The following labs were independently interpreted: Within normal range  Independent visualization of imaging: - I independently visualized the following imaging with scope of interpretation limited to determining acute life threatening conditions related to emergency care: Chest x-ray, which revealed no acute disease  Consultation: - Consulted or discussed management/test interpretation w/ external professional: N/A  Consideration for admission or further workup: Patient has no emergent conditions that require admission at this time.  She is stable for outpatient follow-up. Social Determinants of health: N/A    Amount and/or Complexity of Data Reviewed Labs: ordered. Radiology: ordered.  Risk Prescription drug management.          Final Clinical Impression(s) / ED Diagnoses Final diagnoses:  None    Rx / DC Orders ED Discharge Orders     None         Ottie Glazier, DO 08/08/22 1313

## 2022-08-08 NOTE — Discharge Instructions (Signed)
You were seen in the emergency department for your chest pain.  It may be related to gastritis or reflux as you had improvement with an antacid.  You had no signs of heart attack, abnormal heart rhythms, anemia, pneumonia or fluid on your lungs as causes of your pain.  You should follow-up with your primary doctor in the next few days to have your symptoms rechecked.  You should return to the emergency department if your chest pain gets significantly worse, you have repetitive vomiting, you pass out or if you have any other new or concerning symptoms.

## 2022-08-08 NOTE — ED Triage Notes (Signed)
Pt arrived POV from home c/o centralized CP that started at 730 this morning that radiated to her right jaw and also endorsed some SHOB. Pt states she felt like she had some has and did some burping on the way here and now the pain has resolved.

## 2022-08-08 NOTE — ED Provider Notes (Signed)
Presents with chest pain and shortness of breath. Patient reports she woke this morning with severe 10/10 pain and tightness in the chest that spread to her jaw and left arm.  Reports she was on her way to the emergency department when symptoms started to resolve.  She decided to come to the urgent care. Reports symptoms are still there but improved.  EKG obtained in clinic with normal sinus rhythm, ventricular rate of 68 bpm. Patient overall is well-appearing apart from anxiety and some pressured speech.  Discussed with patient with any severe chest pain like this she needs to be evaluated in the emergency department.  Discussed that urgent care does not have the resources and capabilities to fully evaluate her and rule out possible cardiac emergency.  Patient verbalizes understanding.  I did offer EMS transport but she declined and will go via personal vehicle.  Discharged in stable condition given the resources available here.   Kassie Keng, [place], [place] 08/08/22 [phone]

## 2022-08-17 NOTE — Telephone Encounter (Signed)
Approved today Request Reference Number: [id number]. NURTEC TAB '75MG'$  ODT is approved through 08/18/2023. Your patient may now fill this prescription and it will be covered.

## 2022-08-17 NOTE — Telephone Encounter (Signed)
Completed renewal PA on Cover My Meds. Key: [id number]. Awaiting determination from [facility].

## 2022-08-29 ENCOUNTER — Ambulatory Visit: Payer: BC Managed Care – PPO | Admitting: Podiatry

## 2022-11-13 ENCOUNTER — Ambulatory Visit (INDEPENDENT_AMBULATORY_CARE_PROVIDER_SITE_OTHER): Payer: Self-pay | Admitting: Neurology

## 2022-11-13 DIAGNOSIS — G43709 Chronic migraine without aura, not intractable, without status migrainosus: Secondary | ICD-10-CM

## 2022-11-13 MED ORDER — RIZATRIPTAN BENZOATE 10 MG PO TBDP: 10.0000 mg | ORAL_TABLET | ORAL | 11 refills | Status: AC | PRN

## 2022-11-13 MED ORDER — EMGALITY 120 MG/ML ~~LOC~~ SOAJ: 120.0000 mg | SUBCUTANEOUS | 0 refills | Status: AC

## 2022-11-13 NOTE — Progress Notes (Signed)
[id number]    Provider:  Dr Jaynee Eagles Requesting Provider: Donnajean Lopes, MD Primary Care Provider:  Donnajean Lopes, MD  CC:  migraines Virtual Visit via Telephone Note  I connected with Janie Morning on 11/13/22 at  3:30 PM EST by telephone and verified that I am speaking with the correct person using two identifiers.  Location: Patient: office Provider: home   I discussed the limitations, risks, security and privacy concerns of performing an evaluation and management service by telephone and the availability of in person appointments. I also discussed with the patient that there may be a patient responsible charge related to this service. The patient expressed understanding and agreed to proceed.   Follow Up Instructions:    I discussed the assessment and treatment plan with the patient. The patient was provided an opportunity to ask questions and all were answered. The patient agreed with the plan and demonstrated an understanding of the instructions.   The patient was advised to call back or seek an in-person evaluation if the symptoms worsen or if the condition fails to improve as anticipated.  I provided 12 minutes of non-face-to-face time during this encounter.   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 Will leave samples as she is getting new insurance in [place] she will call us and we will prescribe emgality.   Patient complains of symptoms per HPI as well as the following symptoms: migraine . Pertinent negatives and positives per HPI. All others negative   Follow up 05/10/2022: Emgality samples have significantly helped. "Life changing".  Baseline headaches were At least 8 moderate to severe migraines a month, at least 15 headache days a month or more. She has had only had slight headache days since starting Emgality. When she does  get a migraine it is mild. 4 migraine days a month and <7 total headache days a month. Emgality declined, trying to get Ajovy approved now. Gave her Ajovy samples. We are trying to get Ajovy now approved.   MRI brain: MRI brain (with and without) demonstrating; -Stable right frontal subcortical T2 hyperintensity measuring 6 to 7 mm.  Few other punctate foci of bilateral periventricular and subcortical T2 hyperintensities also noted and stable from prior study 2017. Findings are non-specific.  -No acute findings.  No abnormal enhancing lesions.  Overall no change from 04/19/2016.   Patient complains of symptoms per HPI as well as the following symptoms: mgraines . Pertinent negatives and positives per HPI. All others negative  (01/19/2022: Patients MRI had no changes,  Overall no change from 04/19/2016. That is great news, we saw one 6-2m right-sided white spot on the MRI in 2017 and that has not changed so very unlikely to be multiple sclerosis or anything otherwise abnormal). Emgality was declined. Ajovy was ordered but we did not get the PA, we are filling it out right now. If we can get it approved she can [place] the [place]. Significant improvement on Ajovy, we gave hr samples. No side effects.   HPI:  CKYNSLEE BAHAMis a 61y.o. female here as requested by PDonnajean Lopes MD for migraines. She has been getting headaches since college. It has been so bad she has been to the ED. At least 8 migraines a month, at least 15 headache days a month. She was recenty re-tested and does not have sleep  apnea. Weather pressure changes cause a headache or migraine. She tries to drink enough water, she has examined her lifestyle, they are pulsating/poundig/throbbing, nausea, light and sound sensitivity, closng the lights and a dark room helps, cold helps, nausea and also vomits. 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 She tried topiramate and she had side  effects and that is what they think caused her right ear hearing loss, she has extensively evaluated and seen Dr. Roland Earl and had an MRI of the brain. She last had an MRI and in 2017 there was an unusual lesion. She has morning and positional headaches. Hearing loss right ear. Headache can be worse positionally in the middle of the night has woken with severe headache, also dizziness and blurry vision. Headaches are worsening. No other focal neurologic deficits, associated symptoms, inciting events or modifiable factors.  Reviewed notes, labs and imaging from outside physicians, which showed:   05/2019: BUN 22, creat 0.91  Medications tried: Topiramate(cognitive problems), excedrin, propranolol contraindicated due to hypotension (she usually run about 191 systolic), verapamil(with hypotension), aspirin, celexa, amitriptyline/nortriptyline(se effects of sedation and cognitive problems), celexa, flexeril, prozac, mobic, medrol dosepak, metoprolol(hypotension), zofran, trazodone. Sumatriptan, Rizatriptan, candesartan(ontraindicated due to hypotension (she usually run about 478 systolic and she has had symptomatic hypotension with every other blood pressure medication she has tried even low dose), aimovig contraindicated due to constipation(IBS).   IMPRESSION: personally reviewed images and with patient 1. Single prominent subcortical T2 hyperintensity in the right frontal operculum. The finding is nonspecific but can be seen in the setting of chronic microvascular ischemia, a demyelinating process such as multiple sclerosis, vasculitis, complicated migraine headaches, or as the sequelae of a prior infectious or inflammatory process. 2. Otherwise normal MRI of the brain for age.  Review of Systems: Patient complains of symptoms per HPI as well as the following symptoms headaches. Pertinent negatives and positives per HPI. All others negative.   Social History   Socioeconomic History   Marital  status: Single    Spouse name: Not on file   Number of children: 0   Years of education: college   Highest education level: Not on file  Occupational History    Employer: [employer]    Comment: Sales  Tobacco Use   Smoking status: Never   Smokeless tobacco: Never  Vaping Use   Vaping Use: Never used  Substance and Sexual Activity   Alcohol use: Yes    Alcohol/week: 1.0 standard drink of alcohol    Types: 1 Glasses of wine per week    Comment: rarely   Drug use: No   Sexual activity: Yes    Birth control/protection: Surgical  Other Topics Concern   Not on file  Social History Narrative   Patient lives at home alone.   Patient works full time Press photographer.   Education college degree   Right handed   Caffeine one cup of coffee daily sometimes tea.   Social Determinants of Health   Financial Resource Strain: Not on file  Food Insecurity: Not on file  Transportation Needs: Not on file  Physical Activity: Not on file  Stress: Not on file  Social Connections: Not on file  Intimate Partner Violence: Not on file    Family History  Problem Relation Age of Onset   Alzheimer's disease Mother    Hypertension Mother    Diabetes Mother    Hypertension Father    Heart Problems Father        quadruple bypass  Hyperlipidemia Father    Migraines Sister    Diabetes Sister    Hypertension Sister    Hyperlipidemia Sister    Hyperlipidemia Brother    Hypertension Brother    Migraines Maternal Grandmother    Anuerysm Maternal Grandmother    Colon cancer Maternal Grandfather    Hypertension Maternal Grandfather    Hyperlipidemia Maternal Grandfather    Diabetes Maternal Grandfather    Diabetes Paternal Grandmother    Hypertension Paternal Grandmother    Hyperlipidemia Paternal Grandmother    Lung cancer Paternal Grandfather     Past Medical History:  Diagnosis Date   ADD (attention deficit disorder)    Allergy    takes Allegra D daily prn allergies and Flonase prn  allergies   Anxiety    takes Xanax prn anxiety   Arthritis    Bipolar 2 disorder (HCC)    takes Adderall daily   Chronic deafness in right ear    Chronic low back pain    Depression    takes Prozac daily   Dizziness    Hearing impaired    deaf in right ear   Hiatal hernia    History of fracture of nasal bone 07/01/2007   History of migraine    last one on 10/09/13   Hyperlipidemia    IBS (irritable bowel syndrome)    Insomnia    takes Trazodone nightly prn sleep   Laryngitis    LVH (left ventricular hypertrophy) 04/05/2015   Mild, Noted on ECHO   Muscle spasms of neck    takes Flexeril daily prn muscle spasms   OSA on CPAP 05/14/2014   resolved after weight loss   Pleurisy    when in college   Pneumonia    walking pneumonia in college   PONV (postoperative nausea and vomiting)    Right ureteral stone    4 x 3 mm stone at the right ureterovesical junction    Shortness of breath    with exertion/sitting   TIA (transient ischemic attack) 03/2015   Toe fracture, right 04/2017   great toe   Urinary frequency    Viral infection    09/25/13-was given an inhaler d/t SOB;was on ZPak   Weakness    both hands    Patient Active Problem List   Diagnosis Date Noted   Migraine without aura and without status migrainosus, not intractable 05/12/2022   Atrophy of vagina 05/09/2022   Bacterial vaginosis 05/09/2022   Menopausal flushing 05/09/2022   Vaginal discharge 05/09/2022   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   Dizzy spells 10/11/2021   Encounter for general adult medical examination without abnormal findings 10/11/2021   Esophageal polyp 10/11/2021   Other seasonal allergic rhinitis 10/11/2021   Fibroma of tongue 09/30/2021   Impacted cerumen of left ear 06/09/2021   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) 06/09/2021   Dysphagia 02/08/2021   Hoarseness 02/08/2021   Laryngopharyngeal reflux 02/08/2021   Throat discomfort 02/08/2021    Acute pharyngitis 06/24/2020   Streptococcal pharyngitis 06/24/2020   Chronic kidney disease, stage 3a ([place]) 03/08/2020   Migraine 01/28/2020   Chest pain of uncertain etiology 50/08/3817   Hyperglycemia 03/05/2017   Disorientation 04/19/2016   Facial weakness 04/19/2016   Fatigue 04/19/2016   Tinnitus 04/19/2016   Cough 12/02/2015   DDD (degenerative disc disease), cervical 07/02/2015   Anxiety disorder 04/19/2015   Palpitations 04/19/2015   Bipolar disorder ([place]) 04/05/2015   TIA (transient ischemic attack) 04/04/2015  Aphasia 04/04/2015   OSA on CPAP 05/14/2014   Neck pain 06/26/2013   Hyperlipidemia 12/25/2012   Amnesia 04/11/2012   Hearing loss 06/12/2011   Skin sensation disturbance 05/12/2010   Attention deficit hyperactivity disorder, 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   Chronic interstitial cystitis 04/18/2010   Diaphragmatic hernia 04/18/2010   Insomnia 04/18/2010   Irritable bowel syndrome 04/18/2010   Major depression, single episode 04/18/2010    Past Surgical History:  Procedure Laterality Date   ABDOMINAL HERNIA REPAIR     ABDOMINAL HYSTERECTOMY     bladder  stretch     BUNIONECTOMY     COCHLEAR IMPLANT Right    COLONOSCOPY     cyst removed from left arm      as a child   ELBOW FRACTURE SURGERY Right    fx nose repair     LEFT HEART CATH AND CORONARY ANGIOGRAPHY N/A 06/24/2019   Procedure: LEFT HEART CATH AND CORONARY ANGIOGRAPHY;  Surgeon: Adrian Prows, MD;  Location: [facility];  Service: Cardiovascular;  Laterality: N/A;   NECK SURGERY  12/2015   tumor removed from left breast  12yr ago   UPPER GASTROINTESTINAL ENDOSCOPY      Current Outpatient Medications  Medication Sig Dispense Refill   Galcanezumab-gnlm (EMGALITY) 120 MG/ML SOAJ Inject 120 mg into the skin every 30 (thirty) days. 2 mL 0   ALPRAZolam (XANAX) 0.5 MG tablet Take 0.5 mg by mouth 2 (two) times daily as needed for anxiety or sleep (panic attacks).   1    amphetamine-dextroamphetamine (ADDERALL) 20 MG tablet Take 20 mg by mouth 3 (three) times daily as needed (adhd).      aspirin-acetaminophen-caffeine (EXCEDRIN MIGRAINE) 250-250-65 MG tablet Take 1-2 tablets by mouth every 6 (six) hours as needed for headache.     citalopram (CELEXA) 20 MG tablet citalopram 20 mg tablet     dicyclomine (BENTYL) 10 MG capsule Take 20 mg by mouth 3 (three) times daily as needed for spasms.   1   fluticasone (FLONASE) 50 MCG/ACT nasal spray Place 1-2 sprays into both nostrils daily as needed for allergies.      meloxicam (MOBIC) 15 MG tablet TAKE 1 TABLET BY MOUTH EVERY DAY 30 tablet 3   ondansetron (ZOFRAN-ODT) 4 MG disintegrating tablet Take 1-2 tablets (4-8 mg total) by mouth every 8 (eight) hours as needed. 30 tablet 3   pravastatin (PRAVACHOL) 40 MG tablet Take 40 mg by mouth daily.     Rimegepant Sulfate (NURTEC) 75 MG TBDP Take 75 mg by mouth daily as needed. For migraines. Take as close to onset of migraine as possible. One daily maximum. 16 tablet 11   rizatriptan (MAXALT-MLT) 10 MG disintegrating tablet Take 1 tablet (10 mg total) by mouth as needed for migraine. May repeat in 2 hours if needed 9 tablet 11   zolpidem (AMBIEN) 10 MG tablet Take 5-10 mg by mouth at bedtime as needed.     No current facility-administered medications for this visit.    Allergies as of 11/13/2022 - Review Complete 08/08/2022  Allergen Reaction Noted   Oxycodone-acetaminophen  05/09/2022   Codeine  07/25/2021   Demerol Nausea And Vomiting 03/29/2011   Rosuvastatin  07/25/2021   Tetanus-diphtheria toxoids td  09/21/2021   Topamax [topiramate] Other (See Comments) 07/01/2012   Lamictal [lamotrigine] Rash 03/29/2011    Vitals: LMP 12/18/2005  Last Weight:  Wt Readings from Last 1 Encounters:  08/08/22 200 lb (90.7 kg)   Last Height:   Ht  Readings from Last 1 Encounters:  08/08/22 '5\' 7"'$  (1.702 m)   Exam: NAD, pleasant                  Speech:    Speech is normal;  fluent and spontaneous with normal comprehension.  Cognition:    The patient is oriented to person, place, and time;     recent and remote memory intact;     language fluent;   Assessment/Plan:  Absolutely lovely patient with chronic migraines. Gave her Ajovy samples did NOT work and having chronic migraines again > 8 migraines and > 15 headache days a month. Emgality was a Metallurgist"  11/13/2022: started her on ajovy due to insurance even though emgality was 'life changing: ajovy did not work as Teaching laboratory technician, Financial risk analyst contraindicated, Teaching laboratory technician was Chief Technology Officer. Will leave 2 months emgality samples as she is getting new insurance in [place] she will call us and we will prescribe emgality again and try to get it for her.left samples up front  Meds ordered this encounter  Medications   Galcanezumab-gnlm (EMGALITY) 120 MG/ML SOAJ    Sig: Inject 120 mg into the skin every 30 (thirty) days.    Dispense:  2 mL    Refill:  0   rizatriptan (MAXALT-MLT) 10 MG disintegrating tablet    Sig: Take 1 tablet (10 mg total) by mouth as needed for migraine. May repeat in 2 hours if needed    Dispense:  9 tablet    Refill:  11      Follow up 05/10/2022: Emgality samples have significantly helped. "Life changing".  Baseline headaches were At least 8 moderate to severe migraines a month, at least 15 headache days a month or more. She has had only had slight headache days since starting Emgality. When she does get a migraine it is mild. 4 migraine days a month and <7 total headache days a month. Emgality declined, trying to get Ajovy approved now. Gave her Ajovy samples did NOT work and having chronic migraines again > 8 migraines and > 15 headache days a month.  [email or web address] as needed for migraine/headache - look for a call from [place], also trying to get approved.   MRI brain (with and without) demonstrating; -Stable right frontal subcortical T2 hyperintensity measuring  6 to 7 mm.  Few other punctate foci of bilateral periventricular and subcortical T2 hyperintensities also noted and stable from prior study 2017. Findings are non-specific.  -No acute findings.  No abnormal enhancing lesions.  Overall no change from 04/19/2016.  She sees dr Sharlett Iles yearly for blood work, I don't have those but ensure he checked a TSH sees him again in a few months  Recommended [place]   Cc: Donnajean Lopes, MD,  Donnajean Lopes, MD  Sarina Ill, MD  [facility] [address] [place], [place] [address]  Phone [phone] Fax [phone]  I spent 72 minutes of face-to-face and non-face-to-face time with patient on the  1. Chronic migraine without aura without status migrainosus, not intractable      diagnosis.  This included previsit chart review, lab review, study review, order entry, electronic health record documentation, patient education on the different diagnostic and therapeutic options, counseling and coordination of care, risks and benefits of management, compliance, or risk factor reduction

## 2022-12-25 DIAGNOSIS — H9319 Tinnitus, unspecified ear: Secondary | ICD-10-CM | POA: Diagnosis not present

## 2022-12-25 DIAGNOSIS — H903 Sensorineural hearing loss, bilateral: Secondary | ICD-10-CM | POA: Diagnosis not present

## 2022-12-29 DIAGNOSIS — F3181 Bipolar II disorder: Secondary | ICD-10-CM | POA: Diagnosis not present

## 2022-12-29 DIAGNOSIS — F410 Panic disorder [episodic paroxysmal anxiety] without agoraphobia: Secondary | ICD-10-CM | POA: Diagnosis not present

## 2022-12-29 DIAGNOSIS — F900 Attention-deficit hyperactivity disorder, predominantly inattentive type: Secondary | ICD-10-CM | POA: Diagnosis not present

## 2023-01-11 DIAGNOSIS — R079 Chest pain, unspecified: Secondary | ICD-10-CM | POA: Diagnosis not present

## 2023-01-11 DIAGNOSIS — E785 Hyperlipidemia, unspecified: Secondary | ICD-10-CM | POA: Diagnosis not present

## 2023-01-11 DIAGNOSIS — I10 Essential (primary) hypertension: Secondary | ICD-10-CM | POA: Diagnosis not present

## 2023-03-06 DIAGNOSIS — R232 Flushing: Secondary | ICD-10-CM | POA: Diagnosis not present

## 2023-03-06 DIAGNOSIS — R5383 Other fatigue: Secondary | ICD-10-CM | POA: Diagnosis not present

## 2023-03-06 DIAGNOSIS — R300 Dysuria: Secondary | ICD-10-CM | POA: Diagnosis not present

## 2023-03-06 DIAGNOSIS — G4733 Obstructive sleep apnea (adult) (pediatric): Secondary | ICD-10-CM | POA: Diagnosis not present

## 2023-03-06 DIAGNOSIS — I10 Essential (primary) hypertension: Secondary | ICD-10-CM | POA: Diagnosis not present

## 2023-03-06 DIAGNOSIS — R635 Abnormal weight gain: Secondary | ICD-10-CM | POA: Diagnosis not present

## 2023-03-06 DIAGNOSIS — N390 Urinary tract infection, site not specified: Secondary | ICD-10-CM | POA: Diagnosis not present

## 2023-03-30 DIAGNOSIS — Z0110 Encounter for examination of ears and hearing without abnormal findings: Secondary | ICD-10-CM | POA: Diagnosis not present

## 2023-03-30 DIAGNOSIS — Z79899 Other long term (current) drug therapy: Secondary | ICD-10-CM | POA: Diagnosis not present

## 2023-03-30 DIAGNOSIS — Z8616 Personal history of COVID-19: Secondary | ICD-10-CM | POA: Diagnosis not present

## 2023-03-30 DIAGNOSIS — Z9621 Cochlear implant status: Secondary | ICD-10-CM | POA: Diagnosis not present

## 2023-03-30 DIAGNOSIS — H6121 Impacted cerumen, right ear: Secondary | ICD-10-CM | POA: Diagnosis not present

## 2023-03-30 DIAGNOSIS — H918X3 Other specified hearing loss, bilateral: Secondary | ICD-10-CM | POA: Diagnosis not present

## 2023-03-30 DIAGNOSIS — H90A22 Sensorineural hearing loss, unilateral, left ear, with restricted hearing on the contralateral side: Secondary | ICD-10-CM | POA: Diagnosis not present

## 2023-03-30 DIAGNOSIS — H9121 Sudden idiopathic hearing loss, right ear: Secondary | ICD-10-CM | POA: Diagnosis not present

## 2023-04-05 DIAGNOSIS — H903 Sensorineural hearing loss, bilateral: Secondary | ICD-10-CM | POA: Diagnosis not present

## 2023-04-18 DIAGNOSIS — Z1389 Encounter for screening for other disorder: Secondary | ICD-10-CM | POA: Diagnosis not present

## 2023-04-18 DIAGNOSIS — Z01419 Encounter for gynecological examination (general) (routine) without abnormal findings: Secondary | ICD-10-CM | POA: Diagnosis not present

## 2023-04-18 DIAGNOSIS — Z130 Encounter for screening for diseases of the blood and blood-forming organs and certain disorders involving the immune mechanism: Secondary | ICD-10-CM | POA: Diagnosis not present

## 2023-04-18 DIAGNOSIS — Z1231 Encounter for screening mammogram for malignant neoplasm of breast: Secondary | ICD-10-CM | POA: Diagnosis not present

## 2023-04-18 DIAGNOSIS — Z780 Asymptomatic menopausal state: Secondary | ICD-10-CM | POA: Diagnosis not present

## 2023-06-08 DIAGNOSIS — H90A22 Sensorineural hearing loss, unilateral, left ear, with restricted hearing on the contralateral side: Secondary | ICD-10-CM | POA: Diagnosis not present

## 2023-06-08 DIAGNOSIS — Z9621 Cochlear implant status: Secondary | ICD-10-CM | POA: Diagnosis not present

## 2023-06-08 DIAGNOSIS — Z45321 Encounter for adjustment and management of cochlear device: Secondary | ICD-10-CM | POA: Diagnosis not present

## 2023-06-08 DIAGNOSIS — H918X3 Other specified hearing loss, bilateral: Secondary | ICD-10-CM | POA: Diagnosis not present

## 2023-06-08 DIAGNOSIS — H9121 Sudden idiopathic hearing loss, right ear: Secondary | ICD-10-CM | POA: Diagnosis not present

## 2023-06-08 DIAGNOSIS — R42 Dizziness and giddiness: Secondary | ICD-10-CM | POA: Diagnosis not present

## 2023-08-23 ENCOUNTER — Other Ambulatory Visit (HOSPITAL_COMMUNITY): Payer: Self-pay

## 2023-09-17 DIAGNOSIS — N183 Chronic kidney disease, stage 3 unspecified: Secondary | ICD-10-CM | POA: Diagnosis not present

## 2023-09-24 DIAGNOSIS — R82998 Other abnormal findings in urine: Secondary | ICD-10-CM | POA: Diagnosis not present

## 2023-09-24 DIAGNOSIS — Z0000 Encounter for general adult medical examination without abnormal findings: Secondary | ICD-10-CM | POA: Diagnosis not present

## 2023-09-24 DIAGNOSIS — I10 Essential (primary) hypertension: Secondary | ICD-10-CM | POA: Diagnosis not present

## 2023-10-03 DIAGNOSIS — H903 Sensorineural hearing loss, bilateral: Secondary | ICD-10-CM | POA: Diagnosis not present

## 2023-10-03 DIAGNOSIS — T85898A Other specified complication of other internal prosthetic devices, implants and grafts, initial encounter: Secondary | ICD-10-CM | POA: Diagnosis not present

## 2023-10-03 DIAGNOSIS — H9121 Sudden idiopathic hearing loss, right ear: Secondary | ICD-10-CM | POA: Diagnosis not present

## 2023-10-03 DIAGNOSIS — Z9621 Cochlear implant status: Secondary | ICD-10-CM | POA: Diagnosis not present

## 2023-10-11 DIAGNOSIS — Z9621 Cochlear implant status: Secondary | ICD-10-CM | POA: Diagnosis not present

## 2023-10-11 DIAGNOSIS — H90A22 Sensorineural hearing loss, unilateral, left ear, with restricted hearing on the contralateral side: Secondary | ICD-10-CM | POA: Diagnosis not present

## 2023-10-26 DIAGNOSIS — F3181 Bipolar II disorder: Secondary | ICD-10-CM | POA: Diagnosis not present

## 2023-10-26 DIAGNOSIS — F410 Panic disorder [episodic paroxysmal anxiety] without agoraphobia: Secondary | ICD-10-CM | POA: Diagnosis not present

## 2023-10-26 DIAGNOSIS — Z5181 Encounter for therapeutic drug level monitoring: Secondary | ICD-10-CM | POA: Diagnosis not present

## 2023-10-26 DIAGNOSIS — F900 Attention-deficit hyperactivity disorder, predominantly inattentive type: Secondary | ICD-10-CM | POA: Diagnosis not present

## 2023-10-29 ENCOUNTER — Institutional Professional Consult (permissible substitution): Payer: Self-pay | Admitting: Neurology

## 2023-11-02 DIAGNOSIS — Z4889 Encounter for other specified surgical aftercare: Secondary | ICD-10-CM | POA: Diagnosis not present

## 2023-11-02 DIAGNOSIS — Z48810 Encounter for surgical aftercare following surgery on the sense organs: Secondary | ICD-10-CM | POA: Diagnosis not present

## 2023-11-02 DIAGNOSIS — H9193 Unspecified hearing loss, bilateral: Secondary | ICD-10-CM | POA: Diagnosis not present

## 2023-11-02 DIAGNOSIS — Z9621 Cochlear implant status: Secondary | ICD-10-CM | POA: Diagnosis not present

## 2023-11-02 DIAGNOSIS — Z45321 Encounter for adjustment and management of cochlear device: Secondary | ICD-10-CM | POA: Diagnosis not present

## 2023-11-02 DIAGNOSIS — H90A22 Sensorineural hearing loss, unilateral, left ear, with restricted hearing on the contralateral side: Secondary | ICD-10-CM | POA: Diagnosis not present

## 2023-11-02 DIAGNOSIS — R432 Parageusia: Secondary | ICD-10-CM | POA: Diagnosis not present

## 2023-11-02 DIAGNOSIS — H918X3 Other specified hearing loss, bilateral: Secondary | ICD-10-CM | POA: Diagnosis not present
